# Patient Record
Sex: Female | Born: 1940 | Race: Black or African American | Hispanic: No | Smoking: Never smoker
Health system: Southern US, Community
[De-identification: ages and names within clinical notes are randomized; demographics above are authoritative.]

## PROBLEM LIST (undated history)

## (undated) DIAGNOSIS — G43909 Migraine, unspecified, not intractable, without status migrainosus: Secondary | ICD-10-CM

## (undated) DIAGNOSIS — I10 Essential (primary) hypertension: Secondary | ICD-10-CM

## (undated) DIAGNOSIS — E785 Hyperlipidemia, unspecified: Secondary | ICD-10-CM

## (undated) DIAGNOSIS — K219 Gastro-esophageal reflux disease without esophagitis: Secondary | ICD-10-CM

## (undated) DIAGNOSIS — K449 Diaphragmatic hernia without obstruction or gangrene: Secondary | ICD-10-CM

## (undated) HISTORY — PX: ABDOMINAL HYSTERECTOMY: SHX81

## (undated) HISTORY — PX: APPENDECTOMY: SHX54

## (undated) HISTORY — PX: COLON SURGERY: SHX602

## (undated) HISTORY — PX: ABDOMINAL SURGERY: SHX537

---

## 2014-05-26 ENCOUNTER — Inpatient Hospital Stay (HOSPITAL_COMMUNITY)
Admission: EM | Admit: 2014-05-26 | Discharge: 2014-05-30 | DRG: 103 | Disposition: A | Discharge status: Home / Self Care | Payer: Medicare Other | Attending: Internal Medicine | Admitting: Internal Medicine

## 2014-05-26 ENCOUNTER — Encounter (HOSPITAL_COMMUNITY): Payer: Self-pay | Admitting: Emergency Medicine

## 2014-05-26 ENCOUNTER — Emergency Department (HOSPITAL_COMMUNITY): Payer: Medicare Other

## 2014-05-26 DIAGNOSIS — K219 Gastro-esophageal reflux disease without esophagitis: Secondary | ICD-10-CM | POA: Diagnosis present

## 2014-05-26 DIAGNOSIS — I10 Essential (primary) hypertension: Secondary | ICD-10-CM | POA: Diagnosis present

## 2014-05-26 DIAGNOSIS — G43001 Migraine without aura, not intractable, with status migrainosus: Secondary | ICD-10-CM | POA: Insufficient documentation

## 2014-05-26 DIAGNOSIS — R51 Headache: Secondary | ICD-10-CM

## 2014-05-26 DIAGNOSIS — Z90710 Acquired absence of both cervix and uterus: Secondary | ICD-10-CM

## 2014-05-26 DIAGNOSIS — R519 Headache, unspecified: Secondary | ICD-10-CM

## 2014-05-26 DIAGNOSIS — E785 Hyperlipidemia, unspecified: Secondary | ICD-10-CM | POA: Diagnosis present

## 2014-05-26 DIAGNOSIS — Z882 Allergy status to sulfonamides status: Secondary | ICD-10-CM

## 2014-05-26 DIAGNOSIS — G8929 Other chronic pain: Secondary | ICD-10-CM

## 2014-05-26 DIAGNOSIS — R079 Chest pain, unspecified: Secondary | ICD-10-CM

## 2014-05-26 DIAGNOSIS — R1310 Dysphagia, unspecified: Secondary | ICD-10-CM | POA: Diagnosis present

## 2014-05-26 DIAGNOSIS — I160 Hypertensive urgency: Secondary | ICD-10-CM

## 2014-05-26 DIAGNOSIS — G43009 Migraine without aura, not intractable, without status migrainosus: Secondary | ICD-10-CM | POA: Diagnosis not present

## 2014-05-26 DIAGNOSIS — Z7982 Long term (current) use of aspirin: Secondary | ICD-10-CM

## 2014-05-26 HISTORY — DX: Diaphragmatic hernia without obstruction or gangrene: K44.9

## 2014-05-26 HISTORY — DX: Gastro-esophageal reflux disease without esophagitis: K21.9

## 2014-05-26 HISTORY — DX: Hyperlipidemia, unspecified: E78.5

## 2014-05-26 HISTORY — DX: Migraine, unspecified, not intractable, without status migrainosus: G43.909

## 2014-05-26 HISTORY — DX: Essential (primary) hypertension: I10

## 2014-05-26 LAB — CBC WITH DIFFERENTIAL/PLATELET
BASOS ABS: 0 10*3/uL (ref 0.0–0.1)
BASOS PCT: 0 % (ref 0–1)
Eosinophils Absolute: 0.3 10*3/uL (ref 0.0–0.7)
Eosinophils Relative: 4 % (ref 0–5)
HEMATOCRIT: 36.3 % (ref 36.0–46.0)
HEMOGLOBIN: 11.3 g/dL — AB (ref 12.0–15.0)
Lymphocytes Relative: 43 % (ref 12–46)
Lymphs Abs: 3.5 10*3/uL (ref 0.7–4.0)
MCH: 25.7 pg — ABNORMAL LOW (ref 26.0–34.0)
MCHC: 31.1 g/dL (ref 30.0–36.0)
MCV: 82.5 fL (ref 78.0–100.0)
MONO ABS: 0.7 10*3/uL (ref 0.1–1.0)
Monocytes Relative: 9 % (ref 3–12)
NEUTROS ABS: 3.5 10*3/uL (ref 1.7–7.7)
Neutrophils Relative %: 44 % (ref 43–77)
Platelets: 219 10*3/uL (ref 150–400)
RBC: 4.4 MIL/uL (ref 3.87–5.11)
RDW: 16.5 % — AB (ref 11.5–15.5)
WBC: 8.1 10*3/uL (ref 4.0–10.5)

## 2014-05-26 LAB — I-STAT CHEM 8, ED
BUN: 22 mg/dL (ref 6–23)
CREATININE: 0.9 mg/dL (ref 0.50–1.10)
Calcium, Ion: 1.24 mmol/L (ref 1.13–1.30)
Chloride: 103 mmol/L (ref 96–112)
Glucose, Bld: 106 mg/dL — ABNORMAL HIGH (ref 70–99)
HEMATOCRIT: 40 % (ref 36.0–46.0)
Hemoglobin: 13.6 g/dL (ref 12.0–15.0)
POTASSIUM: 4.3 mmol/L (ref 3.5–5.1)
Sodium: 143 mmol/L (ref 135–145)
TCO2: 20 mmol/L (ref 0–100)

## 2014-05-26 LAB — I-STAT TROPONIN, ED: Troponin i, poc: 0 ng/mL (ref 0.00–0.08)

## 2014-05-26 MED ORDER — NITROGLYCERIN 0.4 MG SL SUBL
0.4000 mg | SUBLINGUAL_TABLET | SUBLINGUAL | Status: DC | PRN
  Administered 2014-05-27: 0.4 mg via SUBLINGUAL
  Filled 2014-05-26: qty 1

## 2014-05-26 MED ORDER — PROMETHAZINE HCL 25 MG/ML IJ SOLN
12.5000 mg | Freq: Once | INTRAMUSCULAR | Status: AC
  Administered 2014-05-27: 12.5 mg via INTRAVENOUS
  Filled 2014-05-26: qty 1

## 2014-05-26 MED ORDER — SODIUM CHLORIDE 0.9 % IV BOLUS (SEPSIS)
1000.0000 mL | Freq: Once | INTRAVENOUS | Status: AC
  Administered 2014-05-27: 1000 mL via INTRAVENOUS

## 2014-05-26 MED ORDER — MORPHINE SULFATE 4 MG/ML IJ SOLN
4.0000 mg | Freq: Once | INTRAMUSCULAR | Status: AC
Start: 2014-05-26 — End: 2014-05-27
  Administered 2014-05-27: 4 mg via INTRAVENOUS
  Filled 2014-05-26: qty 1

## 2014-05-26 NOTE — ED Provider Notes (Signed)
CSN: [id number]     Arrival date & time 05/26/14  2201 History   First MD Initiated Contact with Patient 05/26/14 2259     This chart was scribed for Everlene Balls, MD by Forrestine Him, ED Scribe. This patient was seen in room A10C/A10C and the patient's care was started 11:15 PM.   Chief Complaint  Patient presents with  . Headache   HPI   HPI Comments: Kaitrin Seybold is a 74 y.o. female with a PMHx of GERD, hiatal hernia, and migraines who presents to the Emergency Department complaining of constant, ongoing, non-radiating substernal chest pain x 4 days. Pain is described as burning. Discomfort is exacerbated with eating and swallowing. Ms. Zobel attributes pain to GERD flare up as symptoms feel similar to episodes in the past., She also reports ongoing, constant HA x 2 days. Blood pressure has recently been high, however, pt is currently on medication to help manage readings. Pt mentions new onset, constant, mild R sided shoulder pain. She has tried prescribed pain medication which only provided 2 hours of improvement. She denies any SOB, diaphoresis, or vomiting. PSHx includes abdominal hysterectomy, colon surgery, and abdominal surgery- last in 2004. Pt with known allergies to Sulfa antibiotics.  Past Medical History  Diagnosis Date  . GERD (gastroesophageal reflux disease)   . Hiatal hernia   . Migraine    Past Surgical History  Procedure Laterality Date  . Colon surgery    . Abdominal surgery    . Abdominal hysterectomy     No family history on file. History  Substance Use Topics  . Smoking status: Never Smoker   . Smokeless tobacco: Not on file  . Alcohol Use: No   OB History    No data available     Review of Systems  Constitutional: Negative for fever, chills and diaphoresis.  Respiratory: Negative for cough and shortness of breath.   Cardiovascular: Positive for chest pain.  Gastrointestinal: Negative for vomiting.  Neurological: Positive for headaches. Negative for  weakness and numbness.  All other systems reviewed and are negative.     Allergies  Sulfa antibiotics  Home Medications   Prior to Admission medications   Medication Sig Start Date End Date Taking? Authorizing Provider  amLODipine (NORVASC) 5 MG tablet Take 5 mg by mouth daily.   Yes Historical Provider, MD  aspirin 81 MG tablet Take 81 mg by mouth daily.   Yes Historical Provider, MD  ASPIRIN-CAFFEINE PO Take 1 tablet by mouth every 4 (four) hours as needed. For headache   Yes Historical Provider, MD  metoprolol tartrate (LOPRESSOR) 25 MG tablet Take 25 mg by mouth 2 (two) times daily.   Yes Historical Provider, MD  NIFEdipine (PROCARDIA XL/ADALAT-CC) 90 MG 24 hr tablet Take 90 mg by mouth daily.   Yes Historical Provider, MD  omeprazole (PRILOSEC) 20 MG capsule Take 20 mg by mouth daily.   Yes Historical Provider, MD  polyethylene glycol (MIRALAX / GLYCOLAX) packet Take 17 g by mouth daily as needed for mild constipation.   Yes Historical Provider, MD  simvastatin (ZOCOR) 20 MG tablet Take 20 mg by mouth at bedtime.   Yes Historical Provider, MD  valsartan-hydrochlorothiazide (DIOVAN-HCT) 320-25 MG per tablet Take 1 tablet by mouth daily.   Yes Historical Provider, MD   Triage Vitals: BP 231/93 mmHg  Pulse 65  Temp(Src) 98.4 F (36.9 C)  Resp 20  Wt 154 lb (69.854 kg)  SpO2 97%   Physical Exam  Constitutional: She is  oriented to person, place, and time. She appears well-developed and well-nourished. No distress.  HENT:  Head: Normocephalic and atraumatic.  Eyes: EOM are normal.  Neck: Normal range of motion.  Cardiovascular: Normal rate, regular rhythm and normal heart sounds.   Pulmonary/Chest: Effort normal and breath sounds normal.  Abdominal: Soft. She exhibits no distension. There is no tenderness.  Musculoskeletal: Normal range of motion.  Neurological: She is alert and oriented to person, place, and time. A cranial nerve deficit is present. She exhibits normal muscle  tone.  History of Bells palsy, normal strength and sensation x 4 ext, normal cerebellar testing.  Skin: Skin is warm and dry.  Psychiatric: She has a normal mood and affect. Judgment normal.  Nursing note and vitals reviewed.   ED Course  Procedures (including critical care time)  DIAGNOSTIC STUDIES: Oxygen Saturation is 97% on RA, adequate by my interpretation.    COORDINATION OF CARE: 11:13 PM-Discussed treatment plan with pt at bedside and pt agreed to plan.     Labs Review Labs Reviewed  CBC WITH DIFFERENTIAL/PLATELET - Abnormal; Notable for the following:    Hemoglobin 11.3 (*)    MCH 25.7 (*)    RDW 16.5 (*)    All other components within normal limits  COMPREHENSIVE METABOLIC PANEL - Abnormal; Notable for the following:    Glucose, Bld 104 (*)    Total Protein 5.8 (*)    Albumin 3.1 (*)    GFR calc non Af Amer 68 (*)    GFR calc Af Amer 79 (*)    All other components within normal limits  CBC WITH DIFFERENTIAL/PLATELET - Abnormal; Notable for the following:    Hemoglobin 10.7 (*)    HCT 34.5 (*)    MCH 25.7 (*)    RDW 16.6 (*)    Neutrophils Relative % 37 (*)    Lymphocytes Relative 48 (*)    All other components within normal limits  I-STAT CHEM 8, ED - Abnormal; Notable for the following:    Glucose, Bld 106 (*)    All other components within normal limits  MRSA PCR SCREENING  MRSA PCR SCREENING  TROPONIN I  TROPONIN I  TROPONIN I  URINE RAPID DRUG SCREEN (HOSP PERFORMED)  CBC  I-STAT TROPOININ, ED    Imaging Review Dg Chest 2 View  05/26/2014   CLINICAL DATA:  Patient with mid chest pain since Wednesday evening.  EXAM: CHEST  2 VIEW  COMPARISON:  Chest radiograph 09/02/2013  FINDINGS: Stable cardiac and mediastinal contours. No consolidative pulmonary opacities. No pleural effusion or pneumothorax.  IMPRESSION: No acute cardiopulmonary process.   Electronically Signed   By: Lovey Newcomer M.D.   On: 05/26/2014 23:08     EKG Interpretation   Date/Time:   Monday May 26 2014 22:17:30 EDT Ventricular Rate:  66 PR Interval:  154 QRS Duration: 80 QT Interval:  414 QTC Calculation: 434 R Axis:   79 Text Interpretation:  Normal sinus rhythm Moderate voltage criteria for  LVH, may be normal variant Nonspecific ST abnormality Abnormal ECG  Confirmed by Glynn Octave [phone]) on 05/26/2014 10:57:49 PM      MDM   Final diagnoses:  None   Patient  Presents emergency department for elevated blood pressures, headaches, chest pain. She states her chest pain is consistent with her esophageal pain which she has had for many years. She is currently on medications which does not relieve her pain. She states her blood pressure has fluctuated frequently and  has been as high as 944 systolic. Patient is currently still experiencing headache and chest pain. She will require admission for blood pressure control. Troponin here is negative. Patient was given morphine and Phenergan which she states helps her GERD. She also be given nitroglycerin for her blood pressure.  Her BP continues to be high despite therapy.  I spoke with Dr. Raliegh Ip with [facility] who will give hydralazine for further treatment.  She will be admitted for elevated blood pressure, chest pain and headache.   I personally performed the services described in this documentation, which was scribed in my presence. The recorded information has been reviewed and is accurate.   Everlene Balls, MD 05/27/14 [phone]

## 2014-05-26 NOTE — ED Notes (Signed)
MD at bedside. 

## 2014-05-26 NOTE — ED Notes (Signed)
Pt c/o headache x's 6 days   Also c/o pain in her upper chest st's it is GERD.  C/o pain with swallowing

## 2014-05-27 ENCOUNTER — Encounter (HOSPITAL_COMMUNITY): Payer: Self-pay | Admitting: Internal Medicine

## 2014-05-27 ENCOUNTER — Inpatient Hospital Stay (HOSPITAL_COMMUNITY): Payer: Medicare Other

## 2014-05-27 DIAGNOSIS — R1310 Dysphagia, unspecified: Secondary | ICD-10-CM | POA: Diagnosis present

## 2014-05-27 DIAGNOSIS — G43001 Migraine without aura, not intractable, with status migrainosus: Secondary | ICD-10-CM | POA: Insufficient documentation

## 2014-05-27 DIAGNOSIS — K219 Gastro-esophageal reflux disease without esophagitis: Secondary | ICD-10-CM | POA: Diagnosis present

## 2014-05-27 DIAGNOSIS — R079 Chest pain, unspecified: Secondary | ICD-10-CM | POA: Diagnosis not present

## 2014-05-27 DIAGNOSIS — Z882 Allergy status to sulfonamides status: Secondary | ICD-10-CM | POA: Diagnosis not present

## 2014-05-27 DIAGNOSIS — I10 Essential (primary) hypertension: Secondary | ICD-10-CM | POA: Diagnosis present

## 2014-05-27 DIAGNOSIS — G4452 New daily persistent headache (NDPH): Secondary | ICD-10-CM | POA: Diagnosis not present

## 2014-05-27 DIAGNOSIS — E785 Hyperlipidemia, unspecified: Secondary | ICD-10-CM | POA: Diagnosis present

## 2014-05-27 DIAGNOSIS — Z7982 Long term (current) use of aspirin: Secondary | ICD-10-CM | POA: Diagnosis not present

## 2014-05-27 DIAGNOSIS — G43009 Migraine without aura, not intractable, without status migrainosus: Secondary | ICD-10-CM | POA: Diagnosis present

## 2014-05-27 DIAGNOSIS — R519 Headache, unspecified: Secondary | ICD-10-CM | POA: Diagnosis present

## 2014-05-27 DIAGNOSIS — R51 Headache: Secondary | ICD-10-CM

## 2014-05-27 DIAGNOSIS — I160 Hypertensive urgency: Secondary | ICD-10-CM | POA: Diagnosis present

## 2014-05-27 DIAGNOSIS — Z90710 Acquired absence of both cervix and uterus: Secondary | ICD-10-CM | POA: Diagnosis not present

## 2014-05-27 LAB — CBC WITH DIFFERENTIAL/PLATELET
BASOS ABS: 0 10*3/uL (ref 0.0–0.1)
Basophils Relative: 1 % (ref 0–1)
EOS PCT: 5 % (ref 0–5)
Eosinophils Absolute: 0.3 10*3/uL (ref 0.0–0.7)
HCT: 34.5 % — ABNORMAL LOW (ref 36.0–46.0)
Hemoglobin: 10.7 g/dL — ABNORMAL LOW (ref 12.0–15.0)
LYMPHS ABS: 2.8 10*3/uL (ref 0.7–4.0)
LYMPHS PCT: 48 % — AB (ref 12–46)
MCH: 25.7 pg — AB (ref 26.0–34.0)
MCHC: 31 g/dL (ref 30.0–36.0)
MCV: 82.7 fL (ref 78.0–100.0)
Monocytes Absolute: 0.5 10*3/uL (ref 0.1–1.0)
Monocytes Relative: 9 % (ref 3–12)
Neutro Abs: 2.1 10*3/uL (ref 1.7–7.7)
Neutrophils Relative %: 37 % — ABNORMAL LOW (ref 43–77)
PLATELETS: 210 10*3/uL (ref 150–400)
RBC: 4.17 MIL/uL (ref 3.87–5.11)
RDW: 16.6 % — ABNORMAL HIGH (ref 11.5–15.5)
WBC: 5.7 10*3/uL (ref 4.0–10.5)

## 2014-05-27 LAB — COMPREHENSIVE METABOLIC PANEL
ALBUMIN: 3.1 g/dL — AB (ref 3.5–5.2)
ALK PHOS: 108 U/L (ref 39–117)
ALT: 19 U/L (ref 0–35)
ANION GAP: 5 (ref 5–15)
AST: 25 U/L (ref 0–37)
BILIRUBIN TOTAL: 0.6 mg/dL (ref 0.3–1.2)
BUN: 16 mg/dL (ref 6–23)
CHLORIDE: 109 mmol/L (ref 96–112)
CO2: 26 mmol/L (ref 19–32)
Calcium: 8.8 mg/dL (ref 8.4–10.5)
Creatinine, Ser: 0.83 mg/dL (ref 0.50–1.10)
GFR calc Af Amer: 79 mL/min — ABNORMAL LOW (ref >90)
GFR calc non Af Amer: 68 mL/min — ABNORMAL LOW (ref >90)
Glucose, Bld: 104 mg/dL — ABNORMAL HIGH (ref 70–99)
POTASSIUM: 4.1 mmol/L (ref 3.5–5.1)
Sodium: 140 mmol/L (ref 135–145)
Total Protein: 5.8 g/dL — ABNORMAL LOW (ref 6.0–8.3)

## 2014-05-27 LAB — MRSA PCR SCREENING: MRSA BY PCR: NEGATIVE

## 2014-05-27 LAB — RAPID URINE DRUG SCREEN, HOSP PERFORMED
AMPHETAMINES: NOT DETECTED
Barbiturates: POSITIVE — AB
Benzodiazepines: NOT DETECTED
Cocaine: NOT DETECTED
OPIATES: POSITIVE — AB
Tetrahydrocannabinol: NOT DETECTED

## 2014-05-27 LAB — TROPONIN I
Troponin I: <0.03 ng/mL (ref <0.031)
Troponin I: <0.03 ng/mL (ref <0.031)

## 2014-05-27 MED ORDER — VALSARTAN-HYDROCHLOROTHIAZIDE 320-25 MG PO TABS: 1.0000 | ORAL_TABLET | Freq: Every day | ORAL | Status: DC

## 2014-05-27 MED ORDER — SUMATRIPTAN SUCCINATE 6 MG/0.5ML ~~LOC~~ SOLN
6.0000 mg | Freq: Once | SUBCUTANEOUS | Status: AC
  Administered 2014-05-27: 6 mg via SUBCUTANEOUS
  Filled 2014-05-27: qty 0.5

## 2014-05-27 MED ORDER — HYDROCHLOROTHIAZIDE 25 MG PO TABS
25.0000 mg | ORAL_TABLET | Freq: Every day | ORAL | Status: DC
  Administered 2014-05-27 – 2014-05-30 (×4): 25 mg via ORAL
  Filled 2014-05-27 (×4): qty 1

## 2014-05-27 MED ORDER — METOPROLOL TARTRATE 25 MG PO TABS
25.0000 mg | ORAL_TABLET | Freq: Two times a day (BID) | ORAL | Status: DC
Start: 2014-05-27 — End: 2014-05-29
  Administered 2014-05-27 – 2014-05-29 (×5): 25 mg via ORAL
  Filled 2014-05-27 (×6): qty 1

## 2014-05-27 MED ORDER — SODIUM CHLORIDE 0.9 % IV SOLN
INTRAVENOUS | Status: DC
  Administered 2014-05-27: 18:00:00 via INTRAVENOUS

## 2014-05-27 MED ORDER — ALUM & MAG HYDROXIDE-SIMETH 200-200-20 MG/5ML PO SUSP
30.0000 mL | ORAL | Status: DC | PRN
  Administered 2014-05-27: 30 mL via ORAL
  Filled 2014-05-27: qty 30

## 2014-05-27 MED ORDER — PANTOPRAZOLE SODIUM 40 MG PO TBEC
40.0000 mg | DELAYED_RELEASE_TABLET | Freq: Two times a day (BID) | ORAL | Status: DC
  Administered 2014-05-27 – 2014-05-30 (×7): 40 mg via ORAL
  Filled 2014-05-27 (×7): qty 1

## 2014-05-27 MED ORDER — SUCRALFATE 1 GM/10ML PO SUSP
1.0000 g | Freq: Three times a day (TID) | ORAL | Status: DC
  Administered 2014-05-27 – 2014-05-30 (×12): 1 g via ORAL
  Filled 2014-05-27 (×15): qty 10

## 2014-05-27 MED ORDER — ONDANSETRON HCL 4 MG PO TABS: 4.0000 mg | ORAL_TABLET | Freq: Four times a day (QID) | ORAL | Status: DC | PRN

## 2014-05-27 MED ORDER — ACETAMINOPHEN 650 MG RE SUPP: 650.0000 mg | Freq: Four times a day (QID) | RECTAL | Status: DC | PRN

## 2014-05-27 MED ORDER — AMLODIPINE BESYLATE 5 MG PO TABS
5.0000 mg | ORAL_TABLET | Freq: Every day | ORAL | Status: DC
  Filled 2014-05-27: qty 1

## 2014-05-27 MED ORDER — IRBESARTAN 300 MG PO TABS
300.0000 mg | ORAL_TABLET | Freq: Every day | ORAL | Status: DC
  Filled 2014-05-27: qty 1

## 2014-05-27 MED ORDER — AMLODIPINE BESYLATE 5 MG PO TABS
5.0000 mg | ORAL_TABLET | Freq: Every day | ORAL | Status: DC
  Administered 2014-05-27 – 2014-05-29 (×3): 5 mg via ORAL
  Filled 2014-05-27 (×3): qty 1

## 2014-05-27 MED ORDER — ONDANSETRON HCL 4 MG/2ML IJ SOLN: 4.0000 mg | Freq: Four times a day (QID) | INTRAMUSCULAR | Status: DC | PRN

## 2014-05-27 MED ORDER — NIFEDIPINE ER OSMOTIC RELEASE 90 MG PO TB24: 90.0000 mg | ORAL_TABLET | Freq: Every day | ORAL | Status: DC

## 2014-05-27 MED ORDER — HYDRALAZINE HCL 20 MG/ML IJ SOLN
10.0000 mg | INTRAMUSCULAR | Status: DC | PRN
  Administered 2014-05-27 – 2014-05-29 (×2): 10 mg via INTRAVENOUS
  Filled 2014-05-27 (×2): qty 1

## 2014-05-27 MED ORDER — GI COCKTAIL ~~LOC~~
30.0000 mL | Freq: Three times a day (TID) | ORAL | Status: DC | PRN
  Administered 2014-05-27 – 2014-05-29 (×2): 30 mL via ORAL
  Filled 2014-05-27 (×3): qty 30

## 2014-05-27 MED ORDER — ACETAMINOPHEN 325 MG PO TABS
650.0000 mg | ORAL_TABLET | Freq: Four times a day (QID) | ORAL | Status: DC | PRN
  Administered 2014-05-28 – 2014-05-29 (×4): 650 mg via ORAL
  Filled 2014-05-27 (×4): qty 2

## 2014-05-27 MED ORDER — PANTOPRAZOLE SODIUM 40 MG IV SOLR
40.0000 mg | Freq: Two times a day (BID) | INTRAVENOUS | Status: DC
  Filled 2014-05-27 (×2): qty 40

## 2014-05-27 MED ORDER — SIMVASTATIN 20 MG PO TABS
20.0000 mg | ORAL_TABLET | Freq: Every day | ORAL | Status: DC
  Administered 2014-05-27 – 2014-05-29 (×3): 20 mg via ORAL
  Filled 2014-05-27 (×4): qty 1

## 2014-05-27 MED ORDER — PANTOPRAZOLE SODIUM 40 MG PO TBEC: 40.0000 mg | DELAYED_RELEASE_TABLET | Freq: Every day | ORAL | Status: DC

## 2014-05-27 MED ORDER — HYDRALAZINE HCL 20 MG/ML IJ SOLN
INTRAMUSCULAR | Status: AC
  Filled 2014-05-27: qty 1

## 2014-05-27 MED ORDER — HYDROCHLOROTHIAZIDE 25 MG PO TABS
25.0000 mg | ORAL_TABLET | Freq: Every day | ORAL | Status: DC
  Filled 2014-05-27: qty 1

## 2014-05-27 MED ORDER — IRBESARTAN 300 MG PO TABS
300.0000 mg | ORAL_TABLET | Freq: Every day | ORAL | Status: DC
  Administered 2014-05-27 – 2014-05-30 (×4): 300 mg via ORAL
  Filled 2014-05-27 (×4): qty 1

## 2014-05-27 MED ORDER — HYDRALAZINE HCL 20 MG/ML IJ SOLN
10.0000 mg | Freq: Once | INTRAMUSCULAR | Status: AC
  Administered 2014-05-27: 10 mg via INTRAVENOUS

## 2014-05-27 MED ORDER — MORPHINE SULFATE 2 MG/ML IJ SOLN
2.0000 mg | INTRAMUSCULAR | Status: DC | PRN
  Administered 2014-05-27: 2 mg via INTRAVENOUS
  Filled 2014-05-27: qty 1

## 2014-05-27 NOTE — Progress Notes (Signed)
Dr. Sherral Hammers notified of pt complaining of "really bad headache after that medicine" (imitrex). No new orders received at this time.  Will continue to monitor pt closely.

## 2014-05-27 NOTE — H&P (Signed)
[facility] History and Physical  Mandy Ortiz QQP:[id number] DOB: 1941-01-21 DOA: 05/26/2014  Referring physician: ER physician. PCP: No primary care provider on file. patient follows up at [facility].  Chief Complaint: Chest pain and headache.  HPI: Mandy Ortiz is a 74 y.o. female with history of hypertension, hyperlipidemia, esophageal strictures requiring dilation previously presents to the ER because of chest pain and headache. Patient states she has been having chest pain over the last 5 days which is retrosternal sometimes radiating into her right arm for last 5 days. Pain is mostly burning in sensation no relation to exertion and denies any nausea vomiting productive cough fever chills. Last 24 hours patient also has been having some frontal headache denies any visual symptoms focal deficits. In the ER patient was found her blood pressure more than 712 systolic and has been admitted for further management of her hypertensive urgency. Patient denies having missed her medications.   Review of Systems: As presented in the history of presenting illness, rest negative.  Past Medical History  Diagnosis Date  . GERD (gastroesophageal reflux disease)   . Hiatal hernia   . Migraine   . Hypertension   . Hyperlipidemia    Past Surgical History  Procedure Laterality Date  . Colon surgery    . Abdominal surgery    . Abdominal hysterectomy     Social History:  reports that she has never smoked. She does not have any smokeless tobacco history on file. She reports that she does not drink alcohol or use illicit drugs. Where does patient live home. Can patient participate in ADLs? Yes.  Allergies  Allergen Reactions  . Sulfa Antibiotics     Hives     Family History:  Family History  Problem Relation Age of Onset  . Hypertension Mother   . Hypertension Father   . Hypertension Brother       Prior to Admission medications   Medication Sig Start Date End Date Taking?  Authorizing Provider  amLODipine (NORVASC) 5 MG tablet Take 5 mg by mouth daily.   Yes Historical Provider, MD  aspirin 81 MG tablet Take 81 mg by mouth daily.   Yes Historical Provider, MD  ASPIRIN-CAFFEINE PO Take 1 tablet by mouth every 4 (four) hours as needed. For headache   Yes Historical Provider, MD  metoprolol tartrate (LOPRESSOR) 25 MG tablet Take 25 mg by mouth 2 (two) times daily.   Yes Historical Provider, MD  NIFEdipine (PROCARDIA XL/ADALAT-CC) 90 MG 24 hr tablet Take 90 mg by mouth daily.   Yes Historical Provider, MD  omeprazole (PRILOSEC) 20 MG capsule Take 20 mg by mouth daily.   Yes Historical Provider, MD  polyethylene glycol (MIRALAX / GLYCOLAX) packet Take 17 g by mouth daily as needed for mild constipation.   Yes Historical Provider, MD  simvastatin (ZOCOR) 20 MG tablet Take 20 mg by mouth at bedtime.   Yes Historical Provider, MD  valsartan-hydrochlorothiazide (DIOVAN-HCT) 320-25 MG per tablet Take 1 tablet by mouth daily.   Yes Historical Provider, MD    Physical Exam: Filed Vitals:   05/27/14 0000 05/27/14 0100 05/27/14 0130 05/27/14 0210  BP: 222/69 229/84 230/85 247/89  Pulse:  81 91   Temp:      Resp: 17 15  14   Weight:      SpO2:  99% 96% 97%     General:  Well-developed and nourished.  Eyes: Anicteric no pallor.  ENT: No discharge from the ears eyes nose and mouth.  Neck: No mass felt.  Cardiovascular: S1 and S2 heard.  Respiratory: No rhonchi or crepitations.  Abdomen: Soft nontender bowel sounds present.  Skin: No rash.  Musculoskeletal: No edema.  Psychiatric: Appears normal.  Neurologic: Alert awake oriented to time place and person. Moves all extremities 5 x 5. Perla positive. No facial asymmetry.  Labs on Admission:  Basic Metabolic Panel:  Recent Labs Lab 05/26/14 2243  NA 143  K 4.3  CL 103  GLUCOSE 106*  BUN 22  CREATININE 0.90   Liver Function Tests: No results for input(s): AST, ALT, ALKPHOS, BILITOT, PROT, ALBUMIN  in the last 168 hours. No results for input(s): LIPASE, AMYLASE in the last 168 hours. No results for input(s): AMMONIA in the last 168 hours. CBC:  Recent Labs Lab 05/26/14 2234 05/26/14 2243  WBC 8.1  --   NEUTROABS 3.5  --   HGB 11.3* 13.6  HCT 36.3 40.0  MCV 82.5  --   PLT 219  --    Cardiac Enzymes: No results for input(s): CKTOTAL, CKMB, CKMBINDEX, TROPONINI in the last 168 hours.  BNP (last 3 results) No results for input(s): BNP in the last 8760 hours.  ProBNP (last 3 results) No results for input(s): PROBNP in the last 8760 hours.  CBG: No results for input(s): GLUCAP in the last 168 hours.  Radiological Exams on Admission: Dg Chest 2 View  05/26/2014   CLINICAL DATA:  Patient with mid chest pain since Wednesday evening.  EXAM: CHEST  2 VIEW  COMPARISON:  Chest radiograph 09/02/2013  FINDINGS: Stable cardiac and mediastinal contours. No consolidative pulmonary opacities. No pleural effusion or pneumothorax.  IMPRESSION: No acute cardiopulmonary process.   Electronically Signed   By: Lovey Newcomer M.D.   On: 05/26/2014 23:08    EKG: Independently reviewed. Normal sinus rhythm with nonspecific ST-T changes.  Assessment/Plan Principal Problem:   Hypertensive urgency Active Problems:   Chest pain   Headache   Hyperlipidemia   1. Hypertensive urgency - patient states she has not missed her medications. At this time I have ordered hydralazine 10 mg when necessary and continue home medications. If patient's blood pressure does not improve with hydralazine may need to start Cardene on labetalol infusions. 2. Chest pain - patient does have a history of esophageal strictures. But at this time we'll probably rule out any ACS by cycling cardiac markers checking 2-D echo. I have also ordered to check blood pressure difference in both arms and if that is more than 20 difference and we will get CT angiogram to rule out dissection. I have also place patient on Protonix and GI  cocktail. 3. Headache - probably related to patient's blood pressure. Patient at this time appears nonfocal. If patient's headache does not improve with control of blood pressure will get CT head. 4. Hyperlipidemia - on statin.   DVT Prophylaxis SCDs.  Code Status: Full code.  Family Communication: Family at the bedside.  Disposition Plan: Admit to inpatient.    Camyah Pultz N. [facility] Pager [phone].  If 7PM-7AM, please contact night-coverage [email or web address] Password [email or web address] 05/27/2014, 2:27 AM

## 2014-05-27 NOTE — ED Notes (Signed)
MD (Oni) at bedside. 

## 2014-05-27 NOTE — Progress Notes (Signed)
NURSING PROGRESS NOTE  Mandy Ortiz [id number] Transfer Data: 05/27/2014 6:00 PM Attending Provider: Allie Bossier, MD PCP:No primary care provider on file. Code Status: FULL  Mandy Ortiz is a 74 y.o. female patient transferred from [place] -No acute distress noted.  -No complaints of shortness of breath.  -No complaints of chest pain.    Blood pressure 212/91, pulse 69, temperature 98.2 F (36.8 C), temperature source Oral, resp. rate 18, height 5\' 6"  (1.676 m), weight 68.5 kg (151 lb 0.2 oz), SpO2 97 %.   IV Fluids:  IV in place, occlusive dsg intact without redness, IV cath hand left, condition patent and no redness normal saline.   Allergies:  Sulfa antibiotics  Past Medical History:   has a past medical history of GERD (gastroesophageal reflux disease); Hiatal hernia; Migraine; Hypertension; and Hyperlipidemia.  Past Surgical History:   has past surgical history that includes Colon surgery; Abdominal surgery; and Abdominal hysterectomy.  Skin: intact  Patient/Family orientated to room. Information packet given to patient/family. Admission inpatient armband information verified with patient/family to include name and date of birth and placed on patient arm. Side rails up x 2, fall assessment and education completed with patient/family. Patient/family able to verbalize understanding of risk associated with falls and verbalized understanding to call for assistance before getting out of bed. Call light within reach. Patient/family able to voice and demonstrate understanding of unit orientation instructions.    Will continue to evaluate and treat per MD orders.  Wallie Renshaw, RN

## 2014-05-27 NOTE — Consult Note (Signed)
Reason for Consult: Noncardiac chest pain Referring Physician: [facility]  Mandy Ortiz HPI: This is a 74 year old female admitted for hypertensive urgency.  She presented with chest pain with a systolic BP >182.  Her blood pressure is under better control.  In the past she underwent EGDs at [facility] and dilations were performed.  She started having issues with her esophagus 30 years ago when she was in [place].  Currently she reports a burning sensation in her chest with PO intake.  She notices this more when she swallows Nexium.  This burning sensation is intermittent and she does not know what exacerbates the situation.  She may go long stretches without any symptoms.  Every 2-3 years, on average, she was dilated by her GI physician in [facility], but her physician retired.    Past Medical History  Diagnosis Date  . GERD (gastroesophageal reflux disease)   . Hiatal hernia   . Migraine   . Hypertension   . Hyperlipidemia     Past Surgical History  Procedure Laterality Date  . Colon surgery    . Abdominal surgery    . Abdominal hysterectomy      Family History  Problem Relation Age of Onset  . Hypertension Mother   . Hypertension Father   . Hypertension Brother     Social History:  reports that she has never smoked. She does not have any smokeless tobacco history on file. She reports that she does not drink alcohol or use illicit drugs.  Allergies:  Allergies  Allergen Reactions  . Sulfa Antibiotics     Hives     Medications:  Scheduled: . amLODipine  5 mg Oral Daily  . hydrochlorothiazide  25 mg Oral Daily  . irbesartan  300 mg Oral Daily  . metoprolol tartrate  25 mg Oral BID  . pantoprazole  40 mg Oral BID  . simvastatin  20 mg Oral QHS  . SUMAtriptan  6 mg Subcutaneous Once   Continuous: . sodium chloride Stopped (05/27/14 0230)    Results for orders placed or performed during the hospital encounter of 05/26/14 (from the past 24 hour(s))  CBC  with Differential     Status: Abnormal   Collection Time: 05/26/14 10:34 PM  Result Value Ref Range   WBC 8.1 4.0 - 10.5 K/uL   RBC 4.40 3.87 - 5.11 MIL/uL   Hemoglobin 11.3 (L) 12.0 - 15.0 g/dL   HCT 36.3 36.0 - 46.0 %   MCV 82.5 78.0 - 100.0 fL   MCH 25.7 (L) 26.0 - 34.0 pg   MCHC 31.1 30.0 - 36.0 g/dL   RDW 16.5 (H) 11.5 - 15.5 %   Platelets 219 150 - 400 K/uL   Neutrophils Relative % 44 43 - 77 %   Neutro Abs 3.5 1.7 - 7.7 K/uL   Lymphocytes Relative 43 12 - 46 %   Lymphs Abs 3.5 0.7 - 4.0 K/uL   Monocytes Relative 9 3 - 12 %   Monocytes Absolute 0.7 0.1 - 1.0 K/uL   Eosinophils Relative 4 0 - 5 %   Eosinophils Absolute 0.3 0.0 - 0.7 K/uL   Basophils Relative 0 0 - 1 %   Basophils Absolute 0.0 0.0 - 0.1 K/uL  I-Stat Troponin, ED (not at [facility])     Status: None   Collection Time: 05/26/14 10:41 PM  Result Value Ref Range   Troponin i, poc 0.00 0.00 - 0.08 ng/mL   Comment 3  I-Stat Chem 8, ED     Status: Abnormal   Collection Time: 05/26/14 10:43 PM  Result Value Ref Range   Sodium 143 135 - 145 mmol/L   Potassium 4.3 3.5 - 5.1 mmol/L   Chloride 103 96 - 112 mmol/L   BUN 22 6 - 23 mg/dL   Creatinine, Ser 0.90 0.50 - 1.10 mg/dL   Glucose, Bld 106 (H) 70 - 99 mg/dL   Calcium, Ion 1.24 1.13 - 1.30 mmol/L   TCO2 20 0 - 100 mmol/L   Hemoglobin 13.6 12.0 - 15.0 g/dL   HCT 40.0 36.0 - 46.0 %  MRSA PCR Screening     Status: None   Collection Time: 05/27/14  3:03 AM  Result Value Ref Range   MRSA by PCR NEGATIVE NEGATIVE  Troponin I (q 6hr x 3)     Status: None   Collection Time: 05/27/14  4:14 AM  Result Value Ref Range   Troponin I <0.03 <0.031 ng/mL  Troponin I (q 6hr x 3)     Status: None   Collection Time: 05/27/14 10:35 AM  Result Value Ref Range   Troponin I <0.03 <0.031 ng/mL  Comprehensive metabolic panel     Status: Abnormal   Collection Time: 05/27/14 10:35 AM  Result Value Ref Range   Sodium 140 135 - 145 mmol/L   Potassium 4.1 3.5 - 5.1 mmol/L    Chloride 109 96 - 112 mmol/L   CO2 26 19 - 32 mmol/L   Glucose, Bld 104 (H) 70 - 99 mg/dL   BUN 16 6 - 23 mg/dL   Creatinine, Ser 0.83 0.50 - 1.10 mg/dL   Calcium 8.8 8.4 - 10.5 mg/dL   Total Protein 5.8 (L) 6.0 - 8.3 g/dL   Albumin 3.1 (L) 3.5 - 5.2 g/dL   AST 25 0 - 37 U/L   ALT 19 0 - 35 U/L   Alkaline Phosphatase 108 39 - 117 U/L   Total Bilirubin 0.6 0.3 - 1.2 mg/dL   GFR calc non Af Amer 68 (L) >90 mL/min   GFR calc Af Amer 79 (L) >90 mL/min   Anion gap 5 5 - 15  CBC with Differential/Platelet     Status: Abnormal   Collection Time: 05/27/14 10:35 AM  Result Value Ref Range   WBC 5.7 4.0 - 10.5 K/uL   RBC 4.17 3.87 - 5.11 MIL/uL   Hemoglobin 10.7 (L) 12.0 - 15.0 g/dL   HCT 34.5 (L) 36.0 - 46.0 %   MCV 82.7 78.0 - 100.0 fL   MCH 25.7 (L) 26.0 - 34.0 pg   MCHC 31.0 30.0 - 36.0 g/dL   RDW 16.6 (H) 11.5 - 15.5 %   Platelets 210 150 - 400 K/uL   Neutrophils Relative % 37 (L) 43 - 77 %   Neutro Abs 2.1 1.7 - 7.7 K/uL   Lymphocytes Relative 48 (H) 12 - 46 %   Lymphs Abs 2.8 0.7 - 4.0 K/uL   Monocytes Relative 9 3 - 12 %   Monocytes Absolute 0.5 0.1 - 1.0 K/uL   Eosinophils Relative 5 0 - 5 %   Eosinophils Absolute 0.3 0.0 - 0.7 K/uL   Basophils Relative 1 0 - 1 %   Basophils Absolute 0.0 0.0 - 0.1 K/uL     Dg Chest 2 View  05/26/2014   CLINICAL DATA:  Patient with mid chest pain since Wednesday evening.  EXAM: CHEST  2 VIEW  COMPARISON:  Chest radiograph 09/02/2013  FINDINGS: Stable cardiac and mediastinal contours. No consolidative pulmonary opacities. No pleural effusion or pneumothorax.  IMPRESSION: No acute cardiopulmonary process.   Electronically Signed   By: Lovey Newcomer M.D.   On: 05/26/2014 23:08    ROS:  As stated above in the HPI otherwise negative.  Blood pressure 118/40, pulse 54, temperature 98.5 F (36.9 C), temperature source Oral, resp. rate 15, height 5\' 6"  (1.676 m), weight 68.5 kg (151 lb 0.2 oz), SpO2 98 %.    PE: Gen: NAD, Alert and Oriented HEENT:   [place]/AT, EOMI Neck: Supple, no LAD Lungs: CTA Bilaterally CV: RRR without M/G/R ABM: Soft, NTND, +BS Ext: No C/C/E  Assessment/Plan: 1) Noncardiac chest pain. 2) History of esophageal strictures. 3) GERD.   At this point dysphagia is not a problem for her, but she reports issues with chest burning.  She denies using sucralfate in the past. I will give her a try of the medication to see if this can help her in addition to her Protonix.  Plan: 1) Sucralfate QID suspension. 2) Continue with Protonix.  Muhamad Serano D 05/27/2014, 4:11 PM

## 2014-05-27 NOTE — Progress Notes (Signed)
  Echocardiogram 2D Echocardiogram has been performed.  Diamond Nickel 05/27/2014, 3:21 PM

## 2014-05-27 NOTE — ED Notes (Signed)
RN attempted IV start; 2nd RN to attempted IV start

## 2014-05-27 NOTE — Progress Notes (Addendum)
[place] TEAM 1 - Stepdown/ICU TEAM Progress Note  Mandy Ortiz JAS:[id number] DOB: 11-Nov-1940 DOA: 05/26/2014 PCP: No primary care provider on file.  Admit HPI / Brief Narrative: Mandy Ortiz is a 73 y.o. BF PMHx hypertension, hyperlipidemia, esophageal strictures requiring dilation previously presents to the ER because of chest pain and headache. Patient states she has been having chest pain over the last 5 days which is retrosternal sometimes radiating into her right arm for last 5 days. Pain is mostly burning in sensation no relation to exertion and denies any nausea vomiting productive cough fever chills. Last 24 hours patient also has been having some frontal headache denies any visual symptoms focal deficits. In the ER patient was found her blood pressure more than 419 systolic and has been admitted for further management of her hypertensive urgency. Patient denies having missed her medications.   HPI/Subjective: 4/5 A/O x 4 rates current CP 8/10, negative SOB, Negative    Assessment/Plan: Hypertensive urgency  -BP now controlled on current medication -Continue amlodipine 5 mg daily -Continue HCTZ 25 mg daily, Avapro 300 mg daily -Continue metoprolol 25 mg  BID  Chest pain -Most likely GI in nature; patient with history of strictures and multiple dilations per patient (unable to verify in care everywhere) -Consult GI -Troponin 3 negative -Echocardiogram pending -CT angiogram not necessary; BP in both arms<20 point difference. -Continue GI cocktail -GI consult pending have spoken with Dr. Carol Ada; EGD with dilation of strictures?  Migraine headache -Most likely triggered by patient's hypertensive urgency -Imitrex shot 1 ADDENDUM; patient with increasing headache pain with appropriate therapy obtain CT head without contrast, evaluate for CVA vs mass.  Hyperlipidemia  -continue Zocor 20 mg daily -Obtain lipid panel   Code Status: FULL Family Communication: no  family present at time of exam Disposition Plan: Resolution chest pain   Consultants: Dr. Carol Ada (GI) pending    Procedure/Significant Events:    Culture NA  Antibiotics: NA  DVT prophylaxis: SCD   Devices NA   LINES / TUBES:  NA    Continuous Infusions: . sodium chloride Stopped (05/27/14 0230)    Objective: VITAL SIGNS: Temp: 98.5 F (36.9 C) (04/05 1203) Temp Source: Oral (04/05 1203) BP: 118/40 mmHg (04/05 1203) Pulse Rate: 54 (04/05 1200) SPO2; FIO2:   Intake/Output Summary (Last 24 hours) at 05/27/14 1353 Last data filed at 05/27/14 1200  Gross per 24 hour  Intake   1480 ml  Output    200 ml  Net   1280 ml     Exam: General: A/O x 4 sitting comfortably in bed ( although rates CP 8/10),No acute respiratory distress Lungs: Clear to auscultation bilaterally without wheezes or crackles Cardiovascular: Regular rate and rhythm without murmur gallop or rub normal S1 and S2 Abdomen: Mildly tender Epigastric region, nondistended, soft, bowel sounds positive, no rebound, no ascites, no appreciable mass Extremities: No significant cyanosis, clubbing, or edema bilateral lower extremities  Data Reviewed: Basic Metabolic Panel:  Recent Labs Lab 05/26/14 2243 05/27/14 1035  NA 143 140  K 4.3 4.1  CL 103 109  CO2  --  26  GLUCOSE 106* 104*  BUN 22 16  CREATININE 0.90 0.83  CALCIUM  --  8.8   Liver Function Tests:  Recent Labs Lab 05/27/14 1035  AST 25  ALT 19  ALKPHOS 108  BILITOT 0.6  PROT 5.8*  ALBUMIN 3.1*   No results for input(s): LIPASE, AMYLASE in the last 168 hours. No results for input(s): AMMONIA  in the last 168 hours. CBC:  Recent Labs Lab 05/26/14 2234 05/26/14 2243 05/27/14 1035  WBC 8.1  --  5.7  NEUTROABS 3.5  --  2.1  HGB 11.3* 13.6 10.7*  HCT 36.3 40.0 34.5*  MCV 82.5  --  82.7  PLT 219  --  210   Cardiac Enzymes:  Recent Labs Lab 05/27/14 0414 05/27/14 1035  TROPONINI <0.03 <0.03   BNP  (last 3 results) No results for input(s): BNP in the last 8760 hours.  ProBNP (last 3 results) No results for input(s): PROBNP in the last 8760 hours.  CBG: No results for input(s): GLUCAP in the last 168 hours.  Recent Results (from the past 240 hour(s))  MRSA PCR Screening     Status: None   Collection Time: 05/27/14  3:03 AM  Result Value Ref Range Status   MRSA by PCR NEGATIVE NEGATIVE Final    Comment:        The GeneXpert MRSA Assay (FDA approved for NASAL specimens only), is one component of a comprehensive MRSA colonization surveillance program. It is not intended to diagnose MRSA infection nor to guide or monitor treatment for MRSA infections.      Studies:  Recent x-ray studies have been reviewed in detail by the Attending Physician  Scheduled Meds:  Scheduled Meds: . amLODipine  5 mg Oral Daily  . hydrochlorothiazide  25 mg Oral Daily  . irbesartan  300 mg Oral Daily  . metoprolol tartrate  25 mg Oral BID  . NIFEdipine  90 mg Oral Daily  . pantoprazole  40 mg Oral BID  . simvastatin  20 mg Oral QHS    Time spent on care of this patient: 40 mins   Mandy Ortiz, Geraldo Docker , MD  [facility] Office  [phone] Pager [phone]  On-Call/Text Page:      [email or web address]      password [email or web address]  If 7PM-7AM, please contact night-coverage [email or web address] Password [email or web address] 05/27/2014, 1:53 PM   LOS: 0 days   Care during the described time interval was provided by me .  I have reviewed this patient's available data, including medical history, events of note, physical examination, radiology studies and test results as part of my evaluation  Dia Crawford, MD [phone] Pager

## 2014-05-27 NOTE — Progress Notes (Addendum)
INITIAL NUTRITION ASSESSMENT  DOCUMENTATION CODES Per approved criteria  -Not Applicable   INTERVENTION: -Downgrade diet to dysphagia 2 consistency with chopped meats for ease of intake  NUTRITION DIAGNOSIS: Inadequate oral intake related to dysphagia as evidenced by diet hx PTA.   Goal: Pt will meet >90% of estimated nutritional needs  Monitor:  PO/supplement intake, labs, weight changes, I/O's  Reason for Assessment: MST=3  74 y.o. female  Admitting Dx: Hypertensive urgency  Mandy Ortiz is a 74 y.o. female with history of hypertension, hyperlipidemia, esophageal strictures requiring dilation previously presents to the ER because of chest pain and headache. Patient states she has been having chest pain over the last 5 days which is retrosternal sometimes radiating into her right arm for last 5 days. Pain is mostly burning in sensation no relation to exertion and denies any nausea vomiting productive cough fever chills. Last 24 hours patient also has been having some frontal headache denies any visual symptoms focal deficits. In the ER patient was found her blood pressure more than 710 systolic and has been admitted for further management of her hypertensive urgency. Patient denies having missed her medications.   ASSESSMENT: Pt admitted with hypertensive urgency.  Pt endorses poor appetite and weight loss approximately 2 weeks PTA. She reports difficulty swallowing, reveals food often "gets stuck" in her throat and causes a burning sensation. She has a longstanding hx of acid reflux. Pt reports that she tries to stay away from acidic and fried foods, as they aggravate her acid reflux. She also admits that she tries to drink more liquids with meals to help food go down and this method usually help ease the symptoms. She consumed 75% of her breakfast this morning- all except the sausage patty, which she revealed was too difficult for her to eat. She is agreeable to a diet downgrade,  reporting that chopped meats will likely help her to eat with less difficulty.  She reports UBW of 155#. She estimates she has lost 4# in the past two weeks due to decreased oral intake.  Noted pt with mild muscle depletion in lower extremities. Pt reports that she has been less active due to weakness and having plantar fascitis. Despite this, she tries to be very active when she is well and participates in many athletic activities at the local senior center (ex. Chair volleyball and bocce).  Educated pt on importance of good PO intake to promote healing.  Labs reviewed. Glucose: 106.   Nutrition Focused Physical Exam:  Subcutaneous Fat:  Orbital Region: WDL Upper Arm Region: WDL Thoracic and Lumbar Region: WDL  Muscle:  Temple Region: WDL Clavicle Bone Region: WDL Clavicle and Acromion Bone Region: WDL Scapular Bone Region: WDL Dorsal Hand: mild depletion Patellar Region: mild depletion Anterior Thigh Region: mild depletion Posterior Calf Region: WDL  Edema: none present  Height: Ht Readings from Last 1 Encounters:  05/27/14 5\' 6"  (1.676 m)    Weight: Wt Readings from Last 1 Encounters:  05/27/14 151 lb 0.2 oz (68.5 kg)    Ideal Body Weight: 130#  % Ideal Body Weight: 116%  Wt Readings from Last 10 Encounters:  05/27/14 151 lb 0.2 oz (68.5 kg)    Usual Body Weight: 155#  % Usual Body Weight: 97%  BMI:  Body mass index is 24.39 kg/(m^2). Normal weight range  Estimated Nutritional Needs: Kcal: 1700-1900 Protein: 75-85 grams Fluid: 1.7-1.9 L  Skin: WDL  Diet Order: Diet Heart Room service appropriate?: Yes; Fluid consistency:: Thin  EDUCATION NEEDS: -  Education needs addressed   Intake/Output Summary (Last 24 hours) at 05/27/14 0927 Last data filed at 05/27/14 0745  Gross per 24 hour  Intake   1000 ml  Output    200 ml  Net    800 ml    Last BM: 05/26/14  Labs:   Recent Labs Lab 05/26/14 2243  NA 143  K 4.3  CL 103  BUN 22  CREATININE 0.90   GLUCOSE 106*    CBG (last 3)  No results for input(s): GLUCAP in the last 72 hours.  Scheduled Meds: . amLODipine  5 mg Oral Daily  . hydrochlorothiazide  25 mg Oral Daily  . irbesartan  300 mg Oral Daily  . metoprolol tartrate  25 mg Oral BID  . NIFEdipine  90 mg Oral Daily  . pantoprazole (PROTONIX) IV  40 mg Intravenous Q12H  . simvastatin  20 mg Oral QHS    Continuous Infusions: . sodium chloride Stopped (05/27/14 0230)    Past Medical History  Diagnosis Date  . GERD (gastroesophageal reflux disease)   . Hiatal hernia   . Migraine   . Hypertension   . Hyperlipidemia     Past Surgical History  Procedure Laterality Date  . Colon surgery    . Abdominal surgery    . Abdominal hysterectomy      Mandy Ortiz A. Mandy Ortiz, RD, LDN, CDE Pager: [phone] After hours Pager: [phone]

## 2014-05-27 NOTE — Progress Notes (Signed)
Report received from [facility] on [facility] for patient to be transferred into 5w09

## 2014-05-27 NOTE — Care Management Note (Addendum)
    Page 1 of 1   05/29/2014     4:09:26 PM CARE MANAGEMENT NOTE 05/29/2014  Patient:  Mandy Ortiz, Mandy Ortiz   Account Number:  [id number]  Date Initiated:  05/27/2014  Documentation initiated by:  Elissa Hefty  Subjective/Objective Assessment:   adm w htn urgency     Action/Plan:   lives alone   Anticipated DC Date:     Anticipated DC Plan:  [place]         Choice offered to / List presented to:             Status of service:   Medicare Important Message given?  YES (If response is "NO", the following Medicare IM given date fields will be blank) Date Medicare IM given:  05/29/2014 Medicare IM given by:  Carles Collet Date Additional Medicare IM given:   Additional Medicare IM given by:    Discharge Disposition:    Per UR Regulation:  Reviewed for med. necessity/level of care/duration of stay  If discussed at [place] of Stay Meetings, dates discussed:    Comments:

## 2014-05-28 ENCOUNTER — Inpatient Hospital Stay (HOSPITAL_COMMUNITY): Payer: Medicare Other

## 2014-05-28 DIAGNOSIS — G4452 New daily persistent headache (NDPH): Secondary | ICD-10-CM

## 2014-05-28 LAB — SEDIMENTATION RATE: Sed Rate: 20 mm/hr (ref 0–22)

## 2014-05-28 MED ORDER — VALPROATE SODIUM 500 MG/5ML IV SOLN
1.0000 g | Freq: Once | INTRAVENOUS | Status: AC
  Administered 2014-05-28: 1000 mg via INTRAVENOUS
  Filled 2014-05-28: qty 10

## 2014-05-28 MED ORDER — ENOXAPARIN SODIUM 40 MG/0.4ML ~~LOC~~ SOLN
40.0000 mg | SUBCUTANEOUS | Status: DC
Start: 2014-05-28 — End: 2014-05-30
  Administered 2014-05-28 – 2014-05-29 (×2): 40 mg via SUBCUTANEOUS
  Filled 2014-05-28 (×3): qty 0.4

## 2014-05-28 MED ORDER — GADOBENATE DIMEGLUMINE 529 MG/ML IV SOLN
15.0000 mL | Freq: Once | INTRAVENOUS | Status: AC | PRN
  Administered 2014-05-28: 15 mL via INTRAVENOUS

## 2014-05-28 NOTE — Progress Notes (Signed)
Unassigned patient Subjective: Patient reports that she is feeling better this morning. No chest pain since last night. Currently reports headache.  Objective: Vital signs in last 24 hours: Temp:  [98.2 F (36.8 C)-98.7 F (37.1 C)] 98.2 F (36.8 C) (04/06 0540) Pulse Rate:  [54-69] 60 (04/06 0946) Resp:  [15-27] 18 (04/06 0540) BP: (118-212)/(40-91) 168/62 mmHg (04/06 0946) SpO2:  [95 %-99 %] 99 % (04/06 0540) Weight:  [158 lb 4.8 oz (71.804 kg)] 158 lb 4.8 oz (71.804 kg) (04/05 1801) Last BM Date: 05/26/14  Intake/Output from previous day: 04/05 0701 - 04/06 0700 In: 1020 [P.O.:1020] Out: 1450 [Urine:1450] Intake/Output this shift: Total I/O In: 200 [P.O.:200] Out: -   PE: Gen: NAD, Alert and Oriented HEENT: [place]/AT, EOMI Neck: Supple, no LAD Lungs: CTA Bilaterally CV: RRR without M/G/R ABM: Soft, NTND, +BS Ext: No C/C/E  Lab Results:  Recent Labs  05/26/14 2234 05/26/14 2243 05/27/14 1035  WBC 8.1  --  5.7  HGB 11.3* 13.6 10.7*  HCT 36.3 40.0 34.5*  PLT 219  --  210   BMET  Recent Labs  05/26/14 2243 05/27/14 1035  NA 143 140  K 4.3 4.1  CL 103 109  CO2  --  26  GLUCOSE 106* 104*  BUN 22 16  CREATININE 0.90 0.83  CALCIUM  --  8.8   LFT  Recent Labs  05/27/14 1035  PROT 5.8*  ALBUMIN 3.1*  AST 25  ALT 19  ALKPHOS 108  BILITOT 0.6    Studies/Results: Dg Chest 2 View  05/26/2014   CLINICAL DATA:  Patient with mid chest pain since Wednesday evening.  EXAM: CHEST  2 VIEW  COMPARISON:  Chest radiograph 09/02/2013  FINDINGS: Stable cardiac and mediastinal contours. No consolidative pulmonary opacities. No pleural effusion or pneumothorax.  IMPRESSION: No acute cardiopulmonary process.   Electronically Signed   By: Lovey Newcomer M.D.   On: 05/26/2014 23:08   Ct Head Wo Contrast  05/27/2014   CLINICAL DATA:  Persistent headache and hypertension  EXAM: CT HEAD WITHOUT CONTRAST  TECHNIQUE: Contiguous axial images were obtained from the base of the  skull through the vertex without intravenous contrast.  COMPARISON:  March 07, 2014  FINDINGS: The ventricles are normal in size and configuration. There is no mass, hemorrhage, extra-axial fluid collection, or midline shift. No focal gray-white compartment lesions are identified. No acute infarct evident. Calcification in the right basal ganglia region is stable. The bony calvarium appears intact. The mastoid air cells are clear. There is probable cerumen in each external auditory canal.  IMPRESSION: No intracranial mass, hemorrhage, or focal gray - white compartment lesions/ acute appearing infarct. Calcification in the right basal ganglia region is stable and felt to have benign etiology. Apparent cerumen in each external auditory canal.   Electronically Signed   By: Lowella Grip III M.D.   On: 05/27/2014 20:41    Medications:  I have reviewed the patient's current medications. Scheduled: . amLODipine  5 mg Oral Daily  . hydrochlorothiazide  25 mg Oral Daily  . irbesartan  300 mg Oral Daily  . metoprolol tartrate  25 mg Oral BID  . pantoprazole  40 mg Oral BID  . simvastatin  20 mg Oral QHS  . sucralfate  1 g Oral TID WC & HS   Continuous: . sodium chloride 10 mL/hr at 05/27/14 1825   Assessment/Plan:  Chest pain: 74 year old woman with past medical history of hiatal hernia, GERD, hypertension and migraines presented with  burning chest pain that was felt to be noncardiac. GI was consulted to evaluate for possible GI related chest pain. She was tried on a GI cocktail in addition to Carafate and Protonix. Plan -Continue with Protonix 40 mg twice daily by mouth. -Continue with a GI cocktail 3 times a day prn -Continue with Carafate 3 times a day with meals -She'll probably benefit from EGD as outpatient. > Follow-up can be arranged with Dr. Collene Mares in 2-3 weeks.  Hypertension: BP has improved to 168/62  Headache: Being evaluated by primary team.  Case discussed with my attending,  Dr. Collene Mares   LOS: 1 day   Signed:  Jessee Avers, MD PGY-3 Internal Medicine Teaching Service Pager: [phone] 05/28/2014, 10:52 AM   Patient is in radiology at the present time. She has had resolution of her chest pain with Carafate. Will sign off now. Please call as needed.  Juanita Craver, MD 05/614.

## 2014-05-28 NOTE — Progress Notes (Signed)
Radiology did not notify about CT scan results, Informed Dr. Baltazar Najjar to review CT scan results.

## 2014-05-28 NOTE — Progress Notes (Signed)
PATIENT DETAILS Name: Mandy Ortiz Age: 74 y.o. Sex: female Date of Birth: 07/24/1940 Admit Date: 05/26/2014 Admitting Physician Rise Patience, MD PCP:No primary care provider on file.  Subjective: Continues to have headache. No further chest pain.  Assessment/Plan: Principal Problem:   Hypertensive urgency: Blood pressure much better-but with still some fluctuations.Continue with HCTZ, Avapro and metoprolol. Will not make any additional changes today, will follow and reevaluate tomorrow.  Active Problems:   Headache: Ongoing since Sunday, has never resolved-does have a history of migraine headaches, but no response to Imitrex. Current headaches seem to be different than her usual migraine headaches. Given age, will check ESR/CRP, MRI brain. One dose of IV Depakote. If still having headaches with these measures, will formally consult neurology.    Chest pain: Suspect GI etiology. Cardiac enzymes negative. 2-D echocardiogram showed preserved ejection fraction without any wall motion abnormality. Continue PPI and Carafate. Follow    Hyperlipidemia: Continue statin      Gastroesophageal reflux disease: Continue with Carafate and PPI.  Disposition: Remain inpatient-home in the next 1-2 days once workup is completed   Antibiotics:  None    Anti-infectives    None      DVT Prophylaxis: Prophylactic Lovenox   Code Status: Full code  Family Communication None at bedside  Procedures:  None  CONSULTS:  None  MEDICATIONS: Scheduled Meds: . amLODipine  5 mg Oral Daily  . hydrochlorothiazide  25 mg Oral Daily  . irbesartan  300 mg Oral Daily  . metoprolol tartrate  25 mg Oral BID  . pantoprazole  40 mg Oral BID  . simvastatin  20 mg Oral QHS  . sucralfate  1 g Oral TID WC & HS  . valproate sodium  1 g Intravenous Once   Continuous Infusions: . sodium chloride 10 mL/hr at 05/27/14 1825   PRN Meds:.acetaminophen **OR** acetaminophen, gi cocktail,  hydrALAZINE, ondansetron **OR** ondansetron (ZOFRAN) IV    PHYSICAL EXAM: Vital signs in last 24 hours: Filed Vitals:   05/27/14 2338 05/28/14 0540 05/28/14 0646 05/28/14 0946  BP: 139/46 146/59  168/62  Pulse: 65 54 56 60  Temp:  98.2 F (36.8 C)    TempSrc:  Oral    Resp:  18    Height:      Weight:      SpO2:  99%      Weight change: 1.95 kg (4 lb 4.8 oz) Filed Weights   05/26/14 2219 05/27/14 0210 05/27/14 1801  Weight: 69.854 kg (154 lb) 68.5 kg (151 lb 0.2 oz) 71.804 kg (158 lb 4.8 oz)   Body mass index is 25.56 kg/(m^2).   Gen Exam: Awake and alert with clear speech.   Neck: Supple, No JVD.   Chest: B/L Clear.   CVS: S1 S2 Regular, no murmurs.  Abdomen: soft, BS +, non tender, non distended.  Extremities: no edema, lower extremities warm to touch. Neurologic: Non Focal.   Skin: No Rash.   Wounds: N/A.    Intake/Output from previous day:  Intake/Output Summary (Last 24 hours) at 05/28/14 1255 Last data filed at 05/28/14 0924  Gross per 24 hour  Intake    740 ml  Output   1250 ml  Net   -510 ml     LAB RESULTS: CBC  Recent Labs Lab 05/26/14 2234 05/26/14 2243 05/27/14 1035  WBC 8.1  --  5.7  HGB 11.3* 13.6 10.7*  HCT 36.3 40.0 34.5*  PLT 219  --  210  MCV 82.5  --  82.7  MCH 25.7*  --  25.7*  MCHC 31.1  --  31.0  RDW 16.5*  --  16.6*  LYMPHSABS 3.5  --  2.8  MONOABS 0.7  --  0.5  EOSABS 0.3  --  0.3  BASOSABS 0.0  --  0.0    Chemistries   Recent Labs Lab 05/26/14 2243 05/27/14 1035  NA 143 140  K 4.3 4.1  CL 103 109  CO2  --  26  GLUCOSE 106* 104*  BUN 22 16  CREATININE 0.90 0.83  CALCIUM  --  8.8    CBG: No results for input(s): GLUCAP in the last 168 hours.  GFR Estimated Creatinine Clearance: 61.3 mL/min (by C-G formula based on Cr of 0.83).  Coagulation profile No results for input(s): INR, PROTIME in the last 168 hours.  Cardiac Enzymes  Recent Labs Lab 05/27/14 0414 05/27/14 1035 05/27/14 1502  TROPONINI  <0.03 <0.03 <0.03    Invalid input(s): POCBNP No results for input(s): DDIMER in the last 72 hours. No results for input(s): HGBA1C in the last 72 hours. No results for input(s): CHOL, HDL, LDLCALC, TRIG, CHOLHDL, LDLDIRECT in the last 72 hours. No results for input(s): TSH, T4TOTAL, T3FREE, THYROIDAB in the last 72 hours.  Invalid input(s): FREET3 No results for input(s): VITAMINB12, FOLATE, FERRITIN, TIBC, IRON, RETICCTPCT in the last 72 hours. No results for input(s): LIPASE, AMYLASE in the last 72 hours.  Urine Studies No results for input(s): UHGB, CRYS in the last 72 hours.  Invalid input(s): UACOL, UAPR, USPG, UPH, UTP, UGL, UKET, UBIL, UNIT, UROB, ULEU, UEPI, UWBC, URBC, UBAC, CAST, UCOM, BILUA  MICROBIOLOGY: Recent Results (from the past 240 hour(s))  MRSA PCR Screening     Status: None   Collection Time: 05/27/14  3:03 AM  Result Value Ref Range Status   MRSA by PCR NEGATIVE NEGATIVE Final    Comment:        The GeneXpert MRSA Assay (FDA approved for NASAL specimens only), is one component of a comprehensive MRSA colonization surveillance program. It is not intended to diagnose MRSA infection nor to guide or monitor treatment for MRSA infections.     RADIOLOGY STUDIES/RESULTS: Dg Chest 2 View  05/26/2014   CLINICAL DATA:  Patient with mid chest pain since Wednesday evening.  EXAM: CHEST  2 VIEW  COMPARISON:  Chest radiograph 09/02/2013  FINDINGS: Stable cardiac and mediastinal contours. No consolidative pulmonary opacities. No pleural effusion or pneumothorax.  IMPRESSION: No acute cardiopulmonary process.   Electronically Signed   By: Drew  Davis M.D.   On: 05/26/2014 23:08   Ct Head Wo Contrast  05/27/2014   CLINICAL DATA:  Persistent headache and hypertension  EXAM: CT HEAD WITHOUT CONTRAST  TECHNIQUE: Contiguous axial images were obtained from the base of the skull through the vertex without intravenous contrast.  COMPARISON:  March 07, 2014  FINDINGS: The  ventricles are normal in size and configuration. There is no mass, hemorrhage, extra-axial fluid collection, or midline shift. No focal gray-white compartment lesions are identified. No acute infarct evident. Calcification in the right basal ganglia region is stable. The bony calvarium appears intact. The mastoid air cells are clear. There is probable cerumen in each external auditory canal.  IMPRESSION: No intracranial mass, hemorrhage, or focal gray - white compartment lesions/ acute appearing infarct. Calcification in the right basal ganglia region is stable and felt to have benign etiology. Apparent cerumen in each external auditory canal.     Electronically Signed   By: Lowella Grip III M.D.   On: 05/27/2014 20:41    Oren Binet, MD  [facility] Pager:[phone]  If 7PM-7AM, please contact night-coverage [email or web address] Password [email or web address] 05/28/2014, 12:55 PM   LOS: 1 day

## 2014-05-29 DIAGNOSIS — R51 Headache: Secondary | ICD-10-CM

## 2014-05-29 LAB — C-REACTIVE PROTEIN: CRP: <0.5 mg/dL — ABNORMAL LOW (ref <0.60)

## 2014-05-29 MED ORDER — KETOROLAC TROMETHAMINE 30 MG/ML IJ SOLN
30.0000 mg | Freq: Once | INTRAMUSCULAR | Status: AC
Start: 2014-05-29 — End: 2014-05-29
  Administered 2014-05-29: 30 mg via INTRAVENOUS
  Filled 2014-05-29: qty 1

## 2014-05-29 MED ORDER — DIPHENHYDRAMINE HCL 50 MG/ML IJ SOLN
12.5000 mg | Freq: Once | INTRAMUSCULAR | Status: AC
  Administered 2014-05-29: 12.5 mg via INTRAVENOUS
  Filled 2014-05-29: qty 1

## 2014-05-29 MED ORDER — PROCHLORPERAZINE EDISYLATE 5 MG/ML IJ SOLN
10.0000 mg | Freq: Once | INTRAMUSCULAR | Status: AC
  Administered 2014-05-29: 10 mg via INTRAVENOUS
  Filled 2014-05-29: qty 2

## 2014-05-29 MED ORDER — PROCHLORPERAZINE EDISYLATE 5 MG/ML IJ SOLN
10.0000 mg | Freq: Four times a day (QID) | INTRAMUSCULAR | Status: DC | PRN
  Filled 2014-05-29: qty 2

## 2014-05-29 MED ORDER — AMLODIPINE BESYLATE 10 MG PO TABS
10.0000 mg | ORAL_TABLET | Freq: Every day | ORAL | Status: DC
  Administered 2014-05-30: 10 mg via ORAL
  Filled 2014-05-29: qty 1

## 2014-05-29 MED ORDER — METOPROLOL TARTRATE 25 MG PO TABS
25.0000 mg | ORAL_TABLET | Freq: Two times a day (BID) | ORAL | Status: DC
  Administered 2014-05-29 – 2014-05-30 (×2): 25 mg via ORAL
  Filled 2014-05-29 (×3): qty 1

## 2014-05-29 MED ORDER — DIPHENHYDRAMINE HCL 50 MG/ML IJ SOLN: 12.5000 mg | Freq: Four times a day (QID) | INTRAMUSCULAR | Status: DC | PRN

## 2014-05-29 NOTE — Progress Notes (Signed)
PATIENT DETAILS Name: Mandy Ortiz Age: 74 y.o. Sex: female Date of Birth: 1941/01/20 Admit Date: 05/26/2014 Admitting Physician Rise Patience, MD PCP:No primary care provider on file.  Subjective: Continues to have headache. No further chest pain.  Assessment/Plan: Principal Problem:   Hypertensive urgency: Blood pressure much better-but with still some fluctuations.Continue with HCTZ, Avapro and metoprolol.Increase Amlodipine to 10 mg. Follow and titrate accordingly  Active Problems:   Headache: Ongoing since Sunday, has never resolved-does have a history of migraine headaches, but no response to Imitrex. Current headaches seem to be different than her usual migraine headaches. ESR only at 20. MRI brain negative. No response to 1 gm IV Depakote. Have consulted Neurology.     Chest pain: Suspect GI etiology. Cardiac enzymes negative. 2-D echocardiogram showed preserved ejection fraction without any wall motion abnormality. Continue PPI and Carafate. Follow    Hyperlipidemia: Continue statin      Gastroesophageal reflux disease: Continue with Carafate and PPI.  Disposition: Remain inpatient-home in the next 1-2 days once workup is completed   Antibiotics:  None    Anti-infectives    None      DVT Prophylaxis: Prophylactic Lovenox   Code Status: Full code  Family Communication None at bedside  Procedures:  None  CONSULTS:  None  MEDICATIONS: Scheduled Meds: . amLODipine  5 mg Oral Daily  . enoxaparin (LOVENOX) injection  40 mg Subcutaneous Q24H  . hydrochlorothiazide  25 mg Oral Daily  . irbesartan  300 mg Oral Daily  . metoprolol tartrate  25 mg Oral BID  . pantoprazole  40 mg Oral BID  . simvastatin  20 mg Oral QHS  . sucralfate  1 g Oral TID WC & HS   Continuous Infusions: . sodium chloride 10 mL/hr at 05/27/14 1825   PRN Meds:.acetaminophen **OR** acetaminophen, prochlorperazine **AND** diphenhydrAMINE, gi cocktail, hydrALAZINE,  ondansetron **OR** ondansetron (ZOFRAN) IV    PHYSICAL EXAM: Vital signs in last 24 hours: Filed Vitals:   05/28/14 1419 05/28/14 2214 05/29/14 0546 05/29/14 0956  BP: 136/50 146/47 167/54 144/53  Pulse: 56 51 49 79  Temp: 99.3 F (37.4 C) 98.2 F (36.8 C) 98.2 F (36.8 C)   TempSrc: Oral Oral Oral   Resp: 18 18 16    Height:      Weight:      SpO2: 96% 97% 97%     Weight change:  Filed Weights   05/26/14 2219 05/27/14 0210 05/27/14 1801  Weight: 69.854 kg (154 lb) 68.5 kg (151 lb 0.2 oz) 71.804 kg (158 lb 4.8 oz)   Body mass index is 25.56 kg/(m^2).   Gen Exam: Awake and alert with clear speech.   Neck: Supple, No JVD.   Chest: B/L Clear.   CVS: S1 S2 Regular, no murmurs.  Abdomen: soft, BS +, non tender, non distended.  Extremities: no edema, lower extremities warm to touch. Neurologic: Non Focal.   Skin: No Rash.   Wounds: N/A.    Intake/Output from previous day:  Intake/Output Summary (Last 24 hours) at 05/29/14 1219 Last data filed at 05/29/14 1132  Gross per 24 hour  Intake 768.17 ml  Output   1150 ml  Net -381.83 ml     LAB RESULTS: CBC  Recent Labs Lab 05/26/14 2234 05/26/14 2243 05/27/14 1035  WBC 8.1  --  5.7  HGB 11.3* 13.6 10.7*  HCT 36.3 40.0 34.5*  PLT 219  --  210  MCV 82.5  --  82.7  MCH 25.7*  --  25.7*  MCHC 31.1  --  31.0  RDW 16.5*  --  16.6*  LYMPHSABS 3.5  --  2.8  MONOABS 0.7  --  0.5  EOSABS 0.3  --  0.3  BASOSABS 0.0  --  0.0    Chemistries   Recent Labs Lab 05/26/14 2243 05/27/14 1035  NA 143 140  K 4.3 4.1  CL 103 109  CO2  --  26  GLUCOSE 106* 104*  BUN 22 16  CREATININE 0.90 0.83  CALCIUM  --  8.8    CBG: No results for input(s): GLUCAP in the last 168 hours.  GFR Estimated Creatinine Clearance: 61.3 mL/min (by C-G formula based on Cr of 0.83).  Coagulation profile No results for input(s): INR, PROTIME in the last 168 hours.  Cardiac Enzymes  Recent Labs Lab 05/27/14 0414 05/27/14 1035  05/27/14 1502  TROPONINI <0.03 <0.03 <0.03    Invalid input(s): POCBNP No results for input(s): DDIMER in the last 72 hours. No results for input(s): HGBA1C in the last 72 hours. No results for input(s): CHOL, HDL, LDLCALC, TRIG, CHOLHDL, LDLDIRECT in the last 72 hours. No results for input(s): TSH, T4TOTAL, T3FREE, THYROIDAB in the last 72 hours.  Invalid input(s): FREET3 No results for input(s): VITAMINB12, FOLATE, FERRITIN, TIBC, IRON, RETICCTPCT in the last 72 hours. No results for input(s): LIPASE, AMYLASE in the last 72 hours.  Urine Studies No results for input(s): UHGB, CRYS in the last 72 hours.  Invalid input(s): UACOL, UAPR, USPG, UPH, UTP, UGL, UKET, UBIL, UNIT, UROB, ULEU, UEPI, UWBC, URBC, UBAC, CAST, UCOM, BILUA  MICROBIOLOGY: Recent Results (from the past 240 hour(s))  MRSA PCR Screening     Status: None   Collection Time: 05/27/14  3:03 AM  Result Value Ref Range Status   MRSA by PCR NEGATIVE NEGATIVE Final    Comment:        The GeneXpert MRSA Assay (FDA approved for NASAL specimens only), is one component of a comprehensive MRSA colonization surveillance program. It is not intended to diagnose MRSA infection nor to guide or monitor treatment for MRSA infections.     RADIOLOGY STUDIES/RESULTS: Dg Chest 2 View  05/26/2014   CLINICAL DATA:  Patient with mid chest pain since Wednesday evening.  EXAM: CHEST  2 VIEW  COMPARISON:  Chest radiograph 09/02/2013  FINDINGS: Stable cardiac and mediastinal contours. No consolidative pulmonary opacities. No pleural effusion or pneumothorax.  IMPRESSION: No acute cardiopulmonary process.   Electronically Signed   By: Lovey Newcomer M.D.   On: 05/26/2014 23:08   Ct Head Wo Contrast  05/27/2014   CLINICAL DATA:  Persistent headache and hypertension  EXAM: CT HEAD WITHOUT CONTRAST  TECHNIQUE: Contiguous axial images were obtained from the base of the skull through the vertex without intravenous contrast.  COMPARISON:  March 07, 2014  FINDINGS: The ventricles are normal in size and configuration. There is no mass, hemorrhage, extra-axial fluid collection, or midline shift. No focal gray-white compartment lesions are identified. No acute infarct evident. Calcification in the right basal ganglia region is stable. The bony calvarium appears intact. The mastoid air cells are clear. There is probable cerumen in each external auditory canal.  IMPRESSION: No intracranial mass, hemorrhage, or focal gray - white compartment lesions/ acute appearing infarct. Calcification in the right basal ganglia region is stable and felt to have benign etiology. Apparent cerumen in each external auditory canal.   Electronically Signed   By: Gwyndolyn Saxon  Jasmine December III M.D.   On: 05/27/2014 20:41   Mr Jeri Cos JH Contrast  05/28/2014   CLINICAL DATA:  Persistent headache for 3 days. The current headache a is different than her typical migraines. Chest pain extending into the right upper extremity.  EXAM: MRI HEAD WITHOUT AND WITH CONTRAST  TECHNIQUE: Multiplanar, multiecho pulse sequences of the brain and surrounding structures were obtained without and with intravenous contrast.  CONTRAST:  41m MULTIHANCE GADOBENATE DIMEGLUMINE 529 MG/ML IV SOLN  COMPARISON:  CT head without contrast 05/27/2014.  FINDINGS: The diffusion-weighted images demonstrate no evidence for acute or subacute infarction. No acute hemorrhage or mass lesion is present. Scattered periventricular and predominantly subcortical T2 hyperintensities are evident bilaterally. 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 The ventricles are of normal size. No significant extra-axial fluid collection is present.  Flow is present in the major intracranial arteries. The globes and orbits are intact.  A polyp or mucous retention cyst is noted at the floor of the left maxillary sinus. There is mild mucosal thickening in both maxillary sinuses. Mild mucosal thickening is scattered  throughout the ethmoid air cells and in the inferior left frontal sinus. There are no fluid levels. Minimal fluid is present in the mastoid air cells bilaterally. No obstructing at nasopharyngeal lesion is evident.  And 8 mm fluid collection is noted anterior to the right TMJ, likely a synovial cyst.  Postcontrast images demonstrate no pathologic enhancement. The skullbase is unremarkable. Midline structures are within normal limits  IMPRESSION: 1. No acute or focal lesion to explain the patient's acute headache. 2. Scattered periventricular and predominantly subcortical T2 hyperintensities bilaterally likely reflect the sequela of chronic microvascular ischemia. 3. Mild sinus disease as described. 4. 8 mm cystic lesion anterior to the right TMJ likely represents a synovial cyst associated with degenerative change.   Electronically Signed   By: CSan MorelleM.D.   On: 05/28/2014 17:40    GOren Binet MD  [facility] Pager:[phone] If 7PM-7AM, please contact night-coverage [email or web address] Password [email or web address] 05/29/2014, 12:19 PM   LOS: 2 days

## 2014-05-29 NOTE — Consult Note (Signed)
Neurology Consultation Reason for Consult:  headache Referring Physician: Edgardo Roys  CC:  headache  History is obtained from: patient  HPI: Mandy Ortiz is a 74 y.o. female  With a history of migraines who presented with hypertension  and chest pain as well as headache. She states that she was having a typical migraine when she was admitted. This resolved, but since yesterday she has had return of headache. Typically when she goes to bed and wakes up the next day it is gone and in that way the current headache is different from her typical migraines , however otherwise it is similar. It is associated with photophobia. She denies nausea or vomiting. She denies diplopia.      ROS: A 14 point ROS was performed and is negative except as noted in the HPI.   Past Medical History  Diagnosis Date  . GERD (gastroesophageal reflux disease)   . Hiatal hernia   . Migraine   . Hypertension   . Hyperlipidemia     Family History:  no history of migraine  Social History: Tob:  denies  Exam: Current vital signs: BP 167/54 mmHg  Pulse 49  Temp(Src) 98.2 F (36.8 C) (Oral)  Resp 16  Ht 5' 6" (1.676 m)  Wt 71.804 kg (158 lb 4.8 oz)  BMI 25.56 kg/m2  SpO2 97% Vital signs in last 24 hours: Temp:  [98.2 F (36.8 C)-99.3 F (37.4 C)] 98.2 F (36.8 C) (04/07 0546) Pulse Rate:  [49-56] 49 (04/07 0546) Resp:  [16-18] 16 (04/07 0546) BP: (136-167)/(47-54) 167/54 mmHg (04/07 0546) SpO2:  [96 %-97 %] 97 % (04/07 0546)  Physical Exam  Constitutional: Appears well-developed and well-nourished.  Psych: Affect appropriate to situation Eyes: No scleral injection HENT: No OP obstrucion Head: Normocephalic.  Cardiovascular: Normal rate and regular rhythm.  Respiratory: Effort normal and breath sounds normal to anterior ascultation GI: Soft.  No distension. There is no tenderness.  Skin: WDI  Neuro: Mental Status: Patient is awake, alert, oriented to person, place, month, year, and  situation. Patient is able to give a clear and coherent history. No signs of aphasia or neglect Cranial Nerves: II: Visual Fields are full. Pupils are equal, round, and reactive to light.   III,IV, VI: EOMI without ptosis or diploplia.  V: Facial sensation is symmetric to temperature VII: Facial movement is symmetric.  VIII: hearing is intact to voice X: Uvula elevates symmetrically XI: Shoulder shrug is symmetric. XII: tongue is midline without atrophy or fasciculations.  Motor: Tone is normal. Bulk is normal. 5/5 strength was present in all four extremities.  Sensory: Sensation is symmetric to light touch and temperature in the arms and legs. Cerebellar: FNF  intact bilaterally         I have reviewed labs in epic and the results pertinent to this consultation are: ESR nml  I have reviewed the images obtained: MRI brain - no acute findings  Impression: 74 yo F with likely migraine headache.   Recommendations: 1) compazine/benadryl/toradol. This has been ordered.  2) will follow.    Roland Rack, MD [facility] [phone]  If 7pm- 7am, please page neurology on call as listed in [place].

## 2014-05-29 NOTE — Progress Notes (Signed)
Medicare Important Message given?  YES (If response is "NO", the following Medicare IM given date fields will be blank) Date Medicare IM given:  05/29/14 Medicare IM given by:  Tomi Bamberger

## 2014-05-30 MED ORDER — AMLODIPINE BESYLATE 10 MG PO TABS: 5.0000 mg | ORAL_TABLET | Freq: Every day | ORAL | Status: DC

## 2014-05-30 MED ORDER — METOPROLOL TARTRATE 12.5 MG HALF TABLET
12.5000 mg | ORAL_TABLET | Freq: Two times a day (BID) | ORAL | Status: DC
  Filled 2014-05-30: qty 1

## 2014-05-30 MED ORDER — METOPROLOL TARTRATE 25 MG PO TABS: 12.5000 mg | ORAL_TABLET | Freq: Two times a day (BID) | ORAL | Status: DC

## 2014-05-30 MED ORDER — AMLODIPINE BESYLATE 10 MG PO TABS: 10.0000 mg | ORAL_TABLET | Freq: Every day | ORAL | Status: AC

## 2014-05-30 MED ORDER — TRIAMTERENE-HCTZ 37.5-25 MG PO TABS
1.0000 | ORAL_TABLET | Freq: Every day | ORAL | Status: DC
  Filled 2014-05-30: qty 1

## 2014-05-30 MED ORDER — IRBESARTAN 300 MG PO TABS: 300.0000 mg | ORAL_TABLET | Freq: Every day | ORAL | Status: DC

## 2014-05-30 MED ORDER — PANTOPRAZOLE SODIUM 40 MG PO TBEC: 40.0000 mg | DELAYED_RELEASE_TABLET | Freq: Every day | ORAL | Status: AC

## 2014-05-30 MED ORDER — SIMVASTATIN 20 MG PO TABS: 20.0000 mg | ORAL_TABLET | Freq: Every day | ORAL | Status: AC

## 2014-05-30 MED ORDER — SUCRALFATE 1 GM/10ML PO SUSP: 1.0000 g | Freq: Three times a day (TID) | ORAL | Status: DC

## 2014-05-30 MED ORDER — TRIAMTERENE-HCTZ 37.5-25 MG PO TABS: 1.0000 | ORAL_TABLET | Freq: Every day | ORAL | Status: DC

## 2014-05-30 NOTE — Progress Notes (Signed)
  Pharmacy Discharge Medication Therapy Review   Total Number of meds on admission 9 (polypharmacy > 10 meds)  Indications for all medications: [x]  Yes       []  No  Adherence Review  []  Excellent (no doses missed/week)     [x]  Good (no more than 1 dose missed/week)     []  Partial (2-3 doses missed/week)     []  Poor (>3 doses missed/week)  Total number of high risk medications: 0 (Anticoagulants, Dual antiplatelets, oral Antihyperglycemic agents,Insulins, Antipsychotics, Anti-Seizure meds, Inhalers, HF/ACS meds, Antibiotics and HIV medications)   Assessment: (Medication related problems)  Intervention  YES NO  Explanation   Indications      Medication without noted indication []  [x]     Indication without noted medication [x]  []   Pt has been experiencing migraines PTA and while inpatient.  Neuro consulted, pt has received multiple different meds in an attempt to resolve.  No meds for migraines on discharge, but migraines may have been related to elevated BP.  Pt states she is no longer experiencing a migraine/headache today.   Duplicate therapy []  [x]    Efficacy      Suboptimal drug or dose selection [x]  []  Noted incorrect dose of amlodipine on DC summary. Discussed w/ provider and DC summary was updated.  Insufficient dose/duration []  [x]    Failure to receive therapy  (Rx not filled) []  [x]     Safety      Adverse drug event []  [x]     Drug interaction []  [x]     Excessive dose/duration []  [x]    High-risk medications []  [x]    Compliance     Underuse []  [x]     Overuse []  [x]    Other pertinent pharmacist counseling [x]  []   Discussed change in dose of amlodipine and metoprolol. Discussed change of PPI, taking on empty stomach, and lifestyle recommendations for reflux.    Total number of new medications upon discharge: 4  Time:  Time spent preparing for discharge counseling: 45 min Time spent counseling patient: 15 min   PLAN:  Consider the following at  discharge/Recommendations discussed with provider - Continue amlodipine 10mg  daily on discharge instead of lower PTA dose of 5mg  daily - I have thoroughly reviewed discharge meds w/ pt and changes from PTA meds - I separated her meds into separate bags so she will not continue taking any of her PTA meds that were discontinued on discharge  Drucie Opitz, PharmD Clinical Pharmacy Resident Pager: [phone] 05/30/2014 11:15 AM

## 2014-05-30 NOTE — Progress Notes (Signed)
NURSING PROGRESS NOTE  Mandy Ortiz [id number] Discharge Data: 05/30/2014 1:55 PM Attending Provider: Jonetta Osgood, MD CNO:[id number], MD     Tera Partridge to be D/C'd Home per MD order.  Discussed with the patient the After Visit Summary and all questions fully answered. All IV's discontinued with no bleeding noted. All belongings returned to patient for patient to take home.   Last Vital Signs:  Blood pressure 166/52, pulse 65, temperature 97.9 F (36.6 C), temperature source Oral, resp. rate 18, height 5\' 6"  (1.676 m), weight 71.804 kg (158 lb 4.8 oz), SpO2 98 %.  Discharge Medication List   Medication List    STOP taking these medications        ASPIRIN-CAFFEINE PO     NIFEdipine 90 MG 24 hr tablet  Commonly known as:  PROCARDIA XL/ADALAT-CC     omeprazole 20 MG capsule  Commonly known as:  PRILOSEC     valsartan-hydrochlorothiazide 320-25 MG per tablet  Commonly known as:  DIOVAN-HCT      TAKE these medications        amLODipine 10 MG tablet  Commonly known as:  NORVASC  Take 1 tablet (10 mg total) by mouth daily.     aspirin 81 MG tablet  Take 81 mg by mouth daily.     irbesartan 300 MG tablet  Commonly known as:  AVAPRO  Take 1 tablet (300 mg total) by mouth daily.     metoprolol tartrate 25 MG tablet  Commonly known as:  LOPRESSOR  Take 0.5 tablets (12.5 mg total) by mouth 2 (two) times daily.     pantoprazole 40 MG tablet  Commonly known as:  PROTONIX  Take 1 tablet (40 mg total) by mouth daily.     polyethylene glycol packet  Commonly known as:  MIRALAX / GLYCOLAX  Take 17 g by mouth daily as needed for mild constipation.     simvastatin 20 MG tablet  Commonly known as:  ZOCOR  Take 1 tablet (20 mg total) by mouth at bedtime.     sucralfate 1 GM/10ML suspension  Commonly known as:  CARAFATE  Take 10 mLs (1 g total) by mouth 4 (four) times daily -  with meals and at bedtime.     triamterene-hydrochlorothiazide 37.5-25 MG per tablet   Commonly known as:  MAXZIDE-25  Take 1 tablet by mouth daily.

## 2014-05-30 NOTE — Discharge Summary (Addendum)
PATIENT DETAILS Name: Mandy Ortiz Age: 74 y.o. Sex: female Date of Birth: 1941-01-26 MRN: [id number]. Admitting Physician: Rise Patience, MD WUJ:[email or web address],WGNFA, MD  Admit Date: 05/26/2014 Discharge date: 05/30/2014  Recommendations for Outpatient Follow-up:  1. Changes made to antihypertensive regimen-please optimize at next follow-up 2. If continues to have intermittent headaches, please refer to neurology  3. Age-appropriate general health maintenance  PRIMARY DISCHARGE DIAGNOSIS:  Principal Problem:   Hypertensive urgency Active Problems:   Chest pain   Headache   Hyperlipidemia   Migraine without aura and with status migrainosus, not intractable      PAST MEDICAL HISTORY: Past Medical History  Diagnosis Date  . GERD (gastroesophageal reflux disease)   . Hiatal hernia   . Migraine   . Hypertension   . Hyperlipidemia     DISCHARGE MEDICATIONS: Current Discharge Medication List    START taking these medications   Details  irbesartan (AVAPRO) 300 MG tablet Take 1 tablet (300 mg total) by mouth daily. Qty: 30 tablet, Refills: 0    pantoprazole (PROTONIX) 40 MG tablet Take 1 tablet (40 mg total) by mouth daily. Qty: 60 tablet, Refills: 0    sucralfate (CARAFATE) 1 GM/10ML suspension Take 10 mLs (1 g total) by mouth 4 (four) times daily -  with meals and at bedtime. Qty: 420 mL, Refills: 0    triamterene-hydrochlorothiazide (MAXZIDE-25) 37.5-25 MG per tablet Take 1 tablet by mouth daily. Qty: 30 tablet, Refills: 0      CONTINUE these medications which have CHANGED   Details  amLODipine (NORVASC) 10 MG tablet Take 1 tablet (10 mg total) by mouth daily. Qty: 30 tablet, Refills: 0    metoprolol tartrate (LOPRESSOR) 25 MG tablet Take 0.5 tablets (12.5 mg total) by mouth 2 (two) times daily. Qty: 30 tablet, Refills: 0    simvastatin (ZOCOR) 20 MG tablet Take 1 tablet (20 mg total) by mouth at bedtime. Qty: 30 tablet, Refills: 0      CONTINUE these  medications which have NOT CHANGED   Details  aspirin 81 MG tablet Take 81 mg by mouth daily.    polyethylene glycol (MIRALAX / GLYCOLAX) packet Take 17 g by mouth daily as needed for mild constipation.      STOP taking these medications     ASPIRIN-CAFFEINE PO      NIFEdipine (PROCARDIA XL/ADALAT-CC) 90 MG 24 hr tablet      omeprazole (PRILOSEC) 20 MG capsule      valsartan-hydrochlorothiazide (DIOVAN-HCT) 320-25 MG per tablet         ALLERGIES:   Allergies  Allergen Reactions  . Sulfa Antibiotics     Hives     BRIEF HPI:  See H&P, Labs, Consult and Test reports for all details in brief, patient is a 74 y.o. female with history of hypertension, hyperlipidemia, esophageal strictures requiring dilation previously presented to the ER because of chest pain and headache. She was found to have uncontrolled hypertension, and admitted for further evaluation and treatment  CONSULTATIONS:   GI and neurology  PERTINENT RADIOLOGIC STUDIES: Dg Chest 2 View  05/26/2014   CLINICAL DATA:  Patient with mid chest pain since Wednesday evening.  EXAM: CHEST  2 VIEW  COMPARISON:  Chest radiograph 09/02/2013  FINDINGS: Stable cardiac and mediastinal contours. No consolidative pulmonary opacities. No pleural effusion or pneumothorax.  IMPRESSION: No acute cardiopulmonary process.   Electronically Signed   By: Lovey Newcomer M.D.   On: 05/26/2014 23:08   Ct Head Wo Contrast  05/27/2014  CLINICAL DATA:  Persistent headache and hypertension  EXAM: CT HEAD WITHOUT CONTRAST  TECHNIQUE: Contiguous axial images were obtained from the base of the skull through the vertex without intravenous contrast.  COMPARISON:  March 07, 2014  FINDINGS: The ventricles are normal in size and configuration. There is no mass, hemorrhage, extra-axial fluid collection, or midline shift. No focal gray-white compartment lesions are identified. No acute infarct evident. Calcification in the right basal ganglia region is stable.  The bony calvarium appears intact. The mastoid air cells are clear. There is probable cerumen in each external auditory canal.  IMPRESSION: No intracranial mass, hemorrhage, or focal gray - white compartment lesions/ acute appearing infarct. Calcification in the right basal ganglia region is stable and felt to have benign etiology. Apparent cerumen in each external auditory canal.   Electronically Signed   By: Lowella Grip III M.D.   On: 05/27/2014 20:41   Mr Jeri Cos TA Contrast  05/28/2014   CLINICAL DATA:  Persistent headache for 3 days. The current headache a is different than her typical migraines. Chest pain extending into the right upper extremity.  EXAM: MRI HEAD WITHOUT AND WITH CONTRAST  TECHNIQUE: Multiplanar, multiecho pulse sequences of the brain and surrounding structures were obtained without and with intravenous contrast.  CONTRAST:  55m MULTIHANCE GADOBENATE DIMEGLUMINE 529 MG/ML IV SOLN  COMPARISON:  CT head without contrast 05/27/2014.  FINDINGS: The diffusion-weighted images demonstrate no evidence for acute or subacute infarction. No acute hemorrhage or mass lesion is present. Scattered periventricular and predominantly subcortical T2 hyperintensities are evident bilaterally. 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 The ventricles are of normal size. No significant extra-axial fluid collection is present.  Flow is present in the major intracranial arteries. The globes and orbits are intact.  A polyp or mucous retention cyst is noted at the floor of the left maxillary sinus. There is mild mucosal thickening in both maxillary sinuses. Mild mucosal thickening is scattered throughout the ethmoid air cells and in the inferior left frontal sinus. There are no fluid levels. Minimal fluid is present in the mastoid air cells bilaterally. No obstructing at nasopharyngeal lesion is evident.  And 8 mm fluid collection is noted anterior to the right TMJ, likely a synovial  cyst.  Postcontrast images demonstrate no pathologic enhancement. The skullbase is unremarkable. Midline structures are within normal limits  IMPRESSION: 1. No acute or focal lesion to explain the patient's acute headache. 2. Scattered periventricular and predominantly subcortical T2 hyperintensities bilaterally likely reflect the sequela of chronic microvascular ischemia. 3. Mild sinus disease as described. 4. 8 mm cystic lesion anterior to the right TMJ likely represents a synovial cyst associated with degenerative change.   Electronically Signed   By: CSan MorelleM.D.   On: 05/28/2014 17:40     PERTINENT LAB RESULTS: CBC:  Recent Labs  05/27/14 1035  WBC 5.7  HGB 10.7*  HCT 34.5*  PLT 210   CMET CMP     Component Value Date/Time   NA 140 05/27/2014 1035   K 4.1 05/27/2014 1035   CL 109 05/27/2014 1035   CO2 26 05/27/2014 1035   GLUCOSE 104* 05/27/2014 1035   BUN 16 05/27/2014 1035   CREATININE 0.83 05/27/2014 1035   CALCIUM 8.8 05/27/2014 1035   PROT 5.8* 05/27/2014 1035   ALBUMIN 3.1* 05/27/2014 1035   AST 25 05/27/2014 1035   ALT 19 05/27/2014 1035   ALKPHOS 108 05/27/2014 1035   BILITOT 0.6 05/27/2014  [place] 05/27/2014 1035   GFRAA 79* 05/27/2014 1035    GFR Estimated Creatinine Clearance: 61.3 mL/min (by C-G formula based on Cr of 0.83). No results for input(s): LIPASE, AMYLASE in the last 72 hours.  Recent Labs  05/27/14 1035 05/27/14 1502  TROPONINI <0.03 <0.03   Invalid input(s): POCBNP No results for input(s): DDIMER in the last 72 hours. No results for input(s): HGBA1C in the last 72 hours. No results for input(s): CHOL, HDL, LDLCALC, TRIG, CHOLHDL, LDLDIRECT in the last 72 hours. No results for input(s): TSH, T4TOTAL, T3FREE, THYROIDAB in the last 72 hours.  Invalid input(s): FREET3 No results for input(s): VITAMINB12, FOLATE, FERRITIN, TIBC, IRON, RETICCTPCT in the last 72 hours. Coags: No results for input(s): INR in the  last 72 hours.  Invalid input(s): PT Microbiology: Recent Results (from the past 240 hour(s))  MRSA PCR Screening     Status: None   Collection Time: 05/27/14  3:03 AM  Result Value Ref Range Status   MRSA by PCR NEGATIVE NEGATIVE Final    Comment:        The GeneXpert MRSA Assay (FDA approved for NASAL specimens only), is one component of a comprehensive MRSA colonization surveillance program. It is not intended to diagnose MRSA infection nor to guide or monitor treatment for MRSA infections.      BRIEF HOSPITAL COURSE:   Principal Problem:   Hypertensive urgency: Likely secondary to severe headaches. Patient was admitted, changes were made to her antihypertensive medication regimen. Currently her blood pressure is moderately well controlled. Suspect we will need to gradually continue to optimize her antihypertensive regimen, that could be done in the outpatient setting. She is stable to be discharged home later today. Please see medication list for antihypertensive regimen.  Active Problems:   Headache: Suspect worsening migraine headache as the etiology. Underwent CT head that was negative, because of persistent headache underwent a MRI of the brain that was also negative. ESR only at 20, CRP was negative-headache was not consistent with temporal atelectasis well. Subsequently treated with 1 g of Depakote without any improvement. No response to Imitrex as well. Subsequently neurology was consulted, given a cocktail of NSAIDs, Compazine and Benadryl with complete resolution of headache. Given uncontrolled hypertension, would need to use NSAIDs sparingly. Have instructed patient to try Tylenol first, if no response then to try over-the-counter ibuprofen. However if she were to have persistent or worsening headaches, she would need referral to neurology, or starting prophylaxis for migraines.  Chest pain: Likely GI etiology given significant gastroesophageal reflux use. Cardiac enzymes  is negative, echocardiogram showed preserved ejection fraction without any wall motion abnormality. No further chest pain since admission. Continue PPI and Carafate on discharge. If chest pain reoccurs, can consider outpatient GI eval or cardiogenic evaluation for stress test.  Dyslipidemia: Continue statin  Gastroesophageal reflux disease: Seen by gastroenterology during this hospital stay. Started on Carafate and PPI he did significantly improved. No symptoms of reflux at the time of discharge.  TODAY-DAY OF DISCHARGE:  Subjective:   Mandy Ortiz today has no headache,no chest abdominal pain,no new weakness tingling or numbness, feels much better wants to go home today.  Objective:   Blood pressure 166/52, pulse 65, temperature 97.9 F (36.6 C), temperature source Oral, resp. rate 18, height 5' 6"  (1.676 m), weight 71.804 kg (158 lb 4.8 oz), SpO2 98 %.  Intake/Output Summary (Last 24 hours) at 05/30/14 1029 Last data filed at 05/30/14 1002  Gross per 24  hour  Intake    462 ml  Output   1650 ml  Net  -1188 ml   Filed Weights   05/26/14 2219 05/27/14 0210 05/27/14 1801  Weight: 69.854 kg (154 lb) 68.5 kg (151 lb 0.2 oz) 71.804 kg (158 lb 4.8 oz)    Exam Awake Alert, Oriented *3, No new F.N deficits, Normal affect [place].AT,PERRAL Supple Neck,No JVD, No cervical lymphadenopathy appriciated.  Symmetrical Chest wall movement, Good air movement bilaterally, CTAB RRR,No Gallops,Rubs or new Murmurs, No Parasternal Heave +ve B.Sounds, Abd Soft, Non tender, No organomegaly appriciated, No rebound -guarding or rigidity. No Cyanosis, Clubbing or edema, No new Rash or bruise  DISCHARGE CONDITION: Stable  DISPOSITION: Home  DISCHARGE INSTRUCTIONS:    Activity:  As tolerated  Diet recommendation: Heart Healthy diet  Discharge Instructions    Call MD for:  severe uncontrolled pain    Complete by:  As directed      Diet - low sodium heart healthy    Complete by:  As directed       Increase activity slowly    Complete by:  As directed            Follow-up Information    Follow up with Lujean Amel, MD On 06/09/2014.   Specialty:  Family Medicine   Why:  APPT AT 11:45 AM   Contact information:   [place] [address] [phone]      Total Time spent on discharge equals 45 minutes.  SignedOren Binet 05/30/2014 10:29 AM

## 2014-06-24 ENCOUNTER — Other Ambulatory Visit: Payer: Self-pay | Admitting: Internal Medicine

## 2014-07-30 ENCOUNTER — Other Ambulatory Visit: Payer: Self-pay | Admitting: Gastroenterology

## 2014-07-30 DIAGNOSIS — K220 Achalasia of cardia: Secondary | ICD-10-CM

## 2014-08-02 ENCOUNTER — Other Ambulatory Visit: Payer: Self-pay | Admitting: Internal Medicine

## 2014-08-04 ENCOUNTER — Ambulatory Visit
Admission: RE | Admit: 2014-08-04 | Discharge: 2014-08-04 | Disposition: A | Discharge status: Home / Self Care | Payer: Medicare Other | Source: Ambulatory Visit | Attending: Gastroenterology | Admitting: Gastroenterology

## 2014-08-04 DIAGNOSIS — K220 Achalasia of cardia: Secondary | ICD-10-CM

## 2015-10-15 ENCOUNTER — Emergency Department (HOSPITAL_BASED_OUTPATIENT_CLINIC_OR_DEPARTMENT_OTHER): Payer: Medicare Other

## 2015-10-15 ENCOUNTER — Encounter (HOSPITAL_BASED_OUTPATIENT_CLINIC_OR_DEPARTMENT_OTHER): Payer: Self-pay | Admitting: *Deleted

## 2015-10-15 ENCOUNTER — Emergency Department (HOSPITAL_BASED_OUTPATIENT_CLINIC_OR_DEPARTMENT_OTHER)
Admission: EM | Admit: 2015-10-15 | Discharge: 2015-10-15 | Disposition: A | Discharge status: Home / Self Care | Payer: Medicare Other | Attending: Emergency Medicine | Admitting: Emergency Medicine

## 2015-10-15 DIAGNOSIS — Y939 Activity, unspecified: Secondary | ICD-10-CM | POA: Diagnosis not present

## 2015-10-15 DIAGNOSIS — W1809XA Striking against other object with subsequent fall, initial encounter: Secondary | ICD-10-CM | POA: Insufficient documentation

## 2015-10-15 DIAGNOSIS — Z7982 Long term (current) use of aspirin: Secondary | ICD-10-CM | POA: Insufficient documentation

## 2015-10-15 DIAGNOSIS — I10 Essential (primary) hypertension: Secondary | ICD-10-CM | POA: Insufficient documentation

## 2015-10-15 DIAGNOSIS — S8992XA Unspecified injury of left lower leg, initial encounter: Secondary | ICD-10-CM | POA: Diagnosis present

## 2015-10-15 DIAGNOSIS — Z79899 Other long term (current) drug therapy: Secondary | ICD-10-CM | POA: Diagnosis not present

## 2015-10-15 DIAGNOSIS — Y929 Unspecified place or not applicable: Secondary | ICD-10-CM | POA: Diagnosis not present

## 2015-10-15 DIAGNOSIS — L03116 Cellulitis of left lower limb: Secondary | ICD-10-CM | POA: Diagnosis not present

## 2015-10-15 DIAGNOSIS — Y999 Unspecified external cause status: Secondary | ICD-10-CM | POA: Insufficient documentation

## 2015-10-15 MED ORDER — HYDROCODONE-ACETAMINOPHEN 5-325 MG PO TABS
1.0000 | ORAL_TABLET | Freq: Once | ORAL | Status: AC
  Administered 2015-10-15: 1 via ORAL
  Filled 2015-10-15: qty 1

## 2015-10-15 MED ORDER — HYDROCODONE-ACETAMINOPHEN 5-325 MG PO TABS: 1.0000 | ORAL_TABLET | ORAL | 0 refills | Status: DC | PRN

## 2015-10-15 MED ORDER — CEPHALEXIN 250 MG PO CAPS
500.0000 mg | ORAL_CAPSULE | Freq: Once | ORAL | Status: AC
  Administered 2015-10-15: 500 mg via ORAL
  Filled 2015-10-15: qty 2

## 2015-10-15 MED ORDER — CEPHALEXIN 500 MG PO CAPS: 500.0000 mg | ORAL_CAPSULE | Freq: Four times a day (QID) | ORAL | 0 refills | Status: DC

## 2015-10-15 NOTE — ED Triage Notes (Signed)
Injury to her left leg 2 days ago. She slipped down a ladder. Her knee hit the metal step and she has a healing abrasion to the front of her lower leg.

## 2015-10-15 NOTE — ED Provider Notes (Signed)
[facility] DEPT [facility] Provider Note   CSN: [id number] Arrival date & time: 10/15/15  1824  By signing my name below, I, Maud Deed. Royston Sinner, attest that this documentation has been prepared under the direction and in the presence of Isla Pence, MD.  Electronically Signed: Maud Deed. Royston Sinner, ED Scribe. 10/15/15. 7:15 PM.    History   Chief Complaint Chief Complaint  Patient presents with  . Leg Injury   HPI  HPI Comments: Mandy Ortiz is a 75 y.o. female with a PMHx of HTN and hyperlipemia who presents to the Emergency Department here for a L leg injury sustained 2 days ago. Pt states she slipped down a ladder striking her L knee and lower extremity against a metal step. Pt states area is painful to touch without any alleviating factors. No interventions attempted prior to arrival. Denies any recent fever or chills. Pt with known allergy to sulfa antibiotics.  PCP: Lujean Amel, MD    Past Medical History:  Diagnosis Date  . GERD (gastroesophageal reflux disease)   . Hiatal hernia   . Hyperlipidemia   . Hypertension   . Migraine     Patient Active Problem List   Diagnosis Date Noted  . Hypertensive urgency 05/27/2014  . Chest pain 05/27/2014  . Headache 05/27/2014  . Hyperlipidemia 05/27/2014  . Migraine without aura and with status migrainosus, not intractable     Past Surgical History:  Procedure Laterality Date  . ABDOMINAL HYSTERECTOMY    . ABDOMINAL SURGERY    . COLON SURGERY      OB History    No data available       Home Medications    Prior to Admission medications   Medication Sig Start Date End Date Taking? Authorizing Provider  amLODipine (NORVASC) 10 MG tablet Take 1 tablet (10 mg total) by mouth daily. 05/30/14   Shanker Kristeen Mans, MD  aspirin 81 MG tablet Take 81 mg by mouth daily.    Historical Provider, MD  irbesartan (AVAPRO) 300 MG tablet Take 1 tablet (300 mg total) by mouth daily. 05/30/14   Shanker Kristeen Mans, MD  metoprolol tartrate  (LOPRESSOR) 25 MG tablet Take 0.5 tablets (12.5 mg total) by mouth 2 (two) times daily. 05/30/14   Shanker Kristeen Mans, MD  pantoprazole (PROTONIX) 40 MG tablet Take 1 tablet (40 mg total) by mouth daily. 05/30/14   Shanker Kristeen Mans, MD  polyethylene glycol (MIRALAX / Floria Raveling) packet Take 17 g by mouth daily as needed for mild constipation.    Historical Provider, MD  simvastatin (ZOCOR) 20 MG tablet Take 1 tablet (20 mg total) by mouth at bedtime. 05/30/14   Shanker Kristeen Mans, MD  sucralfate (CARAFATE) 1 GM/10ML suspension Take 10 mLs (1 g total) by mouth 4 (four) times daily -  with meals and at bedtime. 05/30/14   Shanker Kristeen Mans, MD  triamterene-hydrochlorothiazide (MAXZIDE-25) 37.5-25 MG per tablet Take 1 tablet by mouth daily. 05/30/14   Shanker Kristeen Mans, MD    Family History Family History  Problem Relation Age of Onset  . Hypertension Mother   . Hypertension Father   . Hypertension Brother     Social History Social History  Substance Use Topics  . Smoking status: Never Smoker  . Smokeless tobacco: Never Used  . Alcohol use No     Allergies   Sulfa antibiotics   Review of Systems Review of Systems  Constitutional: Negative for chills and fever.  Skin: Positive for wound.  Surrounding swelling and redness noted to wound.  All other systems reviewed and are negative.    Physical Exam Updated Vital Signs BP 183/73   Pulse 66   Temp 98.1 F (36.7 C) (Oral)   Resp 20   Ht 5\' 6"  (1.676 m)   Wt 155 lb (70.3 kg)   SpO2 97%   BMI 25.02 kg/m   Physical Exam  Constitutional: She is oriented to person, place, and time. She appears well-developed and well-nourished. No distress.  HENT:  Head: Normocephalic and atraumatic.  Eyes: EOM are normal.  Neck: Normal range of motion.  Cardiovascular: Normal rate, regular rhythm and normal heart sounds.   Pulmonary/Chest: Effort normal and breath sounds normal.  Abdominal: Soft. She exhibits no distension. There is no  tenderness.  Musculoskeletal: Normal range of motion.  Abrasion noted to LLE with surrounding swelling and redness.  Neurological: She is alert and oriented to person, place, and time.  Skin: Skin is warm and dry.  Psychiatric: She has a normal mood and affect. Judgment normal.  Nursing note and vitals reviewed.    ED Treatments / Results   DIAGNOSTIC STUDIES: Oxygen Saturation is 97% on RA, Normal by my interpretation.    COORDINATION OF CARE: 6:50 PM- Will order imaging. Will start on antibiotic. Discussed treatment plan with pt at bedside and pt agreed to plan.     Labs (all labs ordered are listed, but only abnormal results are displayed) Labs Reviewed - No data to display  EKG  EKG Interpretation None       Radiology No results found.  Procedures Procedures (including critical care time)  Medications Ordered in ED Medications - No data to display   Initial Impression / Assessment and Plan / ED Course  I have reviewed the triage vital signs and the nursing notes.  Pertinent labs & imaging results that were available during my care of the patient were reviewed by me and considered in my medical decision making (see chart for details).  Clinical Course   Xray shows no fx.  Pt will be started on keflex for cellulitis and is given lortab for pain.  Pt knows to return if worse.  Final Clinical Impressions(s) / ED Diagnoses   Final diagnoses:  None    New Prescriptions New Prescriptions   No medications on file   I personally performed the services described in this documentation, which was scribed in my presence. The recorded information has been reviewed and is accurate.    Isla Pence, MD 10/15/15 [phone]

## 2015-10-30 ENCOUNTER — Emergency Department (HOSPITAL_COMMUNITY): Payer: Medicare Other

## 2015-10-30 ENCOUNTER — Observation Stay (HOSPITAL_COMMUNITY)
Admission: EM | Admit: 2015-10-30 | Discharge: 2015-11-03 | Disposition: A | Discharge status: Home / Self Care | Payer: Medicare Other | Attending: Internal Medicine | Admitting: Internal Medicine

## 2015-10-30 ENCOUNTER — Encounter (HOSPITAL_COMMUNITY): Payer: Self-pay | Admitting: Emergency Medicine

## 2015-10-30 DIAGNOSIS — R197 Diarrhea, unspecified: Secondary | ICD-10-CM | POA: Diagnosis not present

## 2015-10-30 DIAGNOSIS — W11XXXA Fall on and from ladder, initial encounter: Secondary | ICD-10-CM | POA: Diagnosis not present

## 2015-10-30 DIAGNOSIS — R195 Other fecal abnormalities: Secondary | ICD-10-CM | POA: Diagnosis not present

## 2015-10-30 DIAGNOSIS — I10 Essential (primary) hypertension: Secondary | ICD-10-CM | POA: Diagnosis not present

## 2015-10-30 DIAGNOSIS — L089 Local infection of the skin and subcutaneous tissue, unspecified: Secondary | ICD-10-CM | POA: Diagnosis present

## 2015-10-30 DIAGNOSIS — K869 Disease of pancreas, unspecified: Secondary | ICD-10-CM | POA: Diagnosis present

## 2015-10-30 DIAGNOSIS — Z7982 Long term (current) use of aspirin: Secondary | ICD-10-CM | POA: Insufficient documentation

## 2015-10-30 DIAGNOSIS — K862 Cyst of pancreas: Secondary | ICD-10-CM

## 2015-10-30 DIAGNOSIS — D649 Anemia, unspecified: Secondary | ICD-10-CM | POA: Diagnosis not present

## 2015-10-30 DIAGNOSIS — Z79899 Other long term (current) drug therapy: Secondary | ICD-10-CM | POA: Diagnosis not present

## 2015-10-30 DIAGNOSIS — K210 Gastro-esophageal reflux disease with esophagitis: Secondary | ICD-10-CM | POA: Insufficient documentation

## 2015-10-30 DIAGNOSIS — R1084 Generalized abdominal pain: Secondary | ICD-10-CM | POA: Diagnosis not present

## 2015-10-30 DIAGNOSIS — K769 Liver disease, unspecified: Secondary | ICD-10-CM | POA: Diagnosis present

## 2015-10-30 DIAGNOSIS — N179 Acute kidney failure, unspecified: Secondary | ICD-10-CM | POA: Diagnosis not present

## 2015-10-30 DIAGNOSIS — S8992XA Unspecified injury of left lower leg, initial encounter: Secondary | ICD-10-CM

## 2015-10-30 DIAGNOSIS — Z8669 Personal history of other diseases of the nervous system and sense organs: Secondary | ICD-10-CM

## 2015-10-30 DIAGNOSIS — R519 Headache, unspecified: Secondary | ICD-10-CM | POA: Diagnosis present

## 2015-10-30 DIAGNOSIS — L03116 Cellulitis of left lower limb: Secondary | ICD-10-CM | POA: Diagnosis not present

## 2015-10-30 DIAGNOSIS — R911 Solitary pulmonary nodule: Secondary | ICD-10-CM | POA: Diagnosis present

## 2015-10-30 DIAGNOSIS — G43009 Migraine without aura, not intractable, without status migrainosus: Secondary | ICD-10-CM | POA: Diagnosis not present

## 2015-10-30 DIAGNOSIS — R51 Headache: Secondary | ICD-10-CM

## 2015-10-30 DIAGNOSIS — E785 Hyperlipidemia, unspecified: Secondary | ICD-10-CM | POA: Diagnosis not present

## 2015-10-30 DIAGNOSIS — R109 Unspecified abdominal pain: Secondary | ICD-10-CM | POA: Diagnosis present

## 2015-10-30 DIAGNOSIS — K219 Gastro-esophageal reflux disease without esophagitis: Secondary | ICD-10-CM | POA: Diagnosis present

## 2015-10-30 DIAGNOSIS — S8012XA Contusion of left lower leg, initial encounter: Secondary | ICD-10-CM

## 2015-10-30 DIAGNOSIS — S8010XA Contusion of unspecified lower leg, initial encounter: Secondary | ICD-10-CM

## 2015-10-30 LAB — URINALYSIS, ROUTINE W REFLEX MICROSCOPIC
Bilirubin Urine: NEGATIVE
Glucose, UA: NEGATIVE mg/dL
Ketones, ur: NEGATIVE mg/dL
Nitrite: NEGATIVE
PROTEIN: NEGATIVE mg/dL
Specific Gravity, Urine: 1.018 (ref 1.005–1.030)
pH: 6.5 (ref 5.0–8.0)

## 2015-10-30 LAB — COMPREHENSIVE METABOLIC PANEL
ALBUMIN: 3.5 g/dL (ref 3.5–5.0)
ALK PHOS: 101 U/L (ref 38–126)
ALT: 17 U/L (ref 14–54)
ANION GAP: 7 (ref 5–15)
AST: 26 U/L (ref 15–41)
BUN: 16 mg/dL (ref 6–20)
CALCIUM: 9.2 mg/dL (ref 8.9–10.3)
CHLORIDE: 110 mmol/L (ref 101–111)
CO2: 24 mmol/L (ref 22–32)
Creatinine, Ser: 0.86 mg/dL (ref 0.44–1.00)
GFR calc non Af Amer: >60 mL/min (ref >60)
GLUCOSE: 88 mg/dL (ref 65–99)
POTASSIUM: 4.1 mmol/L (ref 3.5–5.1)
SODIUM: 141 mmol/L (ref 135–145)
Total Bilirubin: 0.7 mg/dL (ref 0.3–1.2)
Total Protein: 6.8 g/dL (ref 6.5–8.1)

## 2015-10-30 LAB — CBC
HCT: 34.8 % — ABNORMAL LOW (ref 36.0–46.0)
Hemoglobin: 10.6 g/dL — ABNORMAL LOW (ref 12.0–15.0)
MCH: 26.6 pg (ref 26.0–34.0)
MCHC: 30.5 g/dL (ref 30.0–36.0)
MCV: 87.4 fL (ref 78.0–100.0)
PLATELETS: 229 10*3/uL (ref 150–400)
RBC: 3.98 MIL/uL (ref 3.87–5.11)
RDW: 15.4 % (ref 11.5–15.5)
WBC: 8.9 10*3/uL (ref 4.0–10.5)

## 2015-10-30 LAB — URINE MICROSCOPIC-ADD ON

## 2015-10-30 LAB — I-STAT CG4 LACTIC ACID, ED
LACTIC ACID, VENOUS: 0.88 mmol/L (ref 0.5–1.9)
Lactic Acid, Venous: 0.77 mmol/L (ref 0.5–1.9)

## 2015-10-30 LAB — LIPASE, BLOOD: LIPASE: 28 U/L (ref 11–51)

## 2015-10-30 MED ORDER — VANCOMYCIN 50 MG/ML ORAL SOLUTION
125.0000 mg | Freq: Four times a day (QID) | ORAL | Status: DC
  Administered 2015-10-30 – 2015-10-31 (×5): 125 mg via ORAL
  Filled 2015-10-30 (×9): qty 2.5

## 2015-10-30 MED ORDER — FAMOTIDINE 20 MG PO TABS
20.0000 mg | ORAL_TABLET | Freq: Every day | ORAL | Status: DC
  Administered 2015-10-30 – 2015-11-03 (×5): 20 mg via ORAL
  Filled 2015-10-30 (×5): qty 1

## 2015-10-30 MED ORDER — ONDANSETRON HCL 4 MG/2ML IJ SOLN
4.0000 mg | Freq: Three times a day (TID) | INTRAMUSCULAR | Status: DC | PRN
  Administered 2015-10-31: 4 mg via INTRAVENOUS
  Filled 2015-10-30: qty 2

## 2015-10-30 MED ORDER — SODIUM CHLORIDE 0.9 % IV SOLN
INTRAVENOUS | Status: DC
  Administered 2015-10-30 – 2015-11-02 (×2): via INTRAVENOUS

## 2015-10-30 MED ORDER — HYDRALAZINE HCL 20 MG/ML IJ SOLN
5.0000 mg | INTRAMUSCULAR | Status: DC | PRN
  Administered 2015-10-30 – 2015-11-02 (×3): 5 mg via INTRAVENOUS
  Filled 2015-10-30 (×4): qty 1

## 2015-10-30 MED ORDER — SIMVASTATIN 20 MG PO TABS
20.0000 mg | ORAL_TABLET | Freq: Every day | ORAL | Status: DC
  Administered 2015-10-31 – 2015-11-02 (×4): 20 mg via ORAL
  Filled 2015-10-30 (×4): qty 1

## 2015-10-30 MED ORDER — SODIUM CHLORIDE 0.9 % IV SOLN
1250.0000 mg | INTRAVENOUS | Status: DC
  Administered 2015-10-31 – 2015-11-02 (×3): 1250 mg via INTRAVENOUS
  Filled 2015-10-30 (×4): qty 1250

## 2015-10-30 MED ORDER — OXYCODONE-ACETAMINOPHEN 5-325 MG PO TABS
1.0000 | ORAL_TABLET | ORAL | Status: AC | PRN
  Administered 2015-11-01 (×2): 1 via ORAL
  Filled 2015-10-30 (×3): qty 1

## 2015-10-30 MED ORDER — MORPHINE SULFATE (PF) 2 MG/ML IV SOLN
1.0000 mg | INTRAVENOUS | Status: DC | PRN
  Administered 2015-11-02 (×2): 1 mg via INTRAVENOUS
  Filled 2015-10-30 (×2): qty 1

## 2015-10-30 MED ORDER — ACETAMINOPHEN 650 MG RE SUPP: 650.0000 mg | Freq: Four times a day (QID) | RECTAL | Status: DC | PRN

## 2015-10-30 MED ORDER — VANCOMYCIN HCL IN DEXTROSE 1-5 GM/200ML-% IV SOLN
1000.0000 mg | Freq: Once | INTRAVENOUS | Status: AC
  Administered 2015-10-30: 1000 mg via INTRAVENOUS
  Filled 2015-10-30: qty 200

## 2015-10-30 MED ORDER — IRBESARTAN 300 MG PO TABS
300.0000 mg | ORAL_TABLET | Freq: Every day | ORAL | Status: DC
  Administered 2015-10-31: 300 mg via ORAL
  Filled 2015-10-30: qty 1

## 2015-10-30 MED ORDER — AMLODIPINE BESYLATE 10 MG PO TABS
10.0000 mg | ORAL_TABLET | Freq: Every day | ORAL | Status: DC
  Administered 2015-10-31 – 2015-11-03 (×4): 10 mg via ORAL
  Filled 2015-10-30 (×4): qty 1

## 2015-10-30 MED ORDER — ASPIRIN EC 81 MG PO TBEC
81.0000 mg | DELAYED_RELEASE_TABLET | Freq: Every day | ORAL | Status: DC
  Administered 2015-10-31 – 2015-11-03 (×4): 81 mg via ORAL
  Filled 2015-10-30 (×4): qty 1

## 2015-10-30 MED ORDER — SODIUM CHLORIDE 0.9 % IV BOLUS (SEPSIS)
1000.0000 mL | Freq: Once | INTRAVENOUS | Status: AC
  Administered 2015-10-30: 1000 mL via INTRAVENOUS

## 2015-10-30 MED ORDER — OXYCODONE-ACETAMINOPHEN 5-325 MG PO TABS
ORAL_TABLET | ORAL | Status: AC
  Administered 2015-10-30: 1
  Filled 2015-10-30: qty 1

## 2015-10-30 MED ORDER — IOPAMIDOL (ISOVUE-300) INJECTION 61%
INTRAVENOUS | Status: AC
  Administered 2015-10-31: 100 mL
  Filled 2015-10-30: qty 100

## 2015-10-30 MED ORDER — METRONIDAZOLE IN NACL 5-0.79 MG/ML-% IV SOLN: 500.0000 mg | Freq: Once | INTRAVENOUS | Status: DC

## 2015-10-30 MED ORDER — CLINDAMYCIN PHOSPHATE 600 MG/50ML IV SOLN: 600.0000 mg | Freq: Once | INTRAVENOUS | Status: DC

## 2015-10-30 MED ORDER — ACETAMINOPHEN 325 MG PO TABS
650.0000 mg | ORAL_TABLET | Freq: Four times a day (QID) | ORAL | Status: DC | PRN
Start: 2015-10-30 — End: 2015-11-03
  Administered 2015-10-31 – 2015-11-02 (×5): 650 mg via ORAL
  Filled 2015-10-30 (×6): qty 2

## 2015-10-30 MED ORDER — OXYCODONE-ACETAMINOPHEN 5-325 MG PO TABS: 1.0000 | ORAL_TABLET | ORAL | Status: DC | PRN

## 2015-10-30 MED ORDER — CALCIUM CARBONATE-VITAMIN D 500-200 MG-UNIT PO TABS
1.0000 | ORAL_TABLET | Freq: Every day | ORAL | Status: DC
  Administered 2015-10-31 – 2015-11-03 (×5): 1 via ORAL
  Filled 2015-10-30 (×5): qty 1

## 2015-10-30 MED ORDER — MORPHINE SULFATE (PF) 4 MG/ML IV SOLN
4.0000 mg | Freq: Once | INTRAVENOUS | Status: AC
  Administered 2015-10-30: 4 mg via INTRAVENOUS
  Filled 2015-10-30: qty 1

## 2015-10-30 NOTE — ED Notes (Signed)
Patient is stable for transport at this time.  Denies any chest pain at this time.  Patient and family aware of plan to be admitted to [facility] at this time

## 2015-10-30 NOTE — Progress Notes (Signed)
Pharmacy Antibiotic Note  Priya Borner is a 75 y.o. female admitted on 10/30/2015 with cellulitis.  Pharmacy has been consulted for vancomycin dosing.  Pt received vancomycin 1g IV once in the ED.  Plan: Vancomycin 1250mg  IV every 24 hours.  Goal trough 10-15 mcg/mL.  Monitor culture data, renal function and clinical course VT at SS prn  Weight: 148 lb (67.1 kg)  Temp (24hrs), Avg:98.1 F (36.7 C), Min:98 F (36.7 C), Max:98.2 F (36.8 C)   Recent Labs Lab 10/30/15 1314 10/30/15 1325 10/30/15 1945  WBC 8.9  --   --   CREATININE 0.86  --   --   LATICACIDVEN  --  0.88 0.77    Estimated Creatinine Clearance: 53.7 mL/min (by C-G formula based on SCr of 0.86 mg/dL).    Allergies  Allergen Reactions  . Sulfa Antibiotics Hives and Swelling  . Sulfamethoxazole Hives and Swelling    Antimicrobials this admission: Vanc 9/8 >>   Dose adjustments this admission: n/a  Microbiology results:  BCx:   UCx:    Sputum:    MRSA PCR:    Andrey Cota. Diona Foley, PharmD, [place] Clinical Pharmacist Pager [phone] 10/30/2015 9:47 PM

## 2015-10-30 NOTE — ED Notes (Signed)
Nurse drawing labs. 

## 2015-10-30 NOTE — H&P (Signed)
History and Physical    Mandy Ortiz [id number] DOB: Jan 06, 1941 DOA: 10/30/2015  Referring MD/NP/PA:   PCP: Lujean Amel, MD   Patient coming from:  The patient is coming from home.  At baseline, pt is independent for most of ADL.   Chief Complaint: Left leg pain, diarrhea, abdominal pain  HPI: Mandy Ortiz is a 75 y.o. female with medical history significant of hypertension, hyperlipidemia, GERD, migraine headache, who presents with left leg pain, diarrhea and abdominal pain.   Patient reports 3 weeks ago she fell and injured her left leg. Since then she has had persistent pain and swelling to the affected shin area in left leg. She has been seen by 3 separate providers at the ER, urgent care, and her PCP in the past 3 weeks. She was diagnosed with having an infection and has been receiving 3 separate antibiotics without significant help. She is currently on Keflex and doxycycline for the past several days. She states that her leg pain is getting worse and feels warm in that area. She does not have fever or chills. The pain is  constant, 10 out of 10 severity, nonradiating. It is not aggravated or alleviated by any known factors.  Patient states that she developed diarrhea which has going on for 3 days. She has more than 10 times of a bowel movement with loose stool. She has nausea, but no vomiting. She has diffused abdominal pain, which is constant, pain out of 10 severity, nonradiating. It is not aggravated or limited by any known factors. She denies symptoms of UTI, unilateral weakness. She also had headache earlier, which has subsided currently.  ED Course: pt was found to have WBC 8.9, lipase 28, lactate 0.88--> 0.77, temperature normal, bradycardia, electrolytes and renal function okay. X-ray of her left tibia/fibula is negative for bony fracture. Ultrasound of left lower extremity showed a hematoma and does not seem to have abscess. Pt is placed on med-surg bed for obs.  Review  of Systems:   General: no fevers, chills, no changes in body weight, has poor appetite, has fatigue HEENT: no blurry vision, hearing changes or sore throat Respiratory: no dyspnea, coughing, wheezing CV: no chest pain, no palpitations GI: has nausea, abdominal pain, diarrhea, no constipation vomiting,  GU: no dysuria, burning on urination, increased urinary frequency, hematuria  Ext: no leg edema. Has left leg pain Neuro: no unilateral weakness, numbness, or tingling, no vision change or hearing loss Skin: no rash. Has redness in left shin. MSK: No muscle spasm, no deformity, no limitation of range of movement in spin Heme: No easy bruising.  Travel history: No recent long distant travel.  Allergy:  Allergies  Allergen Reactions  . Sulfa Antibiotics Hives and Swelling  . Sulfamethoxazole Hives and Swelling    Past Medical History:  Diagnosis Date  . GERD (gastroesophageal reflux disease)   . Hiatal hernia   . Hyperlipidemia   . Hypertension   . Migraine     Past Surgical History:  Procedure Laterality Date  . ABDOMINAL HYSTERECTOMY    . ABDOMINAL SURGERY    . COLON SURGERY      Social History:  reports that she has never smoked. She has never used smokeless tobacco. She reports that she does not drink alcohol or use drugs.  Family History:  Family History  Problem Relation Age of Onset  . Hypertension Mother   . Hypertension Father   . Hypertension Brother      Prior to Admission medications   Medication  Sig Start Date End Date Taking? Authorizing Provider  amLODipine (NORVASC) 10 MG tablet Take 1 tablet (10 mg total) by mouth daily. 05/30/14  Yes Shanker Kristeen Mans, MD  aspirin 81 MG tablet Take 81 mg by mouth daily.   Yes Historical Provider, MD  CALCIUM-VITAMIN D PO Take 1 tablet by mouth every evening.   Yes Historical Provider, MD  cephALEXin (KEFLEX) 500 MG capsule Take 1 capsule (500 mg total) by mouth 4 (four) times daily. 10/15/15  Yes Isla Pence, MD    doxycycline (VIBRAMYCIN) 100 MG capsule Take 100 mg by mouth 2 (two) times daily.   Yes Historical Provider, MD  HYDROcodone-acetaminophen (NORCO/VICODIN) 5-325 MG tablet Take 1 tablet by mouth every 4 (four) hours as needed. 10/15/15  Yes Isla Pence, MD  irbesartan (AVAPRO) 300 MG tablet Take 1 tablet (300 mg total) by mouth daily. 05/30/14  Yes Shanker Kristeen Mans, MD  pantoprazole (PROTONIX) 40 MG tablet Take 1 tablet (40 mg total) by mouth daily. 05/30/14  Yes Shanker Kristeen Mans, MD  simvastatin (ZOCOR) 20 MG tablet Take 1 tablet (20 mg total) by mouth at bedtime. 05/30/14  Yes Shanker Kristeen Mans, MD  valsartan-hydrochlorothiazide (DIOVAN-HCT) 320-25 MG tablet Take 1 tablet by mouth daily.   Yes Historical Provider, MD  metoprolol tartrate (LOPRESSOR) 25 MG tablet Take 0.5 tablets (12.5 mg total) by mouth 2 (two) times daily. Patient not taking: Reported on 10/30/2015 05/30/14   Jonetta Osgood, MD  sucralfate (CARAFATE) 1 GM/10ML suspension Take 10 mLs (1 g total) by mouth 4 (four) times daily -  with meals and at bedtime. Patient not taking: Reported on 10/30/2015 05/30/14   Jonetta Osgood, MD  triamterene-hydrochlorothiazide (MAXZIDE-25) 37.5-25 MG per tablet Take 1 tablet by mouth daily. Patient not taking: Reported on 10/30/2015 05/30/14   Jonetta Osgood, MD    Physical Exam: Vitals:   10/30/15 1815 10/30/15 1910 10/30/15 1915 10/30/15 2130  BP: 131/70 162/62 159/59 150/62  Pulse: (!) 50 (!) 49 (!) 51 (!) 53  Resp:  18  18  Temp:      TempSrc:      SpO2: 98% 99% 100% 100%  Weight:       General: Not in acute distress HEENT:       Eyes: PERRL, EOMI, no scleral icterus.       ENT: No discharge from the ears and nose, no pharynx injection, no tonsillar enlargement.        Neck: No JVD, no bruit, no mass felt. Heme: No neck lymph node enlargement. Cardiac: S1/S2, RRR, No murmurs, No gallops or rubs. Respiratory:  No rales, wheezing, rhonchi or rubs. GI: mildly distended, diffusely  tenderness, no rebound pain, no organomegaly, BS present. GU: No hematuria Ext: No pitting leg edema bilaterally. 2+DP/PT pulse bilaterally. There is area with erythema, warmth and tenderness in left shin, approximately 3 x 8 cm in size. Musculoskeletal: No joint deformities, No joint redness or warmth, no limitation of ROM in spin. Skin: No rashes.  Neuro: Alert, oriented X3, cranial nerves II-XII grossly intact, moves all extremities normally.  Psych: Patient is not psychotic, no suicidal or hemocidal ideation.  Labs on Admission: I have personally reviewed following labs and imaging studies  CBC:  Recent Labs Lab 10/30/15 1314  WBC 8.9  HGB 10.6*  HCT 34.8*  MCV 87.4  PLT [id number]   Basic Metabolic Panel:  Recent Labs Lab 10/30/15 1314  NA 141  K 4.1  CL 110  CO2 24  GLUCOSE 88  BUN 16  CREATININE 0.86  CALCIUM 9.2   GFR: Estimated Creatinine Clearance: 53.7 mL/min (by C-G formula based on SCr of 0.86 mg/dL). Liver Function Tests:  Recent Labs Lab 10/30/15 1314  AST 26  ALT 17  ALKPHOS 101  BILITOT 0.7  PROT 6.8  ALBUMIN 3.5    Recent Labs Lab 10/30/15 1314  LIPASE 28   No results for input(s): AMMONIA in the last 168 hours. Coagulation Profile: No results for input(s): INR, PROTIME in the last 168 hours. Cardiac Enzymes: No results for input(s): CKTOTAL, CKMB, CKMBINDEX, TROPONINI in the last 168 hours. BNP (last 3 results) No results for input(s): PROBNP in the last 8760 hours. HbA1C: No results for input(s): HGBA1C in the last 72 hours. CBG: No results for input(s): GLUCAP in the last 168 hours. Lipid Profile: No results for input(s): CHOL, HDL, LDLCALC, TRIG, CHOLHDL, LDLDIRECT in the last 72 hours. Thyroid Function Tests: No results for input(s): TSH, T4TOTAL, FREET4, T3FREE, THYROIDAB in the last 72 hours. Anemia Panel: No results for input(s): VITAMINB12, FOLATE, FERRITIN, TIBC, IRON, RETICCTPCT in the last 72 hours. Urine analysis: No  results found for: COLORURINE, APPEARANCEUR, LABSPEC, PHURINE, GLUCOSEU, HGBUR, BILIRUBINUR, KETONESUR, PROTEINUR, UROBILINOGEN, NITRITE, LEUKOCYTESUR Sepsis Labs: @LABRCNTIP (procalcitonin:4,lacticidven:4) )No results found for this or any previous visit (from the past 240 hour(s)).   Radiological Exams on Admission: Dg Tibia/fibula Left  Result Date: 10/30/2015 CLINICAL DATA:  75 year old female with injury to left comment. Midshaft left leg wound. EXAM: LEFT TIBIA AND FIBULA - 2 VIEW COMPARISON:  Radiograph dated 10/15/2015 FINDINGS: There is no acute fracture or dislocation. The bones are mildly osteopenic. No significant arthritic changes. The soft tissues appear unremarkable. No radiopaque foreign object or soft tissue gas identified. IMPRESSION: No acute findings. Electronically Signed   By: Anner Crete M.D.   On: 10/30/2015 18:47   [place] Extrem Low Left Ltd  Result Date: 10/30/2015 CLINICAL DATA:  [place] from ladder 3 weeks ago. Assess for anterior tibia/fibula infection or hematoma. EXAM: ULTRASOUND LEFT LOWER EXTREMITY LIMITED TECHNIQUE: Ultrasound examination of the lower extremity soft tissues was performed in the area of clinical concern. COMPARISON:  LEFT tibia and fibula radiographs October 30, 2015 at 1812 hours FINDINGS: Heterogeneous, predominately hypoechoic 5.1 x 0.7 x 2.1 cm avascular fluid collection within pretibial soft tissues corresponding to area of redness and swelling. Fluid collection is approximate 4 mm deep to the skin surface. IMPRESSION: Avascular 5.1 x 0.7 x 2.1 cm pretibial fluid collection, in the setting of trauma this likely represents resolving hematoma. No definite abscess though, this could be confirmed with sampling as clinically indicated. Electronically Signed   By: Elon Alas M.D.   On: 10/30/2015 20:31     EKG: Independently reviewed. Sinus rhythm, bradycardia, QTC 437, nonspecific ST/T change.   Assessment/Plan Principal Problem:    Diarrhea Active Problems:   Headache   Traumatic hematoma of lower leg with infection   Abdominal pain   Essential hypertension   GERD (gastroesophageal reflux disease)   Diarrhea and abdominal pain: Patient has  been taking antibiotics for 3 weeks, now developed severe diarrhea and abdominal pain. Highly suspect C. difficile colitis. Patient is not septic. Hemodynamically stable. Lactate is normal.  - will place on med-surg bed - npo  - prn morphine for pain  - prn Zofran for nausea  - Will check C diff pcr, GI pathogen panel, stool culture - Blood culture x 2 - Start oral vanco empirically  -f/u c diff pcr  and Gi path panel - IVF: 1L of NS bolus in ED, followed by 100 cc/h - CT-abd/pelvis  Traumatic hematoma of lower leg with infection: her hematoma seems to be complicated by infection given erythema, warmth and tenderness. -IV vancomycin started by EDP, will continue -prn percocet for pain  Migraine headache: She had headache earlier, which subsided currently. -When necessary Tylenol  Essential hypertension: -Continue amlodipine, metoprolol, and irbesartan -Hold Maxzide -IV hydralazine when necessary -pt is also on Diovan-HCTC-->will not continue since it is duplicated and diuretics  GERD: -will switch PPI to pepcid IV until C diff pcr negative  HLD: Last LDL was not on record -Continue home medications: Zocor  DVT ppx: SCD (only to right leg) Code Status: Full code Family Communication: Yes, patient's daughter at bed side Disposition Plan:  Anticipate discharge back to previous home environment Consults called:  none Admission status: medical floor/obs  Date of Service 10/30/2015    Ivor Costa [facility] Pager [phone]  If 7PM-7AM, please contact night-coverage [email or web address] Password [facility] 10/30/2015, 10:32 PM

## 2015-10-30 NOTE — ED Notes (Signed)
Pt informed she is next in line

## 2015-10-30 NOTE — ED Provider Notes (Signed)
[facility] DEPT Provider Note   CSN: [id number] Arrival date & time: 10/30/15  1249     History   Chief Complaint Chief Complaint  Patient presents with  . Headache  . Diarrhea  . Leg Pain    HPI Avneet Barbati is a 75 y.o. female.  HPI   75 year old female with history of hypertension, hyperlipidemia, GERD, recurrent migraine headaches who presents with left leg infection. Patient reports 3 weeks ago she fell and injured her left leg. Since then she has had persistent pain and swelling to the affected leg. She has been seen by 3 separate provider at the ER, urgent care, and her primary care Dr. for this within the past 3 weeks. She was diagnosed with having an infection and has been receiving 3 separate antibiotics however her symptoms still persist. She is currently on Keflex and doxycycline for the past several days. In the past 3 days patient has had persistent diarrhea along with abdominal discomfort. She now complaining of a throbbing headache similar to her migraine which has been ongoing for the past 3 days. She denies having fever, neck stiffness, URI symptoms, chest pain, shortness of breath, productive cough, dysuria, or focal numbness. She is here at the urging of her doctor for "admission" due to the prolonged duration of her infection. No history of prior PE or DVT.  Past Medical History:  Diagnosis Date  . GERD (gastroesophageal reflux disease)   . Hiatal hernia   . Hyperlipidemia   . Hypertension   . Migraine     Patient Active Problem List   Diagnosis Date Noted  . Hypertensive urgency 05/27/2014  . Chest pain 05/27/2014  . Headache 05/27/2014  . Hyperlipidemia 05/27/2014  . Migraine without aura and with status migrainosus, not intractable     Past Surgical History:  Procedure Laterality Date  . ABDOMINAL HYSTERECTOMY    . ABDOMINAL SURGERY    . COLON SURGERY      OB History    No data available       Home Medications    Prior to  Admission medications   Medication Sig Start Date End Date Taking? Authorizing Provider  amLODipine (NORVASC) 10 MG tablet Take 1 tablet (10 mg total) by mouth daily. 05/30/14   Shanker Kristeen Mans, MD  aspirin 81 MG tablet Take 81 mg by mouth daily.    Historical Provider, MD  cephALEXin (KEFLEX) 500 MG capsule Take 1 capsule (500 mg total) by mouth 4 (four) times daily. 10/15/15   Isla Pence, MD  HYDROcodone-acetaminophen (NORCO/VICODIN) 5-325 MG tablet Take 1 tablet by mouth every 4 (four) hours as needed. 10/15/15   Isla Pence, MD  irbesartan (AVAPRO) 300 MG tablet Take 1 tablet (300 mg total) by mouth daily. 05/30/14   Shanker Kristeen Mans, MD  metoprolol tartrate (LOPRESSOR) 25 MG tablet Take 0.5 tablets (12.5 mg total) by mouth 2 (two) times daily. 05/30/14   Shanker Kristeen Mans, MD  pantoprazole (PROTONIX) 40 MG tablet Take 1 tablet (40 mg total) by mouth daily. 05/30/14   Shanker Kristeen Mans, MD  polyethylene glycol (MIRALAX / Floria Raveling) packet Take 17 g by mouth daily as needed for mild constipation.    Historical Provider, MD  simvastatin (ZOCOR) 20 MG tablet Take 1 tablet (20 mg total) by mouth at bedtime. 05/30/14   Shanker Kristeen Mans, MD  sucralfate (CARAFATE) 1 GM/10ML suspension Take 10 mLs (1 g total) by mouth 4 (four) times daily -  with meals and at bedtime. 05/30/14  Shanker Kristeen Mans, MD  triamterene-hydrochlorothiazide (MAXZIDE-25) 37.5-25 MG per tablet Take 1 tablet by mouth daily. 05/30/14   Shanker Kristeen Mans, MD    Family History Family History  Problem Relation Age of Onset  . Hypertension Mother   . Hypertension Father   . Hypertension Brother     Social History Social History  Substance Use Topics  . Smoking status: Never Smoker  . Smokeless tobacco: Never Used  . Alcohol use No     Allergies   Sulfa antibiotics   Review of Systems Review of Systems  All other systems reviewed and are negative.    Physical Exam Updated Vital Signs BP 180/71 (BP Location: Left  Arm)   Pulse (!) 57   Temp 98.2 F (36.8 C) (Oral)   Resp 18   Wt 67.1 kg   SpO2 100%   BMI 23.89 kg/m   Physical Exam  Constitutional: She appears well-developed and well-nourished. No distress.  HENT:  Head: Atraumatic.  Eyes: Conjunctivae and EOM are normal. Pupils are equal, round, and reactive to light.  Neck: Neck supple.  No nuchal rigidity  Cardiovascular: Normal rate, regular rhythm and intact distal pulses.   Pulmonary/Chest: Effort normal and breath sounds normal.  Abdominal: Soft. There is tenderness (Mild diffuse abdominal tenderness without guarding or rebound tenderness).  Musculoskeletal: She exhibits tenderness (Left lower extremity: An area of erythematous skin changes with overlying since Measuring approximately 3 x 8 cm to the anterior tib-fib, tender to palpation.).  Neurological: She is alert.  Skin: No rash noted.  Psychiatric: She has a normal mood and affect.  Nursing note and vitals reviewed.    ED Treatments / Results  Labs (all labs ordered are listed, but only abnormal results are displayed) Labs Reviewed  CBC - Abnormal; Notable for the following:       Result Value   Hemoglobin 10.6 (*)    HCT 34.8 (*)    All other components within normal limits  C DIFFICILE QUICK SCREEN W PCR REFLEX  LIPASE, BLOOD  COMPREHENSIVE METABOLIC PANEL  URINALYSIS, ROUTINE W REFLEX MICROSCOPIC (NOT AT [facility])  I-STAT CG4 LACTIC ACID, ED  I-STAT CG4 LACTIC ACID, ED    EKG  EKG Interpretation None       Radiology Dg Tibia/fibula Left  Result Date: 10/30/2015 CLINICAL DATA:  75 year old female with injury to left comment. Midshaft left leg wound. EXAM: LEFT TIBIA AND FIBULA - 2 VIEW COMPARISON:  Radiograph dated 10/15/2015 FINDINGS: There is no acute fracture or dislocation. The bones are mildly osteopenic. No significant arthritic changes. The soft tissues appear unremarkable. No radiopaque foreign object or soft tissue gas identified. IMPRESSION: No acute  findings. Electronically Signed   By: Anner Crete M.D.   On: 10/30/2015 18:47   [place] Extrem Low Left Ltd  Result Date: 10/30/2015 CLINICAL DATA:  [place] from ladder 3 weeks ago. Assess for anterior tibia/fibula infection or hematoma. EXAM: ULTRASOUND LEFT LOWER EXTREMITY LIMITED TECHNIQUE: Ultrasound examination of the lower extremity soft tissues was performed in the area of clinical concern. COMPARISON:  LEFT tibia and fibula radiographs October 30, 2015 at 1812 hours FINDINGS: Heterogeneous, predominately hypoechoic 5.1 x 0.7 x 2.1 cm avascular fluid collection within pretibial soft tissues corresponding to area of redness and swelling. Fluid collection is approximate 4 mm deep to the skin surface. IMPRESSION: Avascular 5.1 x 0.7 x 2.1 cm pretibial fluid collection, in the setting of trauma this likely represents resolving hematoma. No definite abscess though, this could be  confirmed with sampling as clinically indicated. Electronically Signed   By: Elon Alas M.D.   On: 10/30/2015 20:31    Procedures Procedures (including critical care time)  EMERGENCY DEPARTMENT US SOFT TISSUE INTERPRETATION "Study: Limited Ultrasound of the noted body part in comments below"  INDICATIONS: Soft tissue infection Multiple views of the body part are obtained with a multi-frequency linear probe  PERFORMED BY:  Myself  IMAGES ARCHIVED?: Yes  SIDE:Left  BODY PART:Lower extremity  FINDINGS: Abcess present and Cellulitis present  LIMITATIONS:  Emergent Procedure  INTERPRETATION:  Abcess present and Cellulitis present  COMMENT:  L anterior tib/fib with evidence of traumatic hematoma with overlying skin infection.      Medications Ordered in ED Medications  oxyCODONE-acetaminophen (PERCOCET/ROXICET) 5-325 MG per tablet 1 tablet (not administered)  oxyCODONE-acetaminophen (PERCOCET/ROXICET) 5-325 MG per tablet (1 tablet  Given 10/30/15 1500)  sodium chloride 0.9 % bolus 1,000 mL (1,000 mLs  Intravenous New Bag/Given 10/30/15 1928)  morphine 4 MG/ML injection 4 mg (4 mg Intravenous Given 10/30/15 1928)  vancomycin (VANCOCIN) IVPB 1000 mg/200 mL premix (1,000 mg Intravenous New Bag/Given 10/30/15 1935)     Initial Impression / Assessment and Plan / ED Course  I have reviewed the triage vital signs and the nursing notes.  Pertinent labs & imaging results that were available during my care of the patient were reviewed by me and considered in my medical decision making (see chart for details).  Clinical Course    BP 159/59   Pulse (!) 51   Temp 98.2 F (36.8 C) (Oral)   Resp 18   Wt 67.1 kg   SpO2 100%   BMI 23.89 kg/m    Final Clinical Impressions(s) / ED Diagnoses   Final diagnoses:  Leg injury, left, initial encounter  Traumatic hematoma of lower leg with infection, left, initial encounter  Diarrhea of presumed infectious origin    New Prescriptions New Prescriptions   No medications on file   5:56 PM Patient here due to persistent pain and swelling to her left low should be after a fall 3 weeks ago. It appears that she may have a traumatic hematoma with potential overlying skin infection.  Will perform bedside US to assess the wound.  Xray of L tib/fib to r/o occult fx.    6:14 PM Pt also report having persistent diarrhea and abdominal pain x 3 days.  Concern for potential C.diff as pt is currently on abx.  Will check C.Diff CRP.  Will also give IV vancomycin for cellulitis. Her headache is chronic in nature, no red flags.  Pain medication given. Care discussed with Dr. Thomasene Lot.     6:37 PM Will obtain formal US of L tibfib to assess for potential hematoma vs abscess.    9:15 PM [place] of LLE demonstrates hypoechoic fluid collection likely resolving hematoma.  Appreciate consultation from [place], Dr. Blaine Hamper who agrees to see pt in the ER and will admit to obs, medical surgical floor for further care.    Domenic Moras, PA-C 10/30/15 2117    [place], MD 10/30/15 2222

## 2015-10-30 NOTE — ED Notes (Signed)
Phlebotomy in room.  EMT will transport patient to 6N bed 1 after

## 2015-10-30 NOTE — ED Triage Notes (Signed)
Pt has multiple complaints. Migraine since Wednesday, diarrhea since Monday, and states her infection in her leg is not getting better. Pt states shes been to the doctor three times, and all three times they've given her antibiotics and was told if the infection got worse she needs to check in. Pt L leg has swelling and redness from a fall three weeks ago.

## 2015-10-31 ENCOUNTER — Observation Stay (HOSPITAL_COMMUNITY): Payer: Medicare Other

## 2015-10-31 DIAGNOSIS — S8012XD Contusion of left lower leg, subsequent encounter: Secondary | ICD-10-CM

## 2015-10-31 DIAGNOSIS — I10 Essential (primary) hypertension: Secondary | ICD-10-CM

## 2015-10-31 DIAGNOSIS — K219 Gastro-esophageal reflux disease without esophagitis: Secondary | ICD-10-CM

## 2015-10-31 DIAGNOSIS — K7689 Other specified diseases of liver: Secondary | ICD-10-CM

## 2015-10-31 DIAGNOSIS — R911 Solitary pulmonary nodule: Secondary | ICD-10-CM

## 2015-10-31 DIAGNOSIS — R197 Diarrhea, unspecified: Secondary | ICD-10-CM | POA: Diagnosis not present

## 2015-10-31 DIAGNOSIS — L089 Local infection of the skin and subcutaneous tissue, unspecified: Secondary | ICD-10-CM

## 2015-10-31 DIAGNOSIS — G4452 New daily persistent headache (NDPH): Secondary | ICD-10-CM

## 2015-10-31 DIAGNOSIS — R1084 Generalized abdominal pain: Secondary | ICD-10-CM

## 2015-10-31 DIAGNOSIS — N179 Acute kidney failure, unspecified: Secondary | ICD-10-CM | POA: Diagnosis not present

## 2015-10-31 DIAGNOSIS — K869 Disease of pancreas, unspecified: Secondary | ICD-10-CM | POA: Diagnosis present

## 2015-10-31 DIAGNOSIS — Z8669 Personal history of other diseases of the nervous system and sense organs: Secondary | ICD-10-CM

## 2015-10-31 DIAGNOSIS — K769 Liver disease, unspecified: Secondary | ICD-10-CM | POA: Diagnosis present

## 2015-10-31 LAB — BASIC METABOLIC PANEL
Anion gap: 9 (ref 5–15)
BUN: 11 mg/dL (ref 6–20)
CHLORIDE: 108 mmol/L (ref 101–111)
CO2: 24 mmol/L (ref 22–32)
CREATININE: 0.78 mg/dL (ref 0.44–1.00)
Calcium: 9.2 mg/dL (ref 8.9–10.3)
GFR calc Af Amer: >60 mL/min (ref >60)
GFR calc non Af Amer: >60 mL/min (ref >60)
GLUCOSE: 91 mg/dL (ref 65–99)
POTASSIUM: 4.2 mmol/L (ref 3.5–5.1)
Sodium: 141 mmol/L (ref 135–145)

## 2015-10-31 LAB — CBC
HEMATOCRIT: 32.9 % — AB (ref 36.0–46.0)
Hemoglobin: 10.2 g/dL — ABNORMAL LOW (ref 12.0–15.0)
MCH: 26.7 pg (ref 26.0–34.0)
MCHC: 31 g/dL (ref 30.0–36.0)
MCV: 86.1 fL (ref 78.0–100.0)
Platelets: 200 10*3/uL (ref 150–400)
RBC: 3.82 MIL/uL — ABNORMAL LOW (ref 3.87–5.11)
RDW: 15.7 % — AB (ref 11.5–15.5)
WBC: 8.7 10*3/uL (ref 4.0–10.5)

## 2015-10-31 LAB — FERRITIN: FERRITIN: 55 ng/mL (ref 11–307)

## 2015-10-31 LAB — GLUCOSE, CAPILLARY
Glucose-Capillary: 97 mg/dL (ref 65–99)
Glucose-Capillary: 96 mg/dL (ref 65–99)

## 2015-10-31 LAB — IRON AND TIBC
Iron: 67 ug/dL (ref 28–170)
SATURATION RATIOS: 21 % (ref 10.4–31.8)
TIBC: 316 ug/dL (ref 250–450)
UIBC: 249 ug/dL

## 2015-10-31 LAB — RETICULOCYTES
RBC.: 3.86 MIL/uL — AB (ref 3.87–5.11)
RETIC CT PCT: 1.8 % (ref 0.4–3.1)
Retic Count, Absolute: 69.5 10*3/uL (ref 19.0–186.0)

## 2015-10-31 LAB — FOLATE: FOLATE: 37.1 ng/mL (ref >5.9)

## 2015-10-31 LAB — VITAMIN B12: Vitamin B-12: 4656 pg/mL — ABNORMAL HIGH (ref 180–914)

## 2015-10-31 MED ORDER — GADOBENATE DIMEGLUMINE 529 MG/ML IV SOLN
14.0000 mL | Freq: Once | INTRAVENOUS | Status: AC | PRN
  Administered 2015-11-02: 14 mL via INTRAVENOUS

## 2015-10-31 MED ORDER — IOPAMIDOL (ISOVUE-300) INJECTION 61%
INTRAVENOUS | Status: AC
  Filled 2015-10-31: qty 100

## 2015-10-31 NOTE — Progress Notes (Addendum)
Progress Note    Mandy Ortiz  [id number] DOB: [birth year]  DOA: 10/30/2015 PCP: Lujean Amel, MD    Brief Narrative:   Chief complaint: Follow-up diarrhea and left leg pain/cellulitis  Mandy Ortiz is an 75 y.o. female the [place] of stable hypertension, hyperlipidemia and recent injury to her left leg 3 weeks ago complicated by cellulitis, managed with Keflex and doxycycline as an outpatient, who was admitted 10/30/15 with ongoing severe left leg pain and a 3 day history of profuse diarrhea associated with diffuse abdominal pain suspicious for C. difficile.  Assessment/Plan:   Principal Problems:   Diarrhea and abdominal pain patient with history of antibiotic exposure Patient was put on oral vancomycin empirically. No abnormalities in the colon found on CT. Follow-up C. difficile PCR, GI pathogen panel, stool cultures, blood cultures. Continue to hydrate.    Traumatic hematoma of lower leg with infection Currently being managed with IV vancomycin. Monitor for renal toxicity.    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 LFTs/lipase not elevated. MRCP subsequently ordered. Images personally reviewed. Findings showed multiple hypervascular lesions in the liver, likely benign, but outpatient hepatic MRI recommended after resolution of acute symptoms. 12 mm cystic lesion in the head of the pancreas appears to be benign. Repeat MRI without and with contrast in 12 months recommended. No choledoholelithiasis or cholelithiasis.  Active Problems:   Left lung nodule Incidentally discovered on MRI. Noncontrast CT of the chest at 6-12 months recommended.    H/O Bells Palsy    Headache Tylenol ordered as needed.    Essential hypertension Currently being managed with amlodipine  and irbesartan. Maxide currently on hold. Blood pressure suboptimally controlled. Continue hydralazine as needed. Resume Maxzide when diarrhea improved.    GERD (gastroesophageal reflux disease) with esophagitis CT showed esophagitis. PPI on hold with concerns for C. difficile. Continue Pepcid and Carafate.    Normocytic anemia Check anemia panel.   Family Communication/Anticipated D/C date and plan/Code Status   DVT prophylaxis: SCDs ordered. Code Status: Full Code.  Family Communication: No family at the bedside. Disposition Plan: Lives alone, daughter stays with her. Home in 24 hours if no further diarrhea.   Medical Consultants:    None.   Procedures:    None  Anti-Infectives:    Vancomycin 10/30/15 (oral and IV) --->  Subjective:   The patient reports that she has not moved her bowels since admission. She has left leg pain, rated 7/10, sharp in quality. Review of symptoms is positive for nausea last evening and negative for abdominal pain, fever, chills, ongoing headache, reflux symptoms.  Objective:    Vitals:   10/30/15 2130 10/30/15 2300 10/31/15 0138 10/31/15 0341  BP: 150/62 (!) 186/71 (!) 187/70 (!) 169/76  Pulse: (!) 53 64    Resp: 18 18    Temp:  98.1 F (36.7 C)  97.9 F (36.6 C)  TempSrc:  Oral  Oral  SpO2: 100% 99%    Weight:        Intake/Output Summary (Last 24 hours) at 10/31/15 1319 Last data filed at 10/31/15 0616  Gross per 24 hour  Intake              850 ml  Output                0 ml  Net              850 ml   Filed Weights   10/30/15 1257  Weight: 67.1 kg (148 lb)    Exam: General exam: Appears calm and comfortable.  Respiratory system: Clear to auscultation. Respiratory effort normal. Cardiovascular system: S1 & S2 heard, RRR. No JVD,  rubs, gallops or clicks. No murmurs. Gastrointestinal system: Abdomen is nondistended, soft and nontender. No organomegaly or masses felt. Normal bowel sounds heard. Central nervous system:  Alert and oriented. Right facial weakness (h/o Bell's palsy) with a facial tic noted. Extremities: No clubbing,  or cyanosis. No edema. Skin: No rashes.  Left anterior shin with 8 cm area of induration/increased warmth. Psychiatry: Judgement and insight appear normal. Mood & affect appropriate.   Data Reviewed:   I have personally reviewed following labs and imaging studies:  Labs: Basic Metabolic Panel:  Recent Labs Lab 10/30/15 1314 10/31/15 0743  NA 141 141  K 4.1 4.2  CL 110 108  CO2 24 24  GLUCOSE 88 91  BUN 16 11  CREATININE 0.86 0.78  CALCIUM 9.2 9.2   GFR Estimated Creatinine Clearance: 57.8 mL/min (by C-G formula based on SCr of 0.8 mg/dL). Liver Function Tests:  Recent Labs Lab 10/30/15 1314  AST 26  ALT 17  ALKPHOS 101  BILITOT 0.7  PROT 6.8  ALBUMIN 3.5    Recent Labs Lab 10/30/15 1314  LIPASE 28   CBC:  Recent Labs Lab 10/30/15 1314 10/31/15 0743  WBC 8.9 8.7  HGB 10.6* 10.2*  HCT 34.8* 32.9*  MCV 87.4 86.1  PLT 229 200   CBG:  Recent Labs Lab 10/31/15 0648 10/31/15 0752  GLUCAP 97 96   Sepsis Labs:  Recent Labs Lab 10/30/15 1314 10/30/15 1325 10/30/15 1945 10/31/15 0743  WBC 8.9  --   --  8.7  LATICACIDVEN  --  0.88 0.77  --     Microbiology Recent Results (from the past 240 hour(s))  Culture, blood (Routine X 2) w Reflex to ID Panel     Status: None (Preliminary result)   Collection Time: 10/30/15 10:04 PM  Result Value Ref Range Status   Specimen Description BLOOD RIGHT ARM  Final   Special Requests IN PEDIATRIC BOTTLE 3ML  Final   Culture NO GROWTH < 12 HOURS  Final   Report Status PENDING  Incomplete  Culture, blood (Routine X 2) w Reflex to ID Panel     Status: None (Preliminary result)   Collection Time: 10/30/15 10:13 PM  Result Value Ref Range Status   Specimen Description BLOOD LEFT HAND  Final   Special Requests IN PEDIATRIC BOTTLE 1.5ML  Final   Culture NO GROWTH < 12 HOURS  Final   Report Status  PENDING  Incomplete    Radiology: Dg Tibia/fibula Left  Result Date: 10/30/2015 CLINICAL DATA:  75 year old female with injury to left comment. Midshaft left leg wound. EXAM: LEFT TIBIA AND FIBULA - 2 VIEW COMPARISON:  Radiograph dated 10/15/2015 FINDINGS: There is no acute fracture or dislocation. The bones are mildly osteopenic. No significant arthritic changes. The soft tissues appear unremarkable. No radiopaque foreign object or soft tissue gas identified. IMPRESSION: No acute findings. Electronically Signed   By: Anner Crete M.D.   On: 10/30/2015 18:47   Ct Abdomen Pelvis W Contrast  Result Date: 10/31/2015 CLINICAL DATA:  Subacute onset of generalized abdominal pain. Initial encounter. EXAM: CT ABDOMEN AND PELVIS WITH CONTRAST TECHNIQUE: Multidetector CT imaging of the abdomen  and pelvis was performed using the standard protocol following bolus administration of intravenous contrast. CONTRAST:  164mL ISOVUE-300 IOPAMIDOL (ISOVUE-300) INJECTION 61% COMPARISON:  None. FINDINGS: Lower chest: Minimal bibasilar atelectasis is noted. The visualized portions of the mediastinum are unremarkable. Wall thickening is noted along the distal esophagus, concerning for esophagitis. Hepatobiliary: There is mild intrahepatic biliary ductal dilatation, with dilatation of the common bile duct to 1.6 cm. The gallbladder is unremarkable in appearance. Pancreas: A 1.2 cm cystic structure is noted at the proximal body of the pancreas. The pancreas is otherwise grossly unremarkable. Spleen: The spleen is unremarkable in appearance. Adrenals/Urinary Tract: The adrenal glands are unremarkable in appearance. Small bilateral renal cysts are seen. The kidneys are otherwise unremarkable. There is no evidence of hydronephrosis. No renal or ureteral stones are identified. No perinephric stranding is seen. Stomach/Bowel: The stomach is unremarkable in appearance. The small bowel is grossly unremarkable, with postoperative change  noted at the mid to distal ileum. The appendix is not visualized; there is no evidence for appendicitis. Minimal diverticulosis is noted along the sigmoid colon, without evidence of diverticulitis. Vascular/Lymphatic: Scattered calcification is seen along the abdominal aorta and its branches. The abdominal aorta is otherwise grossly unremarkable. The inferior vena cava is grossly unremarkable. No retroperitoneal lymphadenopathy is seen. No pelvic sidewall lymphadenopathy is identified. Reproductive: The bladder is moderately distended and grossly unremarkable. The patient is status post hysterectomy. No suspicious adnexal masses are seen. Other: No additional soft tissue abnormalities are characterized. Musculoskeletal: No acute osseous abnormalities are identified. Vacuum phenomenon is noted at L5-S1. The visualized musculature is grossly unremarkable. IMPRESSION: 1. Wall thickening along the distal esophagus raises concern for esophagitis. 2. Mild intrahepatic biliary ductal dilatation, with dilatation of the common bile duct to 1.6 cm, concerning for distal obstruction. MRCP is recommended for further evaluation. 3. **An incidental finding of potential clinical significance has been found. 1.2 cm cystic structure at the proximal body of the pancreas. Would correlate with pancreatic lab values, and evaluate further on MRCP.** 4. Small bilateral renal cysts seen. 5. Minimal diverticulosis along the sigmoid colon, without evidence of diverticulitis. Electronically Signed   By: Garald Balding M.D.   On: 10/31/2015 03:32   Mr 3d Recon At Scanner  Result Date: 10/31/2015 CLINICAL DATA:  Intra and extrahepatic biliary duct dilatation on CT scan earlier today. 12 mm pancreatic cyst. EXAM: MRI ABDOMEN WITHOUT CONTRAST  (INCLUDING MRCP) TECHNIQUE: Multiplanar multisequence MR imaging of the abdomen was performed. Heavily T2-weighted images of the biliary and pancreatic ducts were obtained, and three-dimensional MRCP  images were rendered by post processing. COMPARISON:  CT scan earlier today. Virtual colonoscopy study from 11/05/2012. FINDINGS: Lower chest: 8 mm posterior left lung nodule noted (see image 11 series 4). Hepatobiliary: Evaluation of the liver is degraded by patient breathing motion. Multiple hypervascular lesions are identified in the liver, scattered through both hepatic lobes and ranging in size from 6 mm up to a dominant lesion in the dome of the liver measuring 3 cm. This dominant lesion in the hepatic dome was very subtle, but perceptible on the study from 11/05/2012 when it measured 2.3 cm. Other subtle hypo attenuating lesions scattered through the liver parenchyma were seen on the 2014 exam as well. Gallbladder is unremarkable. Extrahepatic common duct measures 9 mm in diameter today. Common bile duct in the head of the pancreas measures 7 mm which is upper normal for patient age. Pancreas: 12 mm cystic lesion in the head of the pancreas has homogeneous high  signal intensity on T2 weighted imaging and and shows no evidence for enhancement after IV contrast administration. Relationship of this lesion to the main pancreatic duct cannot be evaluated secondary to motion artifact. There is no evidence for an enhancing mass in the pancreatic parenchyma. No dilatation of the main pancreatic duct. Spleen: No splenomegaly. No focal mass lesion. Adrenals/Urinary Tract: No adrenal nodule or mass. Multiple simple cysts are noted in the kidneys bilaterally. There is no hydronephrosis. Stomach/Bowel: Stomach is unremarkable. Duodenum is normally positioned as is the ligament of Treitz. Visualize small bowel and colon of the abdomen is nondilated. Vascular/Lymphatic: No abdominal aortic aneurysm. There is no gastrohepatic or hepatoduodenal ligament lymphadenopathy. No intraperitoneal or retroperitoneal lymphadenopathy. Other: No intraperitoneal free fluid. Musculoskeletal: No abnormal marrow enhancement within the  visualized bony anatomy. IMPRESSION: 1. Multiple hypervascular lesions are identified throughout the liver. The largest lesions today were barely perceptible on the noncontrast CT scan from 11/05/2012 and have progressed only mildly in the interval. As such, these likely represent benign disease but cannot be definitively characterized on today's study. Dedicated hepatic MRI with hepatocyte specific contrast agent (Eovist) may prove helpful to further evaluate. This exam should be performed after resolution of acute symptoms and could be performed on an outpatient basis when the patient would be best able to cooperate with positioning and reproducible breath holding as motion artifact substantially degrades image quality on today's study. 2. 12 mm cystic lesion in the head of the pancreas has simple features and is likely benign. Repeat MRI without and with contrast in 12 months is recommended. This recommendation follows ACR consensus guidelines: Management of Incidental Pancreatic Cysts: A White Paper of the ACR Incidental Findings Committee. J Am Coll Radiol [id number]. 3. Mild prominence of the extrahepatic common duct with upper normal diameter of the common bile duct. No obstructing pancreatic head mass or evidence of choledocholithiasis. No cholelithiasis. 4. **An incidental finding of potential clinical significance has been found. 8 mm left lung nodule. Non-contrast chest CT at 6-12 months is recommended. If the nodule is stable at time of repeat CT, then future CT at 18-24 months (from today's scan) is considered optional for low-risk patients, but is recommended for high-risk patients. This recommendation follows the consensus statement: Guidelines for Management of Incidental Pulmonary Nodules Detected on CT Images:From the Fleischner Society 2017; published online before print (10.1148/radiol.SG:5268862).** Electronically Signed   By: Misty Stanley M.D.   On: 10/31/2015 11:27   [place] Extrem Low Left  Ltd  Result Date: 10/30/2015 CLINICAL DATA:  [place] from ladder 3 weeks ago. Assess for anterior tibia/fibula infection or hematoma. EXAM: ULTRASOUND LEFT LOWER EXTREMITY LIMITED TECHNIQUE: Ultrasound examination of the lower extremity soft tissues was performed in the area of clinical concern. COMPARISON:  LEFT tibia and fibula radiographs October 30, 2015 at 1812 hours FINDINGS: Heterogeneous, predominately hypoechoic 5.1 x 0.7 x 2.1 cm avascular fluid collection within pretibial soft tissues corresponding to area of redness and swelling. Fluid collection is approximate 4 mm deep to the skin surface. IMPRESSION: Avascular 5.1 x 0.7 x 2.1 cm pretibial fluid collection, in the setting of trauma this likely represents resolving hematoma. No definite abscess though, this could be confirmed with sampling as clinically indicated. Electronically Signed   By: Elon Alas M.D.   On: 10/30/2015 20:31   Mr Lambert Mody Cm/mrcp  Result Date: 10/31/2015 CLINICAL DATA:  Intra and extrahepatic biliary duct dilatation on CT scan earlier today. 12 mm pancreatic cyst. EXAM: MRI ABDOMEN WITHOUT CONTRAST  (  INCLUDING MRCP) TECHNIQUE: Multiplanar multisequence MR imaging of the abdomen was performed. Heavily T2-weighted images of the biliary and pancreatic ducts were obtained, and three-dimensional MRCP images were rendered by post processing. COMPARISON:  CT scan earlier today. Virtual colonoscopy study from 11/05/2012. FINDINGS: Lower chest: 8 mm posterior left lung nodule noted (see image 11 series 4). Hepatobiliary: Evaluation of the liver is degraded by patient breathing motion. Multiple hypervascular lesions are identified in the liver, scattered through both hepatic lobes and ranging in size from 6 mm up to a dominant lesion in the dome of the liver measuring 3 cm. This dominant lesion in the hepatic dome was very subtle, but perceptible on the study from 11/05/2012 when it measured 2.3 cm. Other subtle hypo attenuating  lesions scattered through the liver parenchyma were seen on the 2014 exam as well. Gallbladder is unremarkable. Extrahepatic common duct measures 9 mm in diameter today. Common bile duct in the head of the pancreas measures 7 mm which is upper normal for patient age. Pancreas: 12 mm cystic lesion in the head of the pancreas has homogeneous high signal intensity on T2 weighted imaging and and shows no evidence for enhancement after IV contrast administration. Relationship of this lesion to the main pancreatic duct cannot be evaluated secondary to motion artifact. There is no evidence for an enhancing mass in the pancreatic parenchyma. No dilatation of the main pancreatic duct. Spleen: No splenomegaly. No focal mass lesion. Adrenals/Urinary Tract: No adrenal nodule or mass. Multiple simple cysts are noted in the kidneys bilaterally. There is no hydronephrosis. Stomach/Bowel: Stomach is unremarkable. Duodenum is normally positioned as is the ligament of Treitz. Visualize small bowel and colon of the abdomen is nondilated. Vascular/Lymphatic: No abdominal aortic aneurysm. There is no gastrohepatic or hepatoduodenal ligament lymphadenopathy. No intraperitoneal or retroperitoneal lymphadenopathy. Other: No intraperitoneal free fluid. Musculoskeletal: No abnormal marrow enhancement within the visualized bony anatomy. IMPRESSION: 1. Multiple hypervascular lesions are identified throughout the liver. The largest lesions today were barely perceptible on the noncontrast CT scan from 11/05/2012 and have progressed only mildly in the interval. As such, these likely represent benign disease but cannot be definitively characterized on today's study. Dedicated hepatic MRI with hepatocyte specific contrast agent (Eovist) may prove helpful to further evaluate. This exam should be performed after resolution of acute symptoms and could be performed on an outpatient basis when the patient would be best able to cooperate with positioning  and reproducible breath holding as motion artifact substantially degrades image quality on today's study. 2. 12 mm cystic lesion in the head of the pancreas has simple features and is likely benign. Repeat MRI without and with contrast in 12 months is recommended. This recommendation follows ACR consensus guidelines: Management of Incidental Pancreatic Cysts: A White Paper of the ACR Incidental Findings Committee. J Am Coll Radiol [id number]. 3. Mild prominence of the extrahepatic common duct with upper normal diameter of the common bile duct. No obstructing pancreatic head mass or evidence of choledocholithiasis. No cholelithiasis. 4. **An incidental finding of potential clinical significance has been found. 8 mm left lung nodule. Non-contrast chest CT at 6-12 months is recommended. If the nodule is stable at time of repeat CT, then future CT at 18-24 months (from today's scan) is considered optional for low-risk patients, but is recommended for high-risk patients. This recommendation follows the consensus statement: Guidelines for Management of Incidental Pulmonary Nodules Detected on CT Images:From the Fleischner Society 2017; published online before print (10.1148/radiol.SG:5268862).** Electronically Signed   By: Randall Hiss  Tery Sanfilippo M.D.   On: 10/31/2015 11:27    Medications:   . amLODipine  10 mg Oral Daily  . aspirin EC  81 mg Oral Daily  . calcium-vitamin D  1 tablet Oral Daily  . famotidine  20 mg Oral Daily  . iopamidol      . irbesartan  300 mg Oral Daily  . simvastatin  20 mg Oral QHS  . vancomycin  1,250 mg Intravenous Q24H  . vancomycin  125 mg Oral QID   Continuous Infusions: . sodium chloride 100 mL/hr at 10/30/15 2318    Medical decision making is of high complexity and this patient is at high risk of deterioration, therefore this is a level 3 visit.     LOS: 0 days   Jerrel Tiberio  [facility] Pager [phone]. If unable to reach me by pager, please call my cell phone  at [phone].  *Please refer to [email or web address], password [email or web address] to get updated schedule on who will round on this patient, as hospitalists switch teams weekly. If 7PM-7AM, please contact night-coverage at [email or web address], password [email or web address] for any overnight needs.  10/31/2015, 1:19 PM

## 2015-10-31 NOTE — Progress Notes (Signed)
Pt did bowel movement but contaminated specimen with urine.

## 2015-11-01 DIAGNOSIS — R195 Other fecal abnormalities: Secondary | ICD-10-CM | POA: Diagnosis present

## 2015-11-01 DIAGNOSIS — N179 Acute kidney failure, unspecified: Secondary | ICD-10-CM | POA: Diagnosis not present

## 2015-11-01 DIAGNOSIS — I10 Essential (primary) hypertension: Secondary | ICD-10-CM | POA: Diagnosis not present

## 2015-11-01 DIAGNOSIS — R1084 Generalized abdominal pain: Secondary | ICD-10-CM | POA: Diagnosis not present

## 2015-11-01 DIAGNOSIS — R197 Diarrhea, unspecified: Secondary | ICD-10-CM | POA: Diagnosis not present

## 2015-11-01 LAB — OCCULT BLOOD X 1 CARD TO LAB, STOOL: FECAL OCCULT BLD: POSITIVE — AB

## 2015-11-01 LAB — BASIC METABOLIC PANEL
Anion gap: 6 (ref 5–15)
BUN: 19 mg/dL (ref 6–20)
CALCIUM: 9.1 mg/dL (ref 8.9–10.3)
CHLORIDE: 108 mmol/L (ref 101–111)
CO2: 25 mmol/L (ref 22–32)
CREATININE: 1.1 mg/dL — AB (ref 0.44–1.00)
GFR calc non Af Amer: 48 mL/min — ABNORMAL LOW (ref >60)
GFR, EST AFRICAN AMERICAN: 56 mL/min — AB (ref >60)
GLUCOSE: 103 mg/dL — AB (ref 65–99)
Potassium: 3.7 mmol/L (ref 3.5–5.1)
Sodium: 139 mmol/L (ref 135–145)

## 2015-11-01 LAB — CBC
HEMATOCRIT: 33.8 % — AB (ref 36.0–46.0)
Hemoglobin: 10.3 g/dL — ABNORMAL LOW (ref 12.0–15.0)
MCH: 26.7 pg (ref 26.0–34.0)
MCHC: 30.5 g/dL (ref 30.0–36.0)
MCV: 87.6 fL (ref 78.0–100.0)
Platelets: 210 10*3/uL (ref 150–400)
RBC: 3.86 MIL/uL — ABNORMAL LOW (ref 3.87–5.11)
RDW: 15.5 % (ref 11.5–15.5)
WBC: 7.4 10*3/uL (ref 4.0–10.5)

## 2015-11-01 LAB — GLUCOSE, CAPILLARY: Glucose-Capillary: 104 mg/dL — ABNORMAL HIGH (ref 65–99)

## 2015-11-01 NOTE — Progress Notes (Signed)
Physical Therapy Evaluation/Discharge Patient Details Name: Mandy Ortiz MRN: [id number] DOB: 1940/07/30 Today's Date: 11/01/2015   History of Present Illness  75 y.o. female admitted to [facility] on 10/30/15 for diarrhea/abdominal pain.  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  Pt with other significant PMhx of HTN.   Clinical Impression  Pt is independent with all mobility, solid on her feet with no signs of balance deficits.  I cleared her to walk the hallways independently.  RN made aware.  She has no current acute or f/u PT needed at this time.  PT to sign off.     Follow Up Recommendations No PT follow up    Equipment Recommendations  None recommended by PT    Recommendations for Other Services   NA    Precautions / Restrictions Precautions Precautions: None      Mobility  Bed Mobility Overal bed mobility: Independent                Transfers Overall transfer level: Independent                  Ambulation/Gait Ambulation/Gait assistance: Independent Ambulation Distance (Feet): 515 Feet Assistive device: None Gait Pattern/deviations: WFL(Within Functional Limits)   Gait velocity interpretation: at or above normal speed for age/gender    Stairs Stairs: Yes Stairs assistance: Independent Stair Management: No rails;Alternating pattern;Forwards Number of Stairs: 5 General stair comments: independent         Balance Overall balance assessment: Independent;No apparent balance deficits (not formally assessed)                                           Pertinent Vitals/Pain Pain Assessment: 0-10 Pain Score: 4  Pain Location: abdomen Pain Descriptors / Indicators: Grimacing;Guarding Pain Intervention(s): Limited activity within patient's tolerance;Monitored  during session;Repositioned    Home Living Family/patient expects to be discharged to:: Private residence Living Arrangements: Children (daughter) Available Help at Discharge: Family;Available PRN/intermittently (daughter really will not help her) Type of Home: House Home Access: Stairs to enter Entrance Stairs-Rails: None Entrance Stairs-Number of Steps: 3 Home Layout: One level        Prior Function Level of Independence: Independent                  Extremity/Trunk Assessment   Upper Extremity Assessment: Overall WFL for tasks assessed           Lower Extremity Assessment: Overall WFL for tasks assessed      Cervical / Trunk Assessment: Normal  Communication   Communication: No difficulties  Cognition Arousal/Alertness: Awake/alert Behavior During Therapy: WFL for tasks assessed/performed Overall Cognitive Status: Within Functional Limits for tasks assessed                      General Comments General comments (skin integrity, edema, etc.): I encouraged pt to walk and told RN she is steady enough to walk the halls with her IV pole unsupervised.           Assessment/Plan    PT Assessment Patent does not need any further PT services  PT Diagnosis Generalized weakness         PT Goals (Current goals can be found in the Care  Plan section) Acute Rehab PT Goals Patient Stated Goal: to feel better and go home PT Goal Formulation: All assessment and education complete, DC therapy               End of Session Equipment Utilized During Treatment: Gait belt Activity Tolerance: Patient tolerated treatment well Patient left: in bed;with call bell/phone within reach Nurse Communication: Mobility status    Functional Assessment Tool Used: assist level Functional Limitation: Mobility: Walking and moving around Mobility: Walking and Moving Around Current Status [phone]): 0 percent impaired, limited or restricted Mobility: Walking and Moving  Around Goal Status [phone]): 0 percent impaired, limited or restricted Mobility: Walking and Moving Around Discharge Status [phone]): 0 percent impaired, limited or restricted    Time: [id number] PT Time Calculation (min) (ACUTE ONLY): 9 min   Charges:   PT Evaluation $PT Eval Low Complexity: 1 Procedure     PT G Codes:   PT G-Codes **NOT FOR INPATIENT CLASS** Functional Assessment Tool Used: assist level Functional Limitation: Mobility: Walking and moving around Mobility: Walking and Moving Around Current Status [id number]): 0 percent impaired, limited or restricted Mobility: Walking and Moving Around Goal Status [id number]): 0 percent impaired, limited or restricted Mobility: Walking and Moving Around Discharge Status [id number]): 0 percent impaired, limited or restricted    Sharise Lippy B. [place], [place], DPT [phone]   11/01/2015, 5:57 PM

## 2015-11-01 NOTE — Progress Notes (Addendum)
Progress Note    Mandy Ortiz  [id number] DOB: Jun 02, 1940  DOA: 10/30/2015 PCP: Lujean Amel, MD    Brief Narrative:   Chief complaint: Follow-up diarrhea and left leg pain/cellulitis  Mandy Ortiz is an 75 y.o. female the [place] of stable hypertension, hyperlipidemia and recent injury to her left leg 3 weeks ago complicated by cellulitis, managed with Keflex and doxycycline as an outpatient, who was admitted 10/30/15 with ongoing severe left leg pain and a 3 day history of profuse diarrhea associated with diffuse abdominal pain suspicious for C. difficile.  Assessment/Plan:   Principal Problems:   Diarrhea and abdominal pain patient with history of antibiotic exposure Patient was put on oral vancomycin empirically. No abnormalities in the colon found on CT. Continue to hydrate. Stools observed to be formed.  D/C oral vancomycin, no need to send stool studies.  D/C enteric precautions.    Traumatic hematoma of lower leg with infection Currently being managed with IV vancomycin. Follow-up blood cultures. Creatinine up today. Continue IV fluids. Monitor closely given renal toxicity of vancomycin.    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 LFTs/lipase not elevated. MRCP subsequently ordered. Images personally reviewed. Findings showed multiple hypervascular lesions in the liver, likely benign, but outpatient hepatic MRI recommended after resolution of acute symptoms. 12 mm cystic lesion in the head of the pancreas appears to be benign. Repeat MRI without and with contrast in 12 months recommended. No choledoholelithiasis or cholelithiasis. Patient advised of need to F/U.  Active Problems:   Fecal occult blood test positive Consider outpatient GI evaluation. Continue  Pepcid.    Acute kidney injury New problem. Bump in creatinine noted over the past 24 hours. Continue IV fluids. Hold Avapro.    Left lung nodule Incidentally discovered on MRI. Noncontrast CT of the chest at 6-12 months recommended.    H/O Bells Palsy    Headache Tylenol ordered as needed. Will ask RN to bring a dose of oxycodone.    Essential hypertension Currently being managed with amlodipine and irbesartan. Maxide currently on hold. Blood pressure suboptimally controlled. Continue hydralazine as needed. Hold irbesartan and continue to hold Maxide given acute kidney injury.     GERD (gastroesophageal reflux disease) with esophagitis CT showed esophagitis. PPI on hold with concerns for C. difficile. Continue Pepcid and Carafate.     Normocytic anemia No deficiencies noted on anemia panel. Heme check stools.   Family Communication/Anticipated D/C date and plan/Code Status   DVT prophylaxis: SCDs ordered. Code Status: Full Code.  Family Communication: No family at the bedside. Disposition Plan: Lives alone, daughter stays with her. Home in 24 hours if no further diarrhea.   Medical Consultants:    None.   Procedures:    None  Anti-Infectives:    Vancomycin 10/30/15 (oral and IV) --->  Subjective:   The patient reports that she soiled herself last night, having frequent stools. BM in bathroom noted to be brown and formed. Left leg pain improved. Review of symptoms is positive for ongoing diarrhea, sore abdomen, and headache and negative for fever, chills, reflux symptoms.  Objective:    Vitals:   10/31/15 0341 10/31/15 1410 10/31/15 2121 11/01/15 0415  BP: (!) 169/76 131/60 (!) 136/58 (!) 154/60  Pulse:  95 64 69  Resp:  17 18 18   Temp: 97.9  F (36.6 C) 98.7 F (37.1 C) 98.4 F (36.9 C) 98.2 F (36.8 C)  TempSrc: Oral Oral Oral Oral  SpO2:  99% 97% 100%  Weight:        Intake/Output Summary (Last 24 hours) at 11/01/15 0750 Last data filed at  11/01/15 0408  Gross per 24 hour  Intake              480 ml  Output                0 ml  Net              480 ml   Filed Weights   10/30/15 1257  Weight: 67.1 kg (148 lb)    Exam: General exam: Appears calm and comfortable.  Respiratory system: Clear to auscultation. Respiratory effort normal. Cardiovascular system: S1 & S2 heard, RRR. No JVD,  rubs, gallops or clicks. II/VI systolic murmur. Gastrointestinal system: Abdomen is nondistended, soft and nontender. No organomegaly or masses felt. Normal bowel sounds heard. Central nervous system: Alert and oriented. Right facial weakness (h/o Bell's palsy) with a facial tic noted. Extremities: No clubbing,  or cyanosis. No edema. Skin: No rashes.  Left anterior shin with 8 cm area of induration/increased warmth. Psychiatry: Judgement and insight appear normal. Mood & affect appropriate.   Data Reviewed:   I have personally reviewed following labs and imaging studies:  Labs: Basic Metabolic Panel:  Recent Labs Lab 10/30/15 1314 10/31/15 0743 11/01/15 0524  NA 141 141 139  K 4.1 4.2 3.7  CL 110 108 108  CO2 24 24 25   GLUCOSE 88 91 103*  BUN 16 11 19   CREATININE 0.86 0.78 1.10*  CALCIUM 9.2 9.2 9.1   GFR Estimated Creatinine Clearance: 42 mL/min (by C-G formula based on SCr of 1.1 mg/dL). Liver Function Tests:  Recent Labs Lab 10/30/15 1314  AST 26  ALT 17  ALKPHOS 101  BILITOT 0.7  PROT 6.8  ALBUMIN 3.5    Recent Labs Lab 10/30/15 1314  LIPASE 28    CBC:  Recent Labs Lab 10/30/15 1314 10/31/15 0743 11/01/15 0524  WBC 8.9 8.7 7.4  HGB 10.6* 10.2* 10.3*  HCT 34.8* 32.9* 33.8*  MCV 87.4 86.1 87.6  PLT 229 200 210   CBG:  Recent Labs Lab 10/31/15 0648 10/31/15 0752 11/01/15 0426  GLUCAP 97 96 104*   Anemia work up  Recent Labs  10/31/15 1132  VITAMINB12 4,656*  FOLATE 37.1  FERRITIN 55  TIBC 316  IRON 67  RETICCTPCT 1.8   Microbiology Recent Results (from the past 240 hour(s))   Culture, blood (Routine X 2) w Reflex to ID Panel     Status: None (Preliminary result)   Collection Time: 10/30/15 10:04 PM  Result Value Ref Range Status   Specimen Description BLOOD RIGHT ARM  Final   Special Requests IN PEDIATRIC BOTTLE 3ML  Final   Culture NO GROWTH < 12 HOURS  Final   Report Status PENDING  Incomplete  Culture, blood (Routine X 2) w Reflex to ID Panel     Status: None (Preliminary result)   Collection Time: 10/30/15 10:13 PM  Result Value Ref Range Status   Specimen Description BLOOD LEFT HAND  Final   Special Requests IN PEDIATRIC BOTTLE 1.5ML  Final   Culture NO GROWTH < 12 HOURS  Final   Report Status PENDING  Incomplete   Microbiology Recent Results (from the past 240 hour(s))  Culture, blood (Routine X 2) w  Reflex to ID Panel     Status: None (Preliminary result)   Collection Time: 10/30/15 10:04 PM  Result Value Ref Range Status   Specimen Description BLOOD RIGHT ARM  Final   Special Requests IN PEDIATRIC BOTTLE 3ML  Final   Culture NO GROWTH < 12 HOURS  Final   Report Status PENDING  Incomplete  Culture, blood (Routine X 2) w Reflex to ID Panel     Status: None (Preliminary result)   Collection Time: 10/30/15 10:13 PM  Result Value Ref Range Status   Specimen Description BLOOD LEFT HAND  Final   Special Requests IN PEDIATRIC BOTTLE 1.5ML  Final   Culture NO GROWTH < 12 HOURS  Final   Report Status PENDING  Incomplete    Radiology: Dg Tibia/fibula Left  Result Date: 10/30/2015 CLINICAL DATA:  75 year old female with injury to left comment. Midshaft left leg wound. EXAM: LEFT TIBIA AND FIBULA - 2 VIEW COMPARISON:  Radiograph dated 10/15/2015 FINDINGS: There is no acute fracture or dislocation. The bones are mildly osteopenic. No significant arthritic changes. The soft tissues appear unremarkable. No radiopaque foreign object or soft tissue gas identified. IMPRESSION: No acute findings. Electronically Signed   By: Anner Crete M.D.   On: 10/30/2015  18:47   Ct Abdomen Pelvis W Contrast  Result Date: 10/31/2015 CLINICAL DATA:  Subacute onset of generalized abdominal pain. Initial encounter. EXAM: CT ABDOMEN AND PELVIS WITH CONTRAST TECHNIQUE: Multidetector CT imaging of the abdomen and pelvis was performed using the standard protocol following bolus administration of intravenous contrast. CONTRAST:  186mL ISOVUE-300 IOPAMIDOL (ISOVUE-300) INJECTION 61% COMPARISON:  None. FINDINGS: Lower chest: Minimal bibasilar atelectasis is noted. The visualized portions of the mediastinum are unremarkable. Wall thickening is noted along the distal esophagus, concerning for esophagitis. Hepatobiliary: There is mild intrahepatic biliary ductal dilatation, with dilatation of the common bile duct to 1.6 cm. The gallbladder is unremarkable in appearance. Pancreas: A 1.2 cm cystic structure is noted at the proximal body of the pancreas. The pancreas is otherwise grossly unremarkable. Spleen: The spleen is unremarkable in appearance. Adrenals/Urinary Tract: The adrenal glands are unremarkable in appearance. Small bilateral renal cysts are seen. The kidneys are otherwise unremarkable. There is no evidence of hydronephrosis. No renal or ureteral stones are identified. No perinephric stranding is seen. Stomach/Bowel: The stomach is unremarkable in appearance. The small bowel is grossly unremarkable, with postoperative change noted at the mid to distal ileum. The appendix is not visualized; there is no evidence for appendicitis. Minimal diverticulosis is noted along the sigmoid colon, without evidence of diverticulitis. Vascular/Lymphatic: Scattered calcification is seen along the abdominal aorta and its branches. The abdominal aorta is otherwise grossly unremarkable. The inferior vena cava is grossly unremarkable. No retroperitoneal lymphadenopathy is seen. No pelvic sidewall lymphadenopathy is identified. Reproductive: The bladder is moderately distended and grossly unremarkable.  The patient is status post hysterectomy. No suspicious adnexal masses are seen. Other: No additional soft tissue abnormalities are characterized. Musculoskeletal: No acute osseous abnormalities are identified. Vacuum phenomenon is noted at L5-S1. The visualized musculature is grossly unremarkable. IMPRESSION: 1. Wall thickening along the distal esophagus raises concern for esophagitis. 2. Mild intrahepatic biliary ductal dilatation, with dilatation of the common bile duct to 1.6 cm, concerning for distal obstruction. MRCP is recommended for further evaluation. 3. **An incidental finding of potential clinical significance has been found. 1.2 cm cystic structure at the proximal body of the pancreas. Would correlate with pancreatic lab values, and evaluate further on MRCP.** 4. Small  bilateral renal cysts seen. 5. Minimal diverticulosis along the sigmoid colon, without evidence of diverticulitis. Electronically Signed   By: Garald Balding M.D.   On: 10/31/2015 03:32   Mr 3d Recon At Scanner  Result Date: 10/31/2015 CLINICAL DATA:  Intra and extrahepatic biliary duct dilatation on CT scan earlier today. 12 mm pancreatic cyst. EXAM: MRI ABDOMEN WITHOUT CONTRAST  (INCLUDING MRCP) TECHNIQUE: Multiplanar multisequence MR imaging of the abdomen was performed. Heavily T2-weighted images of the biliary and pancreatic ducts were obtained, and three-dimensional MRCP images were rendered by post processing. COMPARISON:  CT scan earlier today. Virtual colonoscopy study from 11/05/2012. FINDINGS: Lower chest: 8 mm posterior left lung nodule noted (see image 11 series 4). Hepatobiliary: Evaluation of the liver is degraded by patient breathing motion. Multiple hypervascular lesions are identified in the liver, scattered through both hepatic lobes and ranging in size from 6 mm up to a dominant lesion in the dome of the liver measuring 3 cm. This dominant lesion in the hepatic dome was very subtle, but perceptible on the study from  11/05/2012 when it measured 2.3 cm. Other subtle hypo attenuating lesions scattered through the liver parenchyma were seen on the 2014 exam as well. Gallbladder is unremarkable. Extrahepatic common duct measures 9 mm in diameter today. Common bile duct in the head of the pancreas measures 7 mm which is upper normal for patient age. Pancreas: 12 mm cystic lesion in the head of the pancreas has homogeneous high signal intensity on T2 weighted imaging and and shows no evidence for enhancement after IV contrast administration. Relationship of this lesion to the main pancreatic duct cannot be evaluated secondary to motion artifact. There is no evidence for an enhancing mass in the pancreatic parenchyma. No dilatation of the main pancreatic duct. Spleen: No splenomegaly. No focal mass lesion. Adrenals/Urinary Tract: No adrenal nodule or mass. Multiple simple cysts are noted in the kidneys bilaterally. There is no hydronephrosis. Stomach/Bowel: Stomach is unremarkable. Duodenum is normally positioned as is the ligament of Treitz. Visualize small bowel and colon of the abdomen is nondilated. Vascular/Lymphatic: No abdominal aortic aneurysm. There is no gastrohepatic or hepatoduodenal ligament lymphadenopathy. No intraperitoneal or retroperitoneal lymphadenopathy. Other: No intraperitoneal free fluid. Musculoskeletal: No abnormal marrow enhancement within the visualized bony anatomy. IMPRESSION: 1. Multiple hypervascular lesions are identified throughout the liver. The largest lesions today were barely perceptible on the noncontrast CT scan from 11/05/2012 and have progressed only mildly in the interval. As such, these likely represent benign disease but cannot be definitively characterized on today's study. Dedicated hepatic MRI with hepatocyte specific contrast agent (Eovist) may prove helpful to further evaluate. This exam should be performed after resolution of acute symptoms and could be performed on an outpatient basis  when the patient would be best able to cooperate with positioning and reproducible breath holding as motion artifact substantially degrades image quality on today's study. 2. 12 mm cystic lesion in the head of the pancreas has simple features and is likely benign. Repeat MRI without and with contrast in 12 months is recommended. This recommendation follows ACR consensus guidelines: Management of Incidental Pancreatic Cysts: A White Paper of the ACR Incidental Findings Committee. J Am Coll Radiol [id number]. 3. Mild prominence of the extrahepatic common duct with upper normal diameter of the common bile duct. No obstructing pancreatic head mass or evidence of choledocholithiasis. No cholelithiasis. 4. **An incidental finding of potential clinical significance has been found. 8 mm left lung nodule. Non-contrast chest CT at 6-12 months is  recommended. If the nodule is stable at time of repeat CT, then future CT at 18-24 months (from today's scan) is considered optional for low-risk patients, but is recommended for high-risk patients. This recommendation follows the consensus statement: Guidelines for Management of Incidental Pulmonary Nodules Detected on CT Images:From the Fleischner Society 2017; published online before print (10.1148/radiol.SG:5268862).** Electronically Signed   By: Misty Stanley M.D.   On: 10/31/2015 11:27   [place] Extrem Low Left Ltd  Result Date: 10/30/2015 CLINICAL DATA:  [place] from ladder 3 weeks ago. Assess for anterior tibia/fibula infection or hematoma. EXAM: ULTRASOUND LEFT LOWER EXTREMITY LIMITED TECHNIQUE: Ultrasound examination of the lower extremity soft tissues was performed in the area of clinical concern. COMPARISON:  LEFT tibia and fibula radiographs October 30, 2015 at 1812 hours FINDINGS: Heterogeneous, predominately hypoechoic 5.1 x 0.7 x 2.1 cm avascular fluid collection within pretibial soft tissues corresponding to area of redness and swelling. Fluid collection is  approximate 4 mm deep to the skin surface. IMPRESSION: Avascular 5.1 x 0.7 x 2.1 cm pretibial fluid collection, in the setting of trauma this likely represents resolving hematoma. No definite abscess though, this could be confirmed with sampling as clinically indicated. Electronically Signed   By: Elon Alas M.D.   On: 10/30/2015 20:31   Mr Lambert Mody Cm/mrcp  Result Date: 10/31/2015 CLINICAL DATA:  Intra and extrahepatic biliary duct dilatation on CT scan earlier today. 12 mm pancreatic cyst. EXAM: MRI ABDOMEN WITHOUT CONTRAST  (INCLUDING MRCP) TECHNIQUE: Multiplanar multisequence MR imaging of the abdomen was performed. Heavily T2-weighted images of the biliary and pancreatic ducts were obtained, and three-dimensional MRCP images were rendered by post processing. COMPARISON:  CT scan earlier today. Virtual colonoscopy study from 11/05/2012. FINDINGS: Lower chest: 8 mm posterior left lung nodule noted (see image 11 series 4). Hepatobiliary: Evaluation of the liver is degraded by patient breathing motion. Multiple hypervascular lesions are identified in the liver, scattered through both hepatic lobes and ranging in size from 6 mm up to a dominant lesion in the dome of the liver measuring 3 cm. This dominant lesion in the hepatic dome was very subtle, but perceptible on the study from 11/05/2012 when it measured 2.3 cm. Other subtle hypo attenuating lesions scattered through the liver parenchyma were seen on the 2014 exam as well. Gallbladder is unremarkable. Extrahepatic common duct measures 9 mm in diameter today. Common bile duct in the head of the pancreas measures 7 mm which is upper normal for patient age. Pancreas: 12 mm cystic lesion in the head of the pancreas has homogeneous high signal intensity on T2 weighted imaging and and shows no evidence for enhancement after IV contrast administration. Relationship of this lesion to the main pancreatic duct cannot be evaluated secondary to motion artifact.  There is no evidence for an enhancing mass in the pancreatic parenchyma. No dilatation of the main pancreatic duct. Spleen: No splenomegaly. No focal mass lesion. Adrenals/Urinary Tract: No adrenal nodule or mass. Multiple simple cysts are noted in the kidneys bilaterally. There is no hydronephrosis. Stomach/Bowel: Stomach is unremarkable. Duodenum is normally positioned as is the ligament of Treitz. Visualize small bowel and colon of the abdomen is nondilated. Vascular/Lymphatic: No abdominal aortic aneurysm. There is no gastrohepatic or hepatoduodenal ligament lymphadenopathy. No intraperitoneal or retroperitoneal lymphadenopathy. Other: No intraperitoneal free fluid. Musculoskeletal: No abnormal marrow enhancement within the visualized bony anatomy. IMPRESSION: 1. Multiple hypervascular lesions are identified throughout the liver. The largest lesions today were barely perceptible on the noncontrast CT scan  from 11/05/2012 and have progressed only mildly in the interval. As such, these likely represent benign disease but cannot be definitively characterized on today's study. Dedicated hepatic MRI with hepatocyte specific contrast agent (Eovist) may prove helpful to further evaluate. This exam should be performed after resolution of acute symptoms and could be performed on an outpatient basis when the patient would be best able to cooperate with positioning and reproducible breath holding as motion artifact substantially degrades image quality on today's study. 2. 12 mm cystic lesion in the head of the pancreas has simple features and is likely benign. Repeat MRI without and with contrast in 12 months is recommended. This recommendation follows ACR consensus guidelines: Management of Incidental Pancreatic Cysts: A White Paper of the ACR Incidental Findings Committee. J Am Coll Radiol [id number]. 3. Mild prominence of the extrahepatic common duct with upper normal diameter of the common bile duct. No  obstructing pancreatic head mass or evidence of choledocholithiasis. No cholelithiasis. 4. **An incidental finding of potential clinical significance has been found. 8 mm left lung nodule. Non-contrast chest CT at 6-12 months is recommended. If the nodule is stable at time of repeat CT, then future CT at 18-24 months (from today's scan) is considered optional for low-risk patients, but is recommended for high-risk patients. This recommendation follows the consensus statement: Guidelines for Management of Incidental Pulmonary Nodules Detected on CT Images:From the Fleischner Society 2017; published online before print (10.1148/radiol.SG:5268862).** Electronically Signed   By: Misty Stanley M.D.   On: 10/31/2015 11:27    Medications:   . amLODipine  10 mg Oral Daily  . aspirin EC  81 mg Oral Daily  . calcium-vitamin D  1 tablet Oral Daily  . famotidine  20 mg Oral Daily  . irbesartan  300 mg Oral Daily  . simvastatin  20 mg Oral QHS  . vancomycin  1,250 mg Intravenous Q24H  . vancomycin  125 mg Oral QID   Continuous Infusions: . sodium chloride 100 mL/hr at 10/30/15 2318    Medical decision making is of high complexity and this patient is at high risk of deterioration, therefore this is a level 3 visit.     LOS: 0 days   Tiberius Loftus  [facility] Pager [phone]. If unable to reach me by pager, please call my cell phone at [phone].  *Please refer to [email or web address], password [email or web address] to get updated schedule on who will round on this patient, as hospitalists switch teams weekly. If 7PM-7AM, please contact night-coverage at [email or web address], password [email or web address] for any overnight needs.  11/01/2015, 7:50 AM

## 2015-11-01 NOTE — Progress Notes (Signed)
Nurse entered room and noted IV pump had been turned off. Nurse asked the patient if she had turned off the fluids. Patient stated "yes". Nurse educated the patient of the importance of the fluids and that the doctor had ordered for her to be on IV fluids continuously.  Patient stated "it was beeping". Nurse explained that the patient should call if the iv pump is beeping.  Patient voiced understanding. IV fluids turned back on. Nursing to continue to monitor.

## 2015-11-02 ENCOUNTER — Encounter (HOSPITAL_COMMUNITY): Payer: Self-pay | Admitting: Gastroenterology

## 2015-11-02 DIAGNOSIS — R1084 Generalized abdominal pain: Secondary | ICD-10-CM | POA: Diagnosis not present

## 2015-11-02 DIAGNOSIS — I10 Essential (primary) hypertension: Secondary | ICD-10-CM | POA: Diagnosis not present

## 2015-11-02 DIAGNOSIS — R195 Other fecal abnormalities: Secondary | ICD-10-CM

## 2015-11-02 DIAGNOSIS — R197 Diarrhea, unspecified: Secondary | ICD-10-CM | POA: Diagnosis not present

## 2015-11-02 DIAGNOSIS — N179 Acute kidney failure, unspecified: Secondary | ICD-10-CM | POA: Diagnosis not present

## 2015-11-02 LAB — BASIC METABOLIC PANEL
Anion gap: 8 (ref 5–15)
BUN: 15 mg/dL (ref 6–20)
CO2: 27 mmol/L (ref 22–32)
CREATININE: 0.85 mg/dL (ref 0.44–1.00)
Calcium: 9.4 mg/dL (ref 8.9–10.3)
Chloride: 106 mmol/L (ref 101–111)
GFR calc Af Amer: >60 mL/min (ref >60)
GLUCOSE: 97 mg/dL (ref 65–99)
Potassium: 4.1 mmol/L (ref 3.5–5.1)
SODIUM: 141 mmol/L (ref 135–145)

## 2015-11-02 LAB — VANCOMYCIN, TROUGH: VANCOMYCIN TR: 10 ug/mL — AB (ref 15–20)

## 2015-11-02 LAB — GLUCOSE, CAPILLARY: Glucose-Capillary: 96 mg/dL (ref 65–99)

## 2015-11-02 MED ORDER — BUTALBITAL-APAP-CAFFEINE 50-325-40 MG PO TABS
1.0000 | ORAL_TABLET | Freq: Four times a day (QID) | ORAL | Status: DC | PRN
  Administered 2015-11-02 – 2015-11-03 (×3): 1 via ORAL
  Filled 2015-11-02 (×3): qty 1

## 2015-11-02 MED ORDER — HYOSCYAMINE SULFATE 0.125 MG SL SUBL
0.2500 mg | SUBLINGUAL_TABLET | Freq: Once | SUBLINGUAL | Status: AC
  Administered 2015-11-02: 0.25 mg via SUBLINGUAL
  Filled 2015-11-02 (×2): qty 2

## 2015-11-02 NOTE — Care Management Note (Addendum)
Case Management Note  Patient Details  Name: Mandy Ortiz MRN: [id number] Date of Birth: January 31, 1941  Subjective/Objective:                    Action/Plan:  Consult for home health needs. PT recommendation no PT follow up or DME   Discussed discharge with patient , she states she has no needs. Expected Discharge Date:                  Expected Discharge Plan:  Home/Self Care  In-House Referral:     Discharge planning Services  CM Consult  Post Acute Care Choice:  Home Health Choice offered to:     DME Arranged:    DME Agency:     HH Arranged:    [place] Agency:     Status of Service:  Completed, signed off  If discussed at [employer] of Stay Meetings, dates discussed:    Additional Comments:  Marilu Favre, RN 11/02/2015, 7:51 AM

## 2015-11-02 NOTE — Consult Note (Signed)
Reason for Consult: Guaiac positivity abnormal MRI of the pancreas Referring Physician: Hospital team  Mandy Ortiz is an 75 y.o. female.  HPI: Patient seen and examined and her hospital computer chart and our office computer chart was reviewed including 3 previous endoscopies a colonoscopy and a virtual colonoscopy in [place] as well as her labs and x-rays here and currently her abdominal pain is not too bad and her diarrhea is better and she believes that is due to her recent antibiotics and she has not seen any blood and is on a aspirin a day at home and is not iron deficient and although cancer runs in her family she cannot say what kind and she has no new complaints  Past Medical History:  Diagnosis Date  . GERD (gastroesophageal reflux disease)   . Hiatal hernia   . Hyperlipidemia   . Hypertension   . Migraine     Past Surgical History:  Procedure Laterality Date  . ABDOMINAL HYSTERECTOMY    . ABDOMINAL SURGERY    . COLON SURGERY      Family History  Problem Relation Age of Onset  . Hypertension Mother   . Hypertension Father   . Hypertension Brother     Social History:  reports that she has never smoked. She has never used smokeless tobacco. She reports that she does not drink alcohol or use drugs.  Allergies:  Allergies  Allergen Reactions  . Sulfa Antibiotics Hives and Swelling  . Sulfamethoxazole Hives and Swelling    Medications: I have reviewed the patient's current medications.  Results for orders placed or performed during the hospital encounter of 10/30/15 (from the past 48 hour(s))  Glucose, capillary     Status: Abnormal   Collection Time: 11/01/15  4:26 AM  Result Value Ref Range   Glucose-Capillary 104 (H) 65 - 99 mg/dL  CBC     Status: Abnormal   Collection Time: 11/01/15  5:24 AM  Result Value Ref Range   WBC 7.4 4.0 - 10.5 K/uL   RBC 3.86 (L) 3.87 - 5.11 MIL/uL   Hemoglobin 10.3 (L) 12.0 - 15.0 g/dL   HCT 33.8 (L) 36.0 - 46.0 %   MCV 87.6  78.0 - 100.0 fL   MCH 26.7 26.0 - 34.0 pg   MCHC 30.5 30.0 - 36.0 g/dL   RDW 15.5 11.5 - 15.5 %   Platelets 210 150 - 400 K/uL  Basic metabolic panel     Status: Abnormal   Collection Time: 11/01/15  5:24 AM  Result Value Ref Range   Sodium 139 135 - 145 mmol/L   Potassium 3.7 3.5 - 5.1 mmol/L   Chloride 108 101 - 111 mmol/L   CO2 25 22 - 32 mmol/L   Glucose, Bld 103 (H) 65 - 99 mg/dL   BUN 19 6 - 20 mg/dL   Creatinine, Ser 1.10 (H) 0.44 - 1.00 mg/dL   Calcium 9.1 8.9 - 10.3 mg/dL   GFR calc non Af Amer 48 (L) >60 mL/min   GFR calc Af Amer 56 (L) >60 mL/min    Comment: (NOTE) The eGFR has been calculated using the CKD EPI equation. This calculation has not been validated in all clinical situations. eGFR's persistently <60 mL/min signify possible Chronic Kidney Disease.    Anion gap 6 5 - 15  Occult blood card to lab, stool RN will collect     Status: Abnormal   Collection Time: 11/01/15 12:15 PM  Result Value Ref Range  Fecal Occult Bld POSITIVE (A) NEGATIVE  Basic metabolic panel     Status: None   Collection Time: 11/02/15  3:25 AM  Result Value Ref Range   Sodium 141 135 - 145 mmol/L   Potassium 4.1 3.5 - 5.1 mmol/L   Chloride 106 101 - 111 mmol/L   CO2 27 22 - 32 mmol/L   Glucose, Bld 97 65 - 99 mg/dL   BUN 15 6 - 20 mg/dL   Creatinine, Ser 0.85 0.44 - 1.00 mg/dL   Calcium 9.4 8.9 - 10.3 mg/dL   GFR calc non Af Amer >60 >60 mL/min   GFR calc Af Amer >60 >60 mL/min    Comment: (NOTE) The eGFR has been calculated using the CKD EPI equation. This calculation has not been validated in all clinical situations. eGFR's persistently <60 mL/min signify possible Chronic Kidney Disease.    Anion gap 8 5 - 15  Glucose, capillary     Status: None   Collection Time: 11/02/15  3:39 AM  Result Value Ref Range   Glucose-Capillary 96 65 - 99 mg/dL    No results found.  ROSNegative except above  Blood pressure 136/63, pulse (!) 51, temperature 98.2 F (36.8 C),  temperature source Oral, resp. rate 18, weight 67.1 kg (148 lb), SpO2 99 %. Physical ExamVital signs stable afebrile no acute distress lungs are clear heart regular rate and rhythm abdomen is soft nontender labs pertinent for stable hemoglobin not iron deficient normal white count CT without signs of colitis MRCP with pancreatic cyst and minimally changed liver lesion over the years  Assessment/Plan: Multiple medical problems including guaiac positivity on aspirin not iron deficient stable hemoglobin improved diarrhea probably antibiotic induced and abnormal cyst of the pancreas Plan: She should follow-up in the office with my partner Dr. Paulita Fujita to discuss elective EUS just to be sure and can follow-up  guaiacs off aspirin as an outpatient as well and please call us if we can be of any further assistance with this hospital stay and she's also seen my partner Dr. Michail Sermon in the past and could follow-up with him if she prefers and we discussed all of the above [facility] E 11/02/2015, 3:29 PM

## 2015-11-02 NOTE — Care Management Obs Status (Signed)
[place] NOTIFICATION   Patient Details  Name: Mandy Ortiz MRN: [id number] Date of Birth: 19-Feb-1941   Medicare Observation Status Notification Given:  Yes    Marilu Favre, RN 11/02/2015, 8:05 AM

## 2015-11-02 NOTE — Progress Notes (Signed)
Pharmacy Antibiotic Note  Mandy Ortiz is a 75 y.o. female admitted on 10/30/2015 with cellulitis.  Pharmacy has been consulted for vancomycin dosing. Creatinine trending back down. Vancomycin trough is therapeutic at 10 this evening (just over 24 hour level).   Plan: Continue Vancomycin 1250mg  IV every 24 hours.  Goal trough 10-15 mcg/mL.  Monitor culture data, renal function and clinical course Vancomycin trough as indicated  Weight: 148 lb (67.1 kg)  Temp (24hrs), Avg:98.6 F (37 C), Min:98.2 F (36.8 C), Max:98.8 F (37.1 C)   Recent Labs Lab 10/30/15 1314 10/30/15 1325 10/30/15 1945 10/31/15 0743 11/01/15 0524 11/02/15 0325 11/02/15 1805  WBC 8.9  --   --  8.7 7.4  --   --   CREATININE 0.86  --   --  0.78 1.10* 0.85  --   LATICACIDVEN  --  0.88 0.77  --   --   --   --   VANCOTROUGH  --   --   --   --   --   --  10*    Estimated Creatinine Clearance: 54.4 mL/min (by C-G formula based on SCr of 0.85 mg/dL).    Allergies  Allergen Reactions  . Sulfa Antibiotics Hives and Swelling  . Sulfamethoxazole Hives and Swelling    Antimicrobials this admission: 9/8 vanc IV >> 9/8 vanc PO >>  Dose adjustments this admission: n/a  Microbiology results: 9/8 BCx: ngtd    Thank you for allowing [place] to participate in this patients care. Jens Som, PharmD Pager: [phone] 11/02/2015 7:01 PM

## 2015-11-02 NOTE — Progress Notes (Signed)
New evaluation for imminent discharge received, patient was seen for evaluation previous day. Spoke to evaluating therapist (PT EVAL ON 11/01/15) Patient completely independent with No acute PT needs and therapist has signed off. Will complete new order.   Thank you  Alben Deeds, PT DPT [phone]

## 2015-11-02 NOTE — Progress Notes (Signed)
Progress Note    Sira Mielke  [id number] DOB: 26-Dec-1940  DOA: 10/30/2015 PCP: Lujean Amel, MD    Brief Narrative:   Chief complaint: Follow-up diarrhea and left leg pain/cellulitis  Jomaira Demian is an 75 y.o. female the [place] of stable hypertension, hyperlipidemia and recent injury to her left leg 3 weeks ago complicated by cellulitis, managed with Keflex and doxycycline as an outpatient, who was admitted 10/30/15 with ongoing severe left leg pain and a 3 day history of profuse diarrhea associated with diffuse abdominal pain suspicious for C. difficile.  Assessment/Plan:   Principal Problems:   Diarrhea and abdominal pain patient with history of antibiotic exposure Patient was put on oral vancomycin empirically. No abnormalities in the colon found on CT, but does have fecal occult + stools and a normocytic anemia. Stools observed to be formed, therefore C. difficile unlikely. Continues to have crampy abdominal pain, right sided.GI consulted for further evaluation given fecal occult positive stools and anemia.    Traumatic hematoma of lower leg with infection Currently being managed with IV vancomycin. Blood cultures remain negative. Creatinine normalized today. Continue IV fluids. Monitor closely given renal toxicity of vancomycin.    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 LFTs/lipase not elevated. MRCP subsequently ordered.  Findings showed multiple hypervascular lesions in the liver, likely benign, but outpatient hepatic MRI recommended after resolution of acute symptoms. 12 mm cystic lesion in the head of the pancreas appears to be benign. Repeat MRI without and with contrast in 12 months recommended. No choledoholelithiasis or cholelithiasis.   Active Problems:  Fecal occult blood test positive Will get inpatient GI evaluation given ongoing complaints of abdominal pain. Continue Pepcid.    Acute kidney injury Resolved with IV fluids and holding Avapro.    Left lung nodule Incidentally discovered on MRI. Noncontrast CT of the chest at 6-12 months recommended.    H/O Bells Palsy    Headache Will try Fioricet given persistent headache.    Essential hypertension Currently being managed with amlodipine and as needed hydralazine.  Maxide and irbesartan currently on hold.     GERD (gastroesophageal reflux disease) with esophagitis CT showed esophagitis. PPI on hold with concerns for C. difficile. Continue Pepcid and Carafate.     Normocytic anemia No deficiencies noted on anemia panel. Stools are guaiac positive. Hemoglobin has been stable 72 hours. GI consulted.  Family Communication/Anticipated D/C date and plan/Code Status   DVT prophylaxis: SCDs ordered. Code Status: Full Code.  Family Communication: No family at the bedside. Disposition Plan: Lives alone, daughter stays with her. Home in 24 hours if no further diarrhea.   Medical Consultants:    GI   Procedures:    None  Anti-Infectives:    Vancomycin 10/30/15--->  Subjective:   The patient reports that she has been having left sided abdominal pain, crampy, 10/10 at worst, intermittent. Left leg pain improved. Still says she feels like "pins are sticking in it". Review of symptoms is positive for ongoing headache (pain 5/10) and negative for fever, chills, reflux symptoms.  Objective:    Vitals:   11/01/15 1914 11/02/15 0332 11/02/15 0437 11/02/15 0753  BP: (!) 160/65 (!) 186/64 (!) 168/76 136/63  Pulse: 62 (!) 58  (!) 51  Resp: 18 18    Temp: 98.8 F (37.1 C) 98.2  F (36.8 C)    TempSrc: Oral Oral    SpO2: 98% 99%    Weight:       No intake or output data in the 24 hours ending 11/02/15 0843 Filed Weights   10/30/15 1257  Weight: 67.1 kg (148 lb)     Exam: General exam: Appears calm and comfortable.  Respiratory system: Clear to auscultation. Respiratory effort normal. Cardiovascular system: S1 & S2 heard, RRR. No JVD,  rubs, gallops or clicks. II/VI systolic murmur. Gastrointestinal system: Abdomen is nondistended, soft and tender right lower quadrant. No organomegaly or masses felt. Normal bowel sounds heard. Central nervous system: Alert and oriented. Right facial weakness (h/o Bell's palsy). Extremities: No clubbing,  or cyanosis. No edema. Skin: No rashes.  Left anterior shin with 8 cm area of induration/increased warmth. Psychiatry: Judgement and insight appear normal. Mood & affect appropriate.   Data Reviewed:   I have personally reviewed following labs and imaging studies:  Labs: Basic Metabolic Panel:  Recent Labs Lab 10/30/15 1314 10/31/15 0743 11/01/15 0524 11/02/15 0325  NA 141 141 139 141  K 4.1 4.2 3.7 4.1  CL 110 108 108 106  CO2 24 24 25 27   GLUCOSE 88 91 103* 97  BUN 16 11 19 15   CREATININE 0.86 0.78 1.10* 0.85  CALCIUM 9.2 9.2 9.1 9.4   GFR Estimated Creatinine Clearance: 54.4 mL/min (by C-G formula based on SCr of 0.85 mg/dL). Liver Function Tests:  Recent Labs Lab 10/30/15 1314  AST 26  ALT 17  ALKPHOS 101  BILITOT 0.7  PROT 6.8  ALBUMIN 3.5    Recent Labs Lab 10/30/15 1314  LIPASE 28    CBC:  Recent Labs Lab 10/30/15 1314 10/31/15 0743 11/01/15 0524  WBC 8.9 8.7 7.4  HGB 10.6* 10.2* 10.3*  HCT 34.8* 32.9* 33.8*  MCV 87.4 86.1 87.6  PLT 229 200 210   CBG:  Recent Labs Lab 10/31/15 0648 10/31/15 0752 11/01/15 0426 11/02/15 0339  GLUCAP 97 96 104* 96   Anemia work up  Recent Labs  10/31/15 1132  VITAMINB12 4,656*  FOLATE 37.1  FERRITIN 55  TIBC 316  IRON 67  RETICCTPCT 1.8   Microbiology Recent Results (from the past 240 hour(s))  Culture, blood (Routine X 2) w Reflex to ID Panel     Status: None (Preliminary result)   Collection Time: 10/30/15  10:04 PM  Result Value Ref Range Status   Specimen Description BLOOD RIGHT ARM  Final   Special Requests IN PEDIATRIC BOTTLE 3ML  Final   Culture NO GROWTH 2 DAYS  Final   Report Status PENDING  Incomplete  Culture, blood (Routine X 2) w Reflex to ID Panel     Status: None (Preliminary result)   Collection Time: 10/30/15 10:13 PM  Result Value Ref Range Status   Specimen Description BLOOD LEFT HAND  Final   Special Requests IN PEDIATRIC BOTTLE 1.5ML  Final   Culture NO GROWTH 2 DAYS  Final   Report Status PENDING  Incomplete   Microbiology Recent Results (from the past 240 hour(s))  Culture, blood (Routine X 2) w Reflex to ID Panel     Status: None (Preliminary result)   Collection Time: 10/30/15 10:04 PM  Result Value Ref Range Status   Specimen Description BLOOD RIGHT ARM  Final   Special Requests IN PEDIATRIC BOTTLE 3ML  Final   Culture NO GROWTH 2 DAYS  Final   Report Status PENDING  Incomplete  Culture, blood (Routine  X 2) w Reflex to ID Panel     Status: None (Preliminary result)   Collection Time: 10/30/15 10:13 PM  Result Value Ref Range Status   Specimen Description BLOOD LEFT HAND  Final   Special Requests IN PEDIATRIC BOTTLE 1.5ML  Final   Culture NO GROWTH 2 DAYS  Final   Report Status PENDING  Incomplete    Radiology: Mr 3d Recon At Scanner  Result Date: 10/31/2015 CLINICAL DATA:  Intra and extrahepatic biliary duct dilatation on CT scan earlier today. 12 mm pancreatic cyst. EXAM: MRI ABDOMEN WITHOUT CONTRAST  (INCLUDING MRCP) TECHNIQUE: Multiplanar multisequence MR imaging of the abdomen was performed. Heavily T2-weighted images of the biliary and pancreatic ducts were obtained, and three-dimensional MRCP images were rendered by post processing. COMPARISON:  CT scan earlier today. Virtual colonoscopy study from 11/05/2012. FINDINGS: Lower chest: 8 mm posterior left lung nodule noted (see image 11 series 4). Hepatobiliary: Evaluation of the liver is degraded by patient  breathing motion. Multiple hypervascular lesions are identified in the liver, scattered through both hepatic lobes and ranging in size from 6 mm up to a dominant lesion in the dome of the liver measuring 3 cm. This dominant lesion in the hepatic dome was very subtle, but perceptible on the study from 11/05/2012 when it measured 2.3 cm. Other subtle hypo attenuating lesions scattered through the liver parenchyma were seen on the 2014 exam as well. Gallbladder is unremarkable. Extrahepatic common duct measures 9 mm in diameter today. Common bile duct in the head of the pancreas measures 7 mm which is upper normal for patient age. Pancreas: 12 mm cystic lesion in the head of the pancreas has homogeneous high signal intensity on T2 weighted imaging and and shows no evidence for enhancement after IV contrast administration. Relationship of this lesion to the main pancreatic duct cannot be evaluated secondary to motion artifact. There is no evidence for an enhancing mass in the pancreatic parenchyma. No dilatation of the main pancreatic duct. Spleen: No splenomegaly. No focal mass lesion. Adrenals/Urinary Tract: No adrenal nodule or mass. Multiple simple cysts are noted in the kidneys bilaterally. There is no hydronephrosis. Stomach/Bowel: Stomach is unremarkable. Duodenum is normally positioned as is the ligament of Treitz. Visualize small bowel and colon of the abdomen is nondilated. Vascular/Lymphatic: No abdominal aortic aneurysm. There is no gastrohepatic or hepatoduodenal ligament lymphadenopathy. No intraperitoneal or retroperitoneal lymphadenopathy. Other: No intraperitoneal free fluid. Musculoskeletal: No abnormal marrow enhancement within the visualized bony anatomy. IMPRESSION: 1. Multiple hypervascular lesions are identified throughout the liver. The largest lesions today were barely perceptible on the noncontrast CT scan from 11/05/2012 and have progressed only mildly in the interval. As such, these likely  represent benign disease but cannot be definitively characterized on today's study. Dedicated hepatic MRI with hepatocyte specific contrast agent (Eovist) may prove helpful to further evaluate. This exam should be performed after resolution of acute symptoms and could be performed on an outpatient basis when the patient would be best able to cooperate with positioning and reproducible breath holding as motion artifact substantially degrades image quality on today's study. 2. 12 mm cystic lesion in the head of the pancreas has simple features and is likely benign. Repeat MRI without and with contrast in 12 months is recommended. This recommendation follows ACR consensus guidelines: Management of Incidental Pancreatic Cysts: A White Paper of the ACR Incidental Findings Committee. J Am Coll Radiol [id number]. 3. Mild prominence of the extrahepatic common duct with upper normal diameter of the common  bile duct. No obstructing pancreatic head mass or evidence of choledocholithiasis. No cholelithiasis. 4. **An incidental finding of potential clinical significance has been found. 8 mm left lung nodule. Non-contrast chest CT at 6-12 months is recommended. If the nodule is stable at time of repeat CT, then future CT at 18-24 months (from today's scan) is considered optional for low-risk patients, but is recommended for high-risk patients. This recommendation follows the consensus statement: Guidelines for Management of Incidental Pulmonary Nodules Detected on CT Images:From the Fleischner Society 2017; published online before print (10.1148/radiol.SG:5268862).** Electronically Signed   By: Misty Stanley M.D.   On: 10/31/2015 11:27   Mr Jeananne Felicity Penix W/wo Cm/mrcp  Result Date: 10/31/2015 CLINICAL DATA:  Intra and extrahepatic biliary duct dilatation on CT scan earlier today. 12 mm pancreatic cyst. EXAM: MRI ABDOMEN WITHOUT CONTRAST  (INCLUDING MRCP) TECHNIQUE: Multiplanar multisequence MR imaging of the abdomen was performed.  Heavily T2-weighted images of the biliary and pancreatic ducts were obtained, and three-dimensional MRCP images were rendered by post processing. COMPARISON:  CT scan earlier today. Virtual colonoscopy study from 11/05/2012. FINDINGS: Lower chest: 8 mm posterior left lung nodule noted (see image 11 series 4). Hepatobiliary: Evaluation of the liver is degraded by patient breathing motion. Multiple hypervascular lesions are identified in the liver, scattered through both hepatic lobes and ranging in size from 6 mm up to a dominant lesion in the dome of the liver measuring 3 cm. This dominant lesion in the hepatic dome was very subtle, but perceptible on the study from 11/05/2012 when it measured 2.3 cm. Other subtle hypo attenuating lesions scattered through the liver parenchyma were seen on the 2014 exam as well. Gallbladder is unremarkable. Extrahepatic common duct measures 9 mm in diameter today. Common bile duct in the head of the pancreas measures 7 mm which is upper normal for patient age. Pancreas: 12 mm cystic lesion in the head of the pancreas has homogeneous high signal intensity on T2 weighted imaging and and shows no evidence for enhancement after IV contrast administration. Relationship of this lesion to the main pancreatic duct cannot be evaluated secondary to motion artifact. There is no evidence for an enhancing mass in the pancreatic parenchyma. No dilatation of the main pancreatic duct. Spleen: No splenomegaly. No focal mass lesion. Adrenals/Urinary Tract: No adrenal nodule or mass. Multiple simple cysts are noted in the kidneys bilaterally. There is no hydronephrosis. Stomach/Bowel: Stomach is unremarkable. Duodenum is normally positioned as is the ligament of Treitz. Visualize small bowel and colon of the abdomen is nondilated. Vascular/Lymphatic: No abdominal aortic aneurysm. There is no gastrohepatic or hepatoduodenal ligament lymphadenopathy. No intraperitoneal or retroperitoneal  lymphadenopathy. Other: No intraperitoneal free fluid. Musculoskeletal: No abnormal marrow enhancement within the visualized bony anatomy. IMPRESSION: 1. Multiple hypervascular lesions are identified throughout the liver. The largest lesions today were barely perceptible on the noncontrast CT scan from 11/05/2012 and have progressed only mildly in the interval. As such, these likely represent benign disease but cannot be definitively characterized on today's study. Dedicated hepatic MRI with hepatocyte specific contrast agent (Eovist) may prove helpful to further evaluate. This exam should be performed after resolution of acute symptoms and could be performed on an outpatient basis when the patient would be best able to cooperate with positioning and reproducible breath holding as motion artifact substantially degrades image quality on today's study. 2. 12 mm cystic lesion in the head of the pancreas has simple features and is likely benign. Repeat MRI without and with contrast in 12 months  is recommended. This recommendation follows ACR consensus guidelines: Management of Incidental Pancreatic Cysts: A White Paper of the ACR Incidental Findings Committee. J Am Coll Radiol [id number]. 3. Mild prominence of the extrahepatic common duct with upper normal diameter of the common bile duct. No obstructing pancreatic head mass or evidence of choledocholithiasis. No cholelithiasis. 4. **An incidental finding of potential clinical significance has been found. 8 mm left lung nodule. Non-contrast chest CT at 6-12 months is recommended. If the nodule is stable at time of repeat CT, then future CT at 18-24 months (from today's scan) is considered optional for low-risk patients, but is recommended for high-risk patients. This recommendation follows the consensus statement: Guidelines for Management of Incidental Pulmonary Nodules Detected on CT Images:From the Fleischner Society 2017; published online before print  (10.1148/radiol.SG:5268862).** Electronically Signed   By: Misty Stanley M.D.   On: 10/31/2015 11:27    Medications:   . amLODipine  10 mg Oral Daily  . aspirin EC  81 mg Oral Daily  . calcium-vitamin D  1 tablet Oral Daily  . famotidine  20 mg Oral Daily  . simvastatin  20 mg Oral QHS  . vancomycin  1,250 mg Intravenous Q24H   Continuous Infusions: . sodium chloride 100 mL/hr at 10/30/15 2318    Patient has a new problem (Hemoccult positive stool) requiring further workup. Medical decision making is of high complexity with ongoing symptoms that have not yet improved, therefore this is a level 3 visit.     LOS: 0 days   Jiles Goya  [facility] Pager [phone]. If unable to reach me by pager, please call my cell phone at [phone].  *Please refer to [email or web address], password [email or web address] to get updated schedule on who will round on this patient, as hospitalists switch teams weekly. If 7PM-7AM, please contact night-coverage at [email or web address], password [email or web address] for any overnight needs.  11/02/2015, 8:43 AM

## 2015-11-03 DIAGNOSIS — I10 Essential (primary) hypertension: Secondary | ICD-10-CM | POA: Diagnosis not present

## 2015-11-03 DIAGNOSIS — N179 Acute kidney failure, unspecified: Secondary | ICD-10-CM | POA: Diagnosis not present

## 2015-11-03 DIAGNOSIS — R197 Diarrhea, unspecified: Secondary | ICD-10-CM | POA: Diagnosis not present

## 2015-11-03 DIAGNOSIS — R1084 Generalized abdominal pain: Secondary | ICD-10-CM | POA: Diagnosis not present

## 2015-11-03 LAB — BASIC METABOLIC PANEL
Anion gap: 9 (ref 5–15)
BUN: 17 mg/dL (ref 6–20)
CALCIUM: 9.1 mg/dL (ref 8.9–10.3)
CO2: 24 mmol/L (ref 22–32)
CREATININE: 0.85 mg/dL (ref 0.44–1.00)
Chloride: 107 mmol/L (ref 101–111)
GFR calc non Af Amer: >60 mL/min (ref >60)
Glucose, Bld: 94 mg/dL (ref 65–99)
Potassium: 4 mmol/L (ref 3.5–5.1)
SODIUM: 140 mmol/L (ref 135–145)

## 2015-11-03 LAB — GLUCOSE, CAPILLARY: GLUCOSE-CAPILLARY: 91 mg/dL (ref 65–99)

## 2015-11-03 MED ORDER — BUTALBITAL-APAP-CAFFEINE 50-325-40 MG PO TABS: 1.0000 | ORAL_TABLET | Freq: Four times a day (QID) | ORAL | 0 refills | Status: DC | PRN

## 2015-11-03 MED ORDER — DOXYCYCLINE CALCIUM 50 MG/5ML PO SYRP
100.0000 mg | ORAL_SOLUTION | Freq: Two times a day (BID) | ORAL | Status: DC
  Administered 2015-11-03: 100 mg via ORAL
  Filled 2015-11-03: qty 10

## 2015-11-03 NOTE — Discharge Summary (Signed)
Physician Discharge Summary  Mandy Ortiz [id number] DOB: 07-05-1940 DOA: 10/30/2015  PCP: Lujean Amel, MD  Admit date: 10/30/2015 Discharge date: 11/03/2015  Admitted From: Home Discharge disposition: Home   Recommendations for Outpatient Follow-Up:   1. The patient does need outpatient follow-up given abnormal imaging studies. She will need a repeat MRI of the liver/pancreas unless she she has an endoscopic ultrasound done by GI as an outpatient. She also will need a noncontrast CT of the chest in 6-12 months to follow-up in incidentally discovered left lung nodule. 2. PCP: Please follow-up anemia. If she is persistently anemic, could consider stopping aspirin and repeating Hemoccult stool testing.   Discharge Diagnosis:   Principal Problem:    Diarrhea Active Problems:    Headache    Traumatic hematoma of lower leg with infection    Abdominal pain    Essential hypertension    GERD (gastroesophageal reflux disease)    Nodule of left lung    Liver lesions, multiple    Pancreatic lesion    History of Bell's palsy    AKI (acute kidney injury) ([place])    Positive fecal occult blood test   Discharge Condition: Improved.  Diet recommendation: Low sodium, heart healthy.    Wound care: None.   History of Present Illness:   Mandy Ortiz is an 75 y.o. female the PMH of stable hypertension, hyperlipidemia and recent injury to her left leg 3 weeks ago complicated by cellulitis, managed with Keflex and doxycycline as an outpatient, who was admitted 10/30/15 with ongoing severe left leg pain and a 3 day history of profuse diarrhea associated with diffuse abdominal pain suspicious for C. difficile.   Hospital Course by Problem:   Principal Problems:   Diarrhea and abdominal pain patient with history of antibiotic exposure Patient was put on oral vancomycin empirically. No abnormalities in the colon found on CT, but does have fecal occult + stools and a  normocytic anemia. Stools observed to be formed, therefore C. difficile felt to be unlikely. GI was consulted secondary to ongoing complaints of crampy abdominal pain, and fecal occult positive stools/anemia. No further inpatient workup recommended. Recommended follow-up guaiacs of aspirin as an outpatient.    Traumatic hematoma of lower leg with infection Managed inpatient with IV vancomycin. Blood cultures remain negative. Creatinine normalized today. Discharge home on by mouth doxycycline.    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 LFTs/lipase not elevated. MRCP subsequently ordered.  Findings showed multiple hypervascular lesions in the liver, likely benign, but outpatient hepatic MRI recommended after resolution of acute symptoms. 12 mm cystic lesion in the head of the pancreas appears to be benign. Repeat MRI without and with contrast in 12 months recommended. No choledoholelithiasis or cholelithiasis. Evaluated by GI with recommendations for follow-up with Dr. Paulita Fujita for consideration of endoscopic ultrasound.  Active Problems:   Fecal occult blood test positive Can follow-up with PCP as an outpatient for guaiac testing off aspirin, resume PPI.    Acute kidney injury Resolved with IV fluids and holding Avapro. Can resume at discharge.    Left lung nodule Incidentally discovered on MRI. Noncontrast CT of the chest at 6-12 months recommended.    H/O Bells Palsy    Headache Will try Fioricet given persistent headache.    Essential hypertension Currently being managed with amlodipine and as  needed hydralazine.  Maxide and irbesartan currently on hold. Can resume at discharge.    GERD (gastroesophageal reflux disease) with esophagitis CT showed esophagitis. Resume PPI at  discharge and can continue Carafate.     Normocytic anemia No deficiencies noted on anemia panel. Stools are guaiac positive. Hemoglobin has been stable. Follow-up guaiac testing off aspirin as an outpatient.    Medical Consultants:    Gastroenterology   Discharge Exam:   Vitals:   11/02/15 2022 11/03/15 0427  BP: (!) 147/63 (!) 143/65  Pulse: 63 (!) 54  Resp: 17 16  Temp: 98.6 F (37 C) 98.4 F (36.9 C)   Vitals:   11/02/15 1500 11/02/15 1806 11/02/15 2022 11/03/15 0427  BP: (!) 180/67 (!) 157/59 (!) 147/63 (!) 143/65  Pulse: 64 65 63 (!) 54  Resp:   17 16  Temp: 98.8 F (37.1 C)  98.6 F (37 C) 98.4 F (36.9 C)  TempSrc: Oral  Oral Oral  SpO2: 99%  99% 99%  Weight:       General exam: Appears calm and comfortable.  Respiratory system: Clear to auscultation. Respiratory effort normal. Cardiovascular system: S1 & S2 heard, RRR. No JVD,  rubs, gallops or clicks. II/VI systolic murmur. Gastrointestinal system: Abdomen is nondistended, soft and tender right lower quadrant. No organomegaly or masses felt. Normal bowel sounds heard. Central nervous system: Alert and oriented. Right facial weakness (h/o Bell's palsy). Extremities: No clubbing,  or cyanosis. No edema. Skin: No rashes.  Left anterior shin with 8 cm area of induration/increased warmth. Psychiatry: Judgement and insight appear normal. Mood & affect appropriate.    The results of significant diagnostics from this hospitalization (including imaging, microbiology, ancillary and laboratory) are listed below for reference.     Procedures and Diagnostic Studies:   Dg Tibia/fibula Left  Result Date: 10/30/2015 CLINICAL DATA:  75 year old female with injury to left comment. Midshaft left leg wound. EXAM: LEFT TIBIA AND FIBULA - 2 VIEW COMPARISON:  Radiograph dated 10/15/2015 FINDINGS: There is no acute fracture or dislocation. The bones are mildly osteopenic. No significant arthritic changes. The soft tissues  appear unremarkable. No radiopaque foreign object or soft tissue gas identified. IMPRESSION: No acute findings. Electronically Signed   By: Anner Crete M.D.   On: 10/30/2015 18:47   Ct Abdomen Pelvis W Contrast  Result Date: 10/31/2015 CLINICAL DATA:  Subacute onset of generalized abdominal pain. Initial encounter. EXAM: CT ABDOMEN AND PELVIS WITH CONTRAST TECHNIQUE: Multidetector CT imaging of the abdomen and pelvis was performed using the standard protocol following bolus administration of intravenous contrast. CONTRAST:  151mL ISOVUE-300 IOPAMIDOL (ISOVUE-300) INJECTION 61% COMPARISON:  None. FINDINGS: Lower chest: Minimal bibasilar atelectasis is noted. The visualized portions of the mediastinum are unremarkable. Wall thickening is noted along the distal esophagus, concerning for esophagitis. Hepatobiliary: There is mild intrahepatic biliary ductal dilatation, with dilatation of the common bile duct to 1.6 cm. The gallbladder is unremarkable in appearance. Pancreas: A 1.2 cm cystic structure is noted at the proximal body of the pancreas. The pancreas is otherwise grossly unremarkable. Spleen: The spleen is unremarkable in appearance. Adrenals/Urinary Tract: The adrenal glands are unremarkable in appearance. Small bilateral renal cysts are seen. The kidneys are otherwise unremarkable. There is no evidence of hydronephrosis. No renal or ureteral stones are identified. No perinephric stranding is seen. Stomach/Bowel: The stomach is unremarkable in appearance. The small bowel is grossly unremarkable, with postoperative change noted at the mid to distal ileum. The appendix is not visualized; there is  no evidence for appendicitis. Minimal diverticulosis is noted along the sigmoid colon, without evidence of diverticulitis. Vascular/Lymphatic: Scattered calcification is seen along the abdominal aorta and its branches. The abdominal aorta is otherwise grossly unremarkable. The inferior vena cava is grossly  unremarkable. No retroperitoneal lymphadenopathy is seen. No pelvic sidewall lymphadenopathy is identified. Reproductive: The bladder is moderately distended and grossly unremarkable. The patient is status post hysterectomy. No suspicious adnexal masses are seen. Other: No additional soft tissue abnormalities are characterized. Musculoskeletal: No acute osseous abnormalities are identified. Vacuum phenomenon is noted at L5-S1. The visualized musculature is grossly unremarkable. IMPRESSION: 1. Wall thickening along the distal esophagus raises concern for esophagitis. 2. Mild intrahepatic biliary ductal dilatation, with dilatation of the common bile duct to 1.6 cm, concerning for distal obstruction. MRCP is recommended for further evaluation. 3. **An incidental finding of potential clinical significance has been found. 1.2 cm cystic structure at the proximal body of the pancreas. Would correlate with pancreatic lab values, and evaluate further on MRCP.** 4. Small bilateral renal cysts seen. 5. Minimal diverticulosis along the sigmoid colon, without evidence of diverticulitis. Electronically Signed   By: Garald Balding M.D.   On: 10/31/2015 03:32   Mr 3d Recon At Scanner  Result Date: 10/31/2015 CLINICAL DATA:  Intra and extrahepatic biliary duct dilatation on CT scan earlier today. 12 mm pancreatic cyst. EXAM: MRI ABDOMEN WITHOUT CONTRAST  (INCLUDING MRCP) TECHNIQUE: Multiplanar multisequence MR imaging of the abdomen was performed. Heavily T2-weighted images of the biliary and pancreatic ducts were obtained, and three-dimensional MRCP images were rendered by post processing. COMPARISON:  CT scan earlier today. Virtual colonoscopy study from 11/05/2012. FINDINGS: Lower chest: 8 mm posterior left lung nodule noted (see image 11 series 4). Hepatobiliary: Evaluation of the liver is degraded by patient breathing motion. Multiple hypervascular lesions are identified in the liver, scattered through both hepatic lobes and  ranging in size from 6 mm up to a dominant lesion in the dome of the liver measuring 3 cm. This dominant lesion in the hepatic dome was very subtle, but perceptible on the study from 11/05/2012 when it measured 2.3 cm. Other subtle hypo attenuating lesions scattered through the liver parenchyma were seen on the 2014 exam as well. Gallbladder is unremarkable. Extrahepatic common duct measures 9 mm in diameter today. Common bile duct in the head of the pancreas measures 7 mm which is upper normal for patient age. Pancreas: 12 mm cystic lesion in the head of the pancreas has homogeneous high signal intensity on T2 weighted imaging and and shows no evidence for enhancement after IV contrast administration. Relationship of this lesion to the main pancreatic duct cannot be evaluated secondary to motion artifact. There is no evidence for an enhancing mass in the pancreatic parenchyma. No dilatation of the main pancreatic duct. Spleen: No splenomegaly. No focal mass lesion. Adrenals/Urinary Tract: No adrenal nodule or mass. Multiple simple cysts are noted in the kidneys bilaterally. There is no hydronephrosis. Stomach/Bowel: Stomach is unremarkable. Duodenum is normally positioned as is the ligament of Treitz. Visualize small bowel and colon of the abdomen is nondilated. Vascular/Lymphatic: No abdominal aortic aneurysm. There is no gastrohepatic or hepatoduodenal ligament lymphadenopathy. No intraperitoneal or retroperitoneal lymphadenopathy. Other: No intraperitoneal free fluid. Musculoskeletal: No abnormal marrow enhancement within the visualized bony anatomy. IMPRESSION: 1. Multiple hypervascular lesions are identified throughout the liver. The largest lesions today were barely perceptible on the noncontrast CT scan from 11/05/2012 and have progressed only mildly in the interval. As such, these likely  represent benign disease but cannot be definitively characterized on today's study. Dedicated hepatic MRI with hepatocyte  specific contrast agent (Eovist) may prove helpful to further evaluate. This exam should be performed after resolution of acute symptoms and could be performed on an outpatient basis when the patient would be best able to cooperate with positioning and reproducible breath holding as motion artifact substantially degrades image quality on today's study. 2. 12 mm cystic lesion in the head of the pancreas has simple features and is likely benign. Repeat MRI without and with contrast in 12 months is recommended. This recommendation follows ACR consensus guidelines: Management of Incidental Pancreatic Cysts: A White Paper of the ACR Incidental Findings Committee. J Am Coll Radiol [id number]. 3. Mild prominence of the extrahepatic common duct with upper normal diameter of the common bile duct. No obstructing pancreatic head mass or evidence of choledocholithiasis. No cholelithiasis. 4. **An incidental finding of potential clinical significance has been found. 8 mm left lung nodule. Non-contrast chest CT at 6-12 months is recommended. If the nodule is stable at time of repeat CT, then future CT at 18-24 months (from today's scan) is considered optional for low-risk patients, but is recommended for high-risk patients. This recommendation follows the consensus statement: Guidelines for Management of Incidental Pulmonary Nodules Detected on CT Images:From the Fleischner Society 2017; published online before print (10.1148/radiol.SG:5268862).** Electronically Signed   By: Misty Stanley M.D.   On: 10/31/2015 11:27   [place] Extrem Low Left Ltd  Result Date: 10/30/2015 CLINICAL DATA:  [place] from ladder 3 weeks ago. Assess for anterior tibia/fibula infection or hematoma. EXAM: ULTRASOUND LEFT LOWER EXTREMITY LIMITED TECHNIQUE: Ultrasound examination of the lower extremity soft tissues was performed in the area of clinical concern. COMPARISON:  LEFT tibia and fibula radiographs October 30, 2015 at 1812 hours FINDINGS:  Heterogeneous, predominately hypoechoic 5.1 x 0.7 x 2.1 cm avascular fluid collection within pretibial soft tissues corresponding to area of redness and swelling. Fluid collection is approximate 4 mm deep to the skin surface. IMPRESSION: Avascular 5.1 x 0.7 x 2.1 cm pretibial fluid collection, in the setting of trauma this likely represents resolving hematoma. No definite abscess though, this could be confirmed with sampling as clinically indicated. Electronically Signed   By: Elon Alas M.D.   On: 10/30/2015 20:31   Mr Jeananne Kadeshia Kasparian W/wo Cm/mrcp  Result Date: 10/31/2015 CLINICAL DATA:  Intra and extrahepatic biliary duct dilatation on CT scan earlier today. 12 mm pancreatic cyst. EXAM: MRI ABDOMEN WITHOUT CONTRAST  (INCLUDING MRCP) TECHNIQUE: Multiplanar multisequence MR imaging of the abdomen was performed. Heavily T2-weighted images of the biliary and pancreatic ducts were obtained, and three-dimensional MRCP images were rendered by post processing. COMPARISON:  CT scan earlier today. Virtual colonoscopy study from 11/05/2012. FINDINGS: Lower chest: 8 mm posterior left lung nodule noted (see image 11 series 4). Hepatobiliary: Evaluation of the liver is degraded by patient breathing motion. Multiple hypervascular lesions are identified in the liver, scattered through both hepatic lobes and ranging in size from 6 mm up to a dominant lesion in the dome of the liver measuring 3 cm. This dominant lesion in the hepatic dome was very subtle, but perceptible on the study from 11/05/2012 when it measured 2.3 cm. Other subtle hypo attenuating lesions scattered through the liver parenchyma were seen on the 2014 exam as well. Gallbladder is unremarkable. Extrahepatic common duct measures 9 mm in diameter today. Common bile duct in the head of the pancreas measures 7 mm which is upper normal for  patient age. Pancreas: 12 mm cystic lesion in the head of the pancreas has homogeneous high signal intensity on T2 weighted imaging  and and shows no evidence for enhancement after IV contrast administration. Relationship of this lesion to the main pancreatic duct cannot be evaluated secondary to motion artifact. There is no evidence for an enhancing mass in the pancreatic parenchyma. No dilatation of the main pancreatic duct. Spleen: No splenomegaly. No focal mass lesion. Adrenals/Urinary Tract: No adrenal nodule or mass. Multiple simple cysts are noted in the kidneys bilaterally. There is no hydronephrosis. Stomach/Bowel: Stomach is unremarkable. Duodenum is normally positioned as is the ligament of Treitz. Visualize small bowel and colon of the abdomen is nondilated. Vascular/Lymphatic: No abdominal aortic aneurysm. There is no gastrohepatic or hepatoduodenal ligament lymphadenopathy. No intraperitoneal or retroperitoneal lymphadenopathy. Other: No intraperitoneal free fluid. Musculoskeletal: No abnormal marrow enhancement within the visualized bony anatomy. IMPRESSION: 1. Multiple hypervascular lesions are identified throughout the liver. The largest lesions today were barely perceptible on the noncontrast CT scan from 11/05/2012 and have progressed only mildly in the interval. As such, these likely represent benign disease but cannot be definitively characterized on today's study. Dedicated hepatic MRI with hepatocyte specific contrast agent (Eovist) may prove helpful to further evaluate. This exam should be performed after resolution of acute symptoms and could be performed on an outpatient basis when the patient would be best able to cooperate with positioning and reproducible breath holding as motion artifact substantially degrades image quality on today's study. 2. 12 mm cystic lesion in the head of the pancreas has simple features and is likely benign. Repeat MRI without and with contrast in 12 months is recommended. This recommendation follows ACR consensus guidelines: Management of Incidental Pancreatic Cysts: A White Paper of the ACR  Incidental Findings Committee. J Am Coll Radiol [id number]. 3. Mild prominence of the extrahepatic common duct with upper normal diameter of the common bile duct. No obstructing pancreatic head mass or evidence of choledocholithiasis. No cholelithiasis. 4. **An incidental finding of potential clinical significance has been found. 8 mm left lung nodule. Non-contrast chest CT at 6-12 months is recommended. If the nodule is stable at time of repeat CT, then future CT at 18-24 months (from today's scan) is considered optional for low-risk patients, but is recommended for high-risk patients. This recommendation follows the consensus statement: Guidelines for Management of Incidental Pulmonary Nodules Detected on CT Images:From the Fleischner Society 2017; published online before print (10.1148/radiol.IJ:2314499).** Electronically Signed   By: Misty Stanley M.D.   On: 10/31/2015 11:27     Labs:   Basic Metabolic Panel:  Recent Labs Lab 10/30/15 1314 10/31/15 0743 11/01/15 0524 11/02/15 0325 11/03/15 0344  NA 141 141 139 141 140  K 4.1 4.2 3.7 4.1 4.0  CL 110 108 108 106 107  CO2 24 24 25 27 24   GLUCOSE 88 91 103* 97 94  BUN 16 11 19 15 17   CREATININE 0.86 0.78 1.10* 0.85 0.85  CALCIUM 9.2 9.2 9.1 9.4 9.1   GFR Estimated Creatinine Clearance: 54.4 mL/min (by C-G formula based on SCr of 0.85 mg/dL). Liver Function Tests:  Recent Labs Lab 10/30/15 1314  AST 26  ALT 17  ALKPHOS 101  BILITOT 0.7  PROT 6.8  ALBUMIN 3.5    Recent Labs Lab 10/30/15 1314  LIPASE 28    CBC:  Recent Labs Lab 10/30/15 1314 10/31/15 0743 11/01/15 0524  WBC 8.9 8.7 7.4  HGB 10.6* 10.2* 10.3*  HCT 34.8* 32.9* 33.8*  MCV 87.4 86.1 87.6  PLT 229 200 210   CBG: Microbiology Recent Results (from the past 240 hour(s))  Culture, blood (Routine X 2) w Reflex to ID Panel     Status: None (Preliminary result)   Collection Time: 10/30/15 10:04 PM  Result Value Ref Range Status   Specimen  Description BLOOD RIGHT ARM  Final   Special Requests IN PEDIATRIC BOTTLE 3ML  Final   Culture NO GROWTH 4 DAYS  Final   Report Status PENDING  Incomplete  Culture, blood (Routine X 2) w Reflex to ID Panel     Status: None (Preliminary result)   Collection Time: 10/30/15 10:13 PM  Result Value Ref Range Status   Specimen Description BLOOD LEFT HAND  Final   Special Requests IN PEDIATRIC BOTTLE 1.5ML  Final   Culture NO GROWTH 4 DAYS  Final   Report Status PENDING  Incomplete     Discharge Instructions:   Discharge Instructions    Call MD for:  extreme fatigue    Complete by:  As directed   Call MD for:  persistant nausea and vomiting    Complete by:  As directed   Call MD for:  severe uncontrolled pain    Complete by:  As directed   Diet - low sodium heart healthy    Complete by:  As directed   Increase activity slowly    Complete by:  As directed       Medication List    STOP taking these medications   cephALEXin 500 MG capsule Commonly known as:  KEFLEX   HYDROcodone-acetaminophen 5-325 MG tablet Commonly known as:  NORCO/VICODIN   metoprolol tartrate 25 MG tablet Commonly known as:  LOPRESSOR   triamterene-hydrochlorothiazide 37.5-25 MG tablet Commonly known as:  MAXZIDE-25   valsartan-hydrochlorothiazide 320-25 MG tablet Commonly known as:  DIOVAN-HCT     TAKE these medications   amLODipine 10 MG tablet Commonly known as:  NORVASC Take 1 tablet (10 mg total) by mouth daily.   aspirin 81 MG tablet Take 81 mg by mouth daily.   butalbital-acetaminophen-caffeine 50-325-40 MG tablet Commonly known as:  FIORICET, ESGIC Take 1 tablet by mouth every 6 (six) hours as needed for headache.   CALCIUM-VITAMIN D PO Take 1 tablet by mouth every evening.   doxycycline 100 MG capsule Commonly known as:  VIBRAMYCIN Take 100 mg by mouth 2 (two) times daily.   irbesartan 300 MG tablet Commonly known as:  AVAPRO Take 1 tablet (300 mg total) by mouth daily.     pantoprazole 40 MG tablet Commonly known as:  PROTONIX Take 1 tablet (40 mg total) by mouth daily.   simvastatin 20 MG tablet Commonly known as:  ZOCOR Take 1 tablet (20 mg total) by mouth at bedtime.   sucralfate 1 GM/10ML suspension Commonly known as:  CARAFATE Take 10 mLs (1 g total) by mouth 4 (four) times daily -  with meals and at bedtime.      Follow-up Information    KOIRALA,DIBAS, MD. Schedule an appointment as soon as possible for a visit in 2 week(s).   Specialty:  Family Medicine Contact information: [place] [address] [phone]        Landry Dyke, MD .   Specialty:  Gastroenterology Why:  Call for an appt to schedule an endoscopic ultrasound. Contact information: [address] [place] [address] [phone]            Time  coordinating discharge: > 35 minutes.  Signed:  Jarquis Walker  Pager [phone] [facility] 11/03/2015, 4:39 PM

## 2015-11-03 NOTE — Discharge Instructions (Signed)

## 2015-11-04 LAB — CULTURE, BLOOD (ROUTINE X 2)
CULTURE: NO GROWTH
CULTURE: NO GROWTH

## 2015-11-19 ENCOUNTER — Emergency Department (HOSPITAL_COMMUNITY): Payer: Medicare Other

## 2015-11-19 ENCOUNTER — Observation Stay (HOSPITAL_COMMUNITY)
Admission: EM | Admit: 2015-11-19 | Discharge: 2015-11-21 | Disposition: A | Discharge status: Home / Self Care | Payer: Medicare Other | Attending: Family Medicine | Admitting: Family Medicine

## 2015-11-19 ENCOUNTER — Encounter (HOSPITAL_COMMUNITY): Payer: Self-pay

## 2015-11-19 DIAGNOSIS — W11XXXA Fall on and from ladder, initial encounter: Secondary | ICD-10-CM | POA: Insufficient documentation

## 2015-11-19 DIAGNOSIS — I10 Essential (primary) hypertension: Secondary | ICD-10-CM | POA: Diagnosis not present

## 2015-11-19 DIAGNOSIS — Z8249 Family history of ischemic heart disease and other diseases of the circulatory system: Secondary | ICD-10-CM | POA: Insufficient documentation

## 2015-11-19 DIAGNOSIS — R0789 Other chest pain: Principal | ICD-10-CM | POA: Insufficient documentation

## 2015-11-19 DIAGNOSIS — Z7982 Long term (current) use of aspirin: Secondary | ICD-10-CM | POA: Insufficient documentation

## 2015-11-19 DIAGNOSIS — D649 Anemia, unspecified: Secondary | ICD-10-CM | POA: Insufficient documentation

## 2015-11-19 DIAGNOSIS — R079 Chest pain, unspecified: Secondary | ICD-10-CM | POA: Diagnosis not present

## 2015-11-19 DIAGNOSIS — K219 Gastro-esophageal reflux disease without esophagitis: Secondary | ICD-10-CM | POA: Insufficient documentation

## 2015-11-19 DIAGNOSIS — E785 Hyperlipidemia, unspecified: Secondary | ICD-10-CM | POA: Insufficient documentation

## 2015-11-19 LAB — BASIC METABOLIC PANEL
Anion gap: 9 (ref 5–15)
BUN: 25 mg/dL — ABNORMAL HIGH (ref 6–20)
CHLORIDE: 106 mmol/L (ref 101–111)
CO2: 25 mmol/L (ref 22–32)
CREATININE: 1.02 mg/dL — AB (ref 0.44–1.00)
Calcium: 9 mg/dL (ref 8.9–10.3)
GFR calc non Af Amer: 53 mL/min — ABNORMAL LOW (ref >60)
GLUCOSE: 123 mg/dL — AB (ref 65–99)
Potassium: 3.9 mmol/L (ref 3.5–5.1)
Sodium: 140 mmol/L (ref 135–145)

## 2015-11-19 LAB — I-STAT TROPONIN, ED: Troponin i, poc: 0.01 ng/mL (ref 0.00–0.08)

## 2015-11-19 LAB — CBC
HCT: 35.5 % — ABNORMAL LOW (ref 36.0–46.0)
HEMOGLOBIN: 10.9 g/dL — AB (ref 12.0–15.0)
MCH: 27.3 pg (ref 26.0–34.0)
MCHC: 30.7 g/dL (ref 30.0–36.0)
MCV: 89 fL (ref 78.0–100.0)
PLATELETS: 224 10*3/uL (ref 150–400)
RBC: 3.99 MIL/uL (ref 3.87–5.11)
RDW: 15 % (ref 11.5–15.5)
WBC: 8.7 10*3/uL (ref 4.0–10.5)

## 2015-11-19 LAB — HEPATIC FUNCTION PANEL
ALBUMIN: 3.7 g/dL (ref 3.5–5.0)
ALK PHOS: 107 U/L (ref 38–126)
ALT: 17 U/L (ref 14–54)
AST: 21 U/L (ref 15–41)
BILIRUBIN TOTAL: 0.5 mg/dL (ref 0.3–1.2)
Bilirubin, Direct: <0.1 mg/dL — ABNORMAL LOW (ref 0.1–0.5)
Total Protein: 6.9 g/dL (ref 6.5–8.1)

## 2015-11-19 LAB — LIPASE, BLOOD: Lipase: 43 U/L (ref 11–51)

## 2015-11-19 MED ORDER — ACETAMINOPHEN 325 MG PO TABS
650.0000 mg | ORAL_TABLET | ORAL | Status: DC | PRN
  Administered 2015-11-20 – 2015-11-21 (×2): 650 mg via ORAL
  Filled 2015-11-19 (×2): qty 2

## 2015-11-19 MED ORDER — GI COCKTAIL ~~LOC~~
30.0000 mL | Freq: Four times a day (QID) | ORAL | Status: DC | PRN
  Administered 2015-11-20: 30 mL via ORAL
  Filled 2015-11-19: qty 30

## 2015-11-19 NOTE — H&P (Signed)
History and Physical  Patient Name: Mandy Ortiz     [id number]    DOB: 11/26/1940    DOA: 11/19/2015 PCP: Lujean Amel, MD   Patient coming from: Home --> GI clinic --> Urgent Care --> ER     Chief Complaint: Chest pain  HPI: Mandy Ortiz is a 75 y.o. female with a past medical history significant for HTN who presents with chest pain.  The patient was just recently in the hospital for 4 days for diarrhea in the setting of treating her leg cellulitis. After getting out of the hospital, she was fine for a few days, but then started to notice some intermittent chest pain. She first noticed this one night while lying in the bed watching TV. The pain is left-sided, radiates to her back, lasts one to one and a half hours at a time, is knifelike, and associated with nausea and clamminess. It is not particularly exertional.  This week she went to Dr. Erlinda Hong office, and commented about the chest pain to him, and he referred her to her PCP. She called her PCP today who recommended she go to urgent care. At urgent care, she had an ECG that showed new concave up ST depressions in inferior leads, and was recommended to go to the emergency room and nitroglycerin administered in the urgent care did not improve her symptoms.  ED course: -Afebrile, heart rate 50s, blood pressure 185/95, pulse oximetry normal -Initial ECG showed no ST depressions and troponin was negative. -Na 140, K 3.9, Cr 1.02, WBC 8.7, Hgb 10.9 -Chest x-ray clear -TRH was asked to admit for observation, serial troponins and risk stratification.  The patient was recently admitted for diarrhea in the context of treatment for leg cellulitis, concern for C diff.  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 She was evaluated by GI, and the plan for the former was serial guaiacs off aspirin as an outpatient, and the plan for the latter was consultation with  Dr. wall for consideration of endoscopic ultrasound (and I assume biopsy of the cystic mass).     Review of Systems:  All other systems negative except as just noted or noted in the history of present illness.  Past Medical History:  Diagnosis Date  . GERD (gastroesophageal reflux disease)   . Hiatal hernia   . Hyperlipidemia   . Hypertension   . Migraine     Past Surgical History:  Procedure Laterality Date  . ABDOMINAL HYSTERECTOMY    . ABDOMINAL SURGERY    . COLON SURGERY      Social History: Patient lives alone.  Patient walks unassisted.  She is not a smoker.    Allergies  Allergen Reactions  . Sulfa Antibiotics Hives and Swelling  . Sulfamethoxazole Hives and Swelling    Family history: family history includes Heart attack in her brother; Hypertension in her brother, father, and mother.  Prior to Admission medications   Medication Sig Start Date End Date Taking? Authorizing Provider  amLODipine (NORVASC) 10 MG tablet Take 1 tablet (10 mg total) by mouth daily. 05/30/14  Yes Shanker Kristeen Mans, MD  aspirin 81 MG tablet Take 81 mg by mouth every morning.    Yes Historical Provider, MD  butalbital-acetaminophen-caffeine (FIORICET, ESGIC) 50-325-40 MG tablet Take 1 tablet by mouth every 6 (six) hours as needed for headache. 11/03/15  Yes Venetia Maxon Rama, MD  CALCIUM-VITAMIN D PO Take 1 tablet by mouth every evening.  Yes Historical Provider, MD  doxycycline (VIBRAMYCIN) 100 MG capsule Take 100 mg by mouth 2 (two) times daily.   Yes Historical Provider, MD  irbesartan (AVAPRO) 300 MG tablet Take 1 tablet (300 mg total) by mouth daily. 05/30/14  Yes Shanker Kristeen Mans, MD  pantoprazole (PROTONIX) 40 MG tablet Take 1 tablet (40 mg total) by mouth daily. 05/30/14  Yes Shanker Kristeen Mans, MD  simvastatin (ZOCOR) 20 MG tablet Take 1 tablet (20 mg total) by mouth at bedtime. 05/30/14  Yes Shanker Kristeen Mans, MD  triamterene-hydrochlorothiazide (DYAZIDE) 50-25 MG capsule Take 1 capsule by  mouth daily. 11/16/15  Yes Historical Provider, MD       Physical Exam: BP 162/92   Pulse 70   Temp 98.1 F (36.7 C) (Oral)   Resp 20   Ht 5\' 6"  (1.676 m)   Wt 67.6 kg (149 lb)   SpO2 100%   BMI 24.05 kg/m  General appearance: Well-developed, adult female, alert and in no acute distress.   Eyes: Anicteric, conjunctiva pink, lids and lashes normal.     ENT: No nasal deformity, discharge, or epistaxis.  OP moist without lesions.   Skin: Warm and dry.   Cardiac: RRR, nl S1-S2, no murmurs appreciated.  Capillary refill is brisk.  JVP normal.  No LE edema.  Radial and DP pulses 2+ and symmetric.  No carotid bruits. Respiratory: Normal respiratory rate and rhythm.  CTAB without rales or wheezes. GI: Abdomen soft without rigidity.  No TTP. No ascites, distension.   MSK: No deformities or effusions.   Pain not reproduced with palpation of precordium.  No pain with arm movement. Neuro: Sensorium intact and responding to questions, attention normal.  Speech is fluent.  Moves all extremities equally and with normal coordination.    Psych: Behavior appropriate.  Affect normal.  No evidence of aural or visual hallucinations or delusions.       Labs on Admission:  The metabolic panel shows elevated BUN to creatinine ratio, normal renal function, normal electrolytes. The complete blood count shows a stable normocytic anemia. The initial troponin is negative.  Radiological Exams on Admission: Personally reviewed x-ray shows no focal opacity: Dg Chest 2 View  Result Date: 11/19/2015 CLINICAL DATA:  Chest pain EXAM: CHEST  2 VIEW COMPARISON:  05/26/2014 FINDINGS: The heart size and mediastinal contours are within normal limits. Aortic atherosclerosis noted. Both lungs are clear. The visualized skeletal structures are unremarkable. IMPRESSION: No active cardiopulmonary disease. Electronically Signed   By: Kerby Moors M.D.   On: 11/19/2015 18:30    EKG: Independently reviewed. ECG from urgent  care is brought with the patient and shows 1 mm of ST depression in inferior leads. All previous ECGs have poor baseline and it is hard to rule out that this ST segment change is new (interestingly it is not present on the ECG redone here, which just shows a sinus bradycardia).    Echocardiogram 2016: EF 0000000 Grade II diastolic dysfunction Mild to moderate MR     Assessment/Plan 1. Chest pain: This is new.  The patient has HEART score of 4. Angina is very atypical.  Other potential causes of chest pain (PE, dissection, pancreatitis, pneumonia/effusion, pericarditis) are doubted, but chest pain from her elevated blood pressure is conceivable.  -Serial troponins are ordered -Telemetry -Consult to cardiology, appreciate recommendations -BP control   2. HTN:  -Continue amlodipine, irbesartan, triamterene-HCTZ -Continue simvastatin -Consult to Cardiology  3. Other medications:  -Continue PPI  4. Headache:  -May  take home Fiorcet PRN     DVT prophylaxis: Lovenox Diet: NPO after 4am for anticipated stress testing Code Status: Full  Family Communication: None present  Disposition Plan: Anticipate overnight observation for arrhythmia on telemetry, serial troponins and subsequent risk stratification by Cardiology.  If testing negative, home after. Consults called: Cardiology Admission status: Telemetry, OBS   Medical decision making: Patient seen at 8:45 PM on 11/19/2015.  The patient was discussed with Dr. Alvino Chapel. What exists of the patient's chart was reviewed in depth.  Clinical condition: stable.      Edwin Dada [facility] Pager [phone]

## 2015-11-19 NOTE — ED Notes (Signed)
Dr Loleta Books in room

## 2015-11-19 NOTE — ED Notes (Signed)
Dr Alvino Chapel in room

## 2015-11-19 NOTE — ED Triage Notes (Signed)
Per  EMS, pt from PCP Dr Vira Blanco Office. Pt began having left sided CP at 0500 with radiation to the back. Pt went to see Dr Henreitta Cea and pt's EKG revealed depression in leads 2 and 3 that was different from previous EKG's. Same with EMS. Pt alert and oriented x4. Pt was given 1 nitro at pcp and CP subsided. Pt now has 8/10 pain. Pt alert and oriented x 4. Breath sounds clear. VSS.

## 2015-11-19 NOTE — ED Provider Notes (Signed)
[facility] DEPT Provider Note   CSN: [place]:[id number] Arrival date & time: 11/19/15  1713     History   Chief Complaint Chief Complaint  Patient presents with  . Chest Pain    HPI Mandy Ortiz is a 75 y.o. female.  The history is provided by the patient.  Chest Pain   This is a new problem. Associated symptoms include diaphoresis and nausea. Pertinent negatives include no abdominal pain, no back pain, no numbness and no shortness of breath.  Patient's had episodes of chest pain going to the back over the last 2 weeks. States began while she was in the hospital. States is because her blood pressure was high. All it up with her primary care doctor date was sent in for EKG changes. States she has episodes for the pain comes on. Somewhat comes and goes. Does not come on with exertion. Does not come on after eating. Pain is dull. She does have nausea and some sweatiness with it. No shortness of breath. No fevers. No cough.Note from Dr. states the pain felt better after nitroglycerin, however patient states it does not feel any better.  Past Medical History:  Diagnosis Date  . GERD (gastroesophageal reflux disease)   . Hiatal hernia   . Hyperlipidemia   . Hypertension   . Migraine     Patient Active Problem List   Diagnosis Date Noted  . AKI (acute kidney injury) ([place]) 11/01/2015  . Positive fecal occult blood test 11/01/2015  . Nodule of left lung 10/31/2015  . Liver lesions, multiple 10/31/2015  . Pancreatic lesion 10/31/2015  . History of Bell's palsy 10/31/2015  . Traumatic hematoma of lower leg with infection 10/30/2015  . Abdominal pain 10/30/2015  . Diarrhea 10/30/2015  . Essential hypertension 10/30/2015  . GERD (gastroesophageal reflux disease) 10/30/2015  . Hypertensive urgency 05/27/2014  . Chest pain 05/27/2014  . Headache 05/27/2014  . Hyperlipidemia 05/27/2014  . Migraine without aura and with status migrainosus, not intractable     Past Surgical History:    Procedure Laterality Date  . ABDOMINAL HYSTERECTOMY    . ABDOMINAL SURGERY    . COLON SURGERY      OB History    No data available       Home Medications    Prior to Admission medications   Medication Sig Start Date End Date Taking? Authorizing Provider  amLODipine (NORVASC) 10 MG tablet Take 1 tablet (10 mg total) by mouth daily. 05/30/14  Yes Shanker Kristeen Mans, MD  aspirin 81 MG tablet Take 81 mg by mouth every morning.    Yes Historical Provider, MD  butalbital-acetaminophen-caffeine (FIORICET, ESGIC) 50-325-40 MG tablet Take 1 tablet by mouth every 6 (six) hours as needed for headache. 11/03/15  Yes Venetia Maxon Rama, MD  CALCIUM-VITAMIN D PO Take 1 tablet by mouth every evening.   Yes Historical Provider, MD  doxycycline (VIBRAMYCIN) 100 MG capsule Take 100 mg by mouth 2 (two) times daily.   Yes Historical Provider, MD  irbesartan (AVAPRO) 300 MG tablet Take 1 tablet (300 mg total) by mouth daily. 05/30/14  Yes Shanker Kristeen Mans, MD  pantoprazole (PROTONIX) 40 MG tablet Take 1 tablet (40 mg total) by mouth daily. 05/30/14  Yes Shanker Kristeen Mans, MD  simvastatin (ZOCOR) 20 MG tablet Take 1 tablet (20 mg total) by mouth at bedtime. 05/30/14  Yes Shanker Kristeen Mans, MD  triamterene-hydrochlorothiazide (DYAZIDE) 50-25 MG capsule Take 1 capsule by mouth daily. 11/16/15  Yes Historical Provider, MD  Family History Family History  Problem Relation Age of Onset  . Hypertension Mother   . Hypertension Father   . Hypertension Brother     Social History Social History  Substance Use Topics  . Smoking status: Never Smoker  . Smokeless tobacco: Never Used  . Alcohol use No     Allergies   Sulfa antibiotics and Sulfamethoxazole   Review of Systems Review of Systems  Constitutional: Positive for diaphoresis. Negative for appetite change.  Respiratory: Negative for shortness of breath.   Cardiovascular: Positive for chest pain.  Gastrointestinal: Positive for nausea. Negative for  abdominal pain.  Genitourinary: Negative for dyspareunia.  Musculoskeletal: Negative for back pain.  Neurological: Negative for numbness.  Hematological: Negative for adenopathy.  Psychiatric/Behavioral: Negative for confusion.     Physical Exam Updated Vital Signs BP 162/92   Pulse 70   Temp 98.1 F (36.7 C) (Oral)   Resp 20   Ht 5\' 6"  (1.676 m)   Wt 149 lb (67.6 kg)   SpO2 100%   BMI 24.05 kg/m   Physical Exam  Constitutional: She is oriented to person, place, and time. She appears well-developed and well-nourished.  HENT:  Head: Normocephalic and atraumatic.  Eyes: EOM are normal.  Neck: Normal range of motion. Neck supple. No JVD present.  Cardiovascular: Normal rate and regular rhythm.   Pulmonary/Chest: Effort normal and breath sounds normal. No respiratory distress. She has no wheezes. She has no rales.  Abdominal: Soft. Bowel sounds are normal. She exhibits no distension. There is no tenderness. There is no rebound and no guarding.  Musculoskeletal: Normal range of motion. She exhibits no edema.  Neurological: She is alert and oriented to person, place, and time.  Skin: Skin is warm and dry.  Psychiatric: She has a normal mood and affect. Her speech is normal.  Nursing note and vitals reviewed.    ED Treatments / Results  Labs (all labs ordered are listed, but only abnormal results are displayed) Labs Reviewed  BASIC METABOLIC PANEL - Abnormal; Notable for the following:       Result Value   Glucose, Bld 123 (*)    BUN 25 (*)    Creatinine, Ser 1.02 (*)    GFR calc non Af Amer 53 (*)    All other components within normal limits  CBC - Abnormal; Notable for the following:    Hemoglobin 10.9 (*)    HCT 35.5 (*)    All other components within normal limits  HEPATIC FUNCTION PANEL  LIPASE, BLOOD  I-STAT TROPOININ, ED    EKG  EKG Interpretation  Date/Time:  Thursday November 19 2015 17:31:42 EDT Ventricular Rate:  54 PR Interval:    QRS  Duration: 95 QT Interval:  442 QTC Calculation: 419 R Axis:   68 Text Interpretation:  Sinus rhythm Left ventricular hypertrophy Confirmed by Alvino Chapel  MD, Ovid Curd [phone]) on 11/19/2015 5:35:36 PM       Radiology Dg Chest 2 View  Result Date: 11/19/2015 CLINICAL DATA:  Chest pain EXAM: CHEST  2 VIEW COMPARISON:  05/26/2014 FINDINGS: The heart size and mediastinal contours are within normal limits. Aortic atherosclerosis noted. Both lungs are clear. The visualized skeletal structures are unremarkable. IMPRESSION: No active cardiopulmonary disease. Electronically Signed   By: Kerby Moors M.D.   On: 11/19/2015 18:30    Procedures Procedures (including critical care time)  Medications Ordered in ED Medications - No data to display   Initial Impression / Assessment and Plan / ED Course  I have reviewed the triage vital signs and the nursing notes.  Pertinent labs & imaging results that were available during my care of the patient were reviewed by me and considered in my medical decision making (see chart for details).  Clinical Course    Patient with chest pain. Dull in her chest. Story is changed a little bit sometimes she states it is different than the abdominal pain she's been having other time she said it was the same pain.primary care note states that her EKG is changes however this appears stable to me. Patient states the nitroglycerin did not help the pain. To be seen by internal medicine.  Fi nal Clinical Impressions(s) / ED Diagnoses   Final diagnoses:  Chest pain, unspecified chest pain type    New Prescriptions New Prescriptions   No medications on file     Davonna Belling, MD 11/19/15 2042

## 2015-11-19 NOTE — ED Notes (Signed)
Pt returned from X-ray.  

## 2015-11-19 NOTE — ED Notes (Signed)
Patient transported to X-ray 

## 2015-11-20 ENCOUNTER — Observation Stay (HOSPITAL_COMMUNITY): Payer: Medicare Other

## 2015-11-20 ENCOUNTER — Observation Stay (HOSPITAL_BASED_OUTPATIENT_CLINIC_OR_DEPARTMENT_OTHER): Payer: Medicare Other

## 2015-11-20 DIAGNOSIS — R079 Chest pain, unspecified: Secondary | ICD-10-CM

## 2015-11-20 DIAGNOSIS — E785 Hyperlipidemia, unspecified: Secondary | ICD-10-CM | POA: Diagnosis not present

## 2015-11-20 DIAGNOSIS — I10 Essential (primary) hypertension: Secondary | ICD-10-CM

## 2015-11-20 LAB — NM MYOCAR MULTI W/SPECT W/WALL MOTION / EF
CHL CUP RESTING HR STRESS: 50 {beats}/min
LVDIAVOL: 82 mL (ref 46–106)
LVSYSVOL: 33 mL
NUC STRESS TID: 1
Peak HR: 97 {beats}/min
RATE: 0.28
SDS: 4
SRS: 2
SSS: 6

## 2015-11-20 LAB — TROPONIN I
Troponin I: <0.03 ng/mL (ref <0.03)
Troponin I: <0.03 ng/mL (ref <0.03)

## 2015-11-20 MED ORDER — TECHNETIUM TC 99M TETROFOSMIN IV KIT
10.0000 | PACK | Freq: Once | INTRAVENOUS | Status: AC | PRN
Start: 2015-11-20 — End: 2015-11-20
  Administered 2015-11-20: 10 via INTRAVENOUS

## 2015-11-20 MED ORDER — ENOXAPARIN SODIUM 40 MG/0.4ML ~~LOC~~ SOLN
40.0000 mg | Freq: Every day | SUBCUTANEOUS | Status: DC
  Administered 2015-11-20 – 2015-11-21 (×2): 40 mg via SUBCUTANEOUS
  Filled 2015-11-20 (×2): qty 0.4

## 2015-11-20 MED ORDER — PANTOPRAZOLE SODIUM 40 MG PO TBEC
40.0000 mg | DELAYED_RELEASE_TABLET | Freq: Every day | ORAL | Status: DC
  Administered 2015-11-20 – 2015-11-21 (×2): 40 mg via ORAL
  Filled 2015-11-20 (×2): qty 1

## 2015-11-20 MED ORDER — SIMVASTATIN 20 MG PO TABS
20.0000 mg | ORAL_TABLET | Freq: Every day | ORAL | Status: DC
  Administered 2015-11-20 (×2): 20 mg via ORAL
  Filled 2015-11-20 (×2): qty 1

## 2015-11-20 MED ORDER — TECHNETIUM TC 99M TETROFOSMIN IV KIT
30.0000 | PACK | Freq: Once | INTRAVENOUS | Status: AC | PRN
  Administered 2015-11-20: 30 via INTRAVENOUS

## 2015-11-20 MED ORDER — TRIAMTERENE-HCTZ 37.5-25 MG PO TABS
1.0000 | ORAL_TABLET | Freq: Every day | ORAL | Status: DC
Start: 2015-11-20 — End: 2015-11-21
  Administered 2015-11-20 – 2015-11-21 (×2): 1 via ORAL
  Filled 2015-11-20 (×2): qty 1

## 2015-11-20 MED ORDER — REGADENOSON 0.4 MG/5ML IV SOLN
INTRAVENOUS | Status: AC
  Filled 2015-11-20: qty 5

## 2015-11-20 MED ORDER — REGADENOSON 0.4 MG/5ML IV SOLN
0.4000 mg | Freq: Once | INTRAVENOUS | Status: DC
  Filled 2015-11-20: qty 5

## 2015-11-20 MED ORDER — ASPIRIN EC 81 MG PO TBEC
81.0000 mg | DELAYED_RELEASE_TABLET | Freq: Every day | ORAL | Status: DC
  Administered 2015-11-20 – 2015-11-21 (×2): 81 mg via ORAL
  Filled 2015-11-20 (×2): qty 1

## 2015-11-20 MED ORDER — IRBESARTAN 300 MG PO TABS
300.0000 mg | ORAL_TABLET | Freq: Every day | ORAL | Status: DC
  Administered 2015-11-20 – 2015-11-21 (×2): 300 mg via ORAL
  Filled 2015-11-20 (×2): qty 1

## 2015-11-20 MED ORDER — AMLODIPINE BESYLATE 10 MG PO TABS
10.0000 mg | ORAL_TABLET | Freq: Every day | ORAL | Status: DC
  Administered 2015-11-20 – 2015-11-21 (×2): 10 mg via ORAL
  Filled 2015-11-20 (×2): qty 1

## 2015-11-20 MED ORDER — ONDANSETRON HCL 4 MG/2ML IJ SOLN: 4.0000 mg | Freq: Four times a day (QID) | INTRAMUSCULAR | Status: DC | PRN

## 2015-11-20 MED ORDER — BUTALBITAL-APAP-CAFFEINE 50-325-40 MG PO TABS
1.0000 | ORAL_TABLET | Freq: Four times a day (QID) | ORAL | Status: DC | PRN
  Administered 2015-11-20 (×2): 1 via ORAL
  Filled 2015-11-20 (×2): qty 1

## 2015-11-20 NOTE — Consult Note (Signed)
Cardiology Consult    Patient ID: Mandy Ortiz MRN: [id number], DOB/AGE: Apr 18, 1940   Admit date: 11/19/2015 Date of Consult: 11/20/2015  Primary Physician: Lujean Amel, MD Reason for Consult: Chest pain Primary Cardiologist: New  Requesting Provider: Dr. Wendee Beavers  Patient Profile    Mandy Ortiz is a 75 year old female with a past medical history of HTN and HLD. She presented to the ED on 11/19/15 with left sided chest pain.   History of Present Illness  Mandy Ortiz tells me that she was lying in her bed at home yesterday and around 5 am she was awakened with chest pain. She thought she should get up and move around to see if this made the pain better, so she walked into the kitchen to get water. The chest pain became so intense that she doubled over, she felt dizzy, SOB and diaphoretic. She describes her pain as sharp in nature with radiation to her back, particularly in the interscapular region.   She then went to see her PCP who did an EKG. It is noted in the chart that the EKG in the office had ST depression in leads II and III. She was given a sublingual nitro and her pain subsided, she was transported to the ED.   Mandy Ortiz has had intermittent chest pain for the past 3 months, usually occurring at rest. The episode of chest pain yesterday morning was the most intense that the pain has been.  EKG in the ED showed NSR with no acute ST/T wave changes. There was evidence of LVH, last Echo in March of 2016 that showed grade 2 diastolic dysfunction.   Her troponin is negative x 3. She denies any history of smoking or ETOH abuse. She had a twin brother who died from MI in his 26's.     Past Medical History   Past Medical History:  Diagnosis Date  . GERD (gastroesophageal reflux disease)   . Hiatal hernia   . Hyperlipidemia   . Hypertension   . Migraine     Past Surgical History:  Procedure Laterality Date  . ABDOMINAL HYSTERECTOMY    . ABDOMINAL SURGERY    . COLON  SURGERY       Allergies  Allergies  Allergen Reactions  . Sulfa Antibiotics Hives and Swelling  . Sulfamethoxazole Hives and Swelling    Inpatient Medications    . amLODipine  10 mg Oral Daily  . aspirin EC  81 mg Oral Daily  . enoxaparin (LOVENOX) injection  40 mg Subcutaneous Daily  . irbesartan  300 mg Oral Daily  . pantoprazole  40 mg Oral Daily  . simvastatin  20 mg Oral QHS  . triamterene-hydrochlorothiazide  1 tablet Oral Daily    Family History    Family History  Problem Relation Age of Onset  . Hypertension Mother   . Hypertension Father   . Hypertension Brother   . Heart attack Brother     Social History    Social History   Social History  . Marital status: Widowed    Spouse name: N/A  . Number of children: N/A  . Years of education: N/A   Occupational History  . Not on file.   Social History Main Topics  . Smoking status: Never Smoker  . Smokeless tobacco: Never Used  . Alcohol use No  . Drug use: No  . Sexual activity: Not on file   Other Topics Concern  . Not on file   Social History Narrative  .  No narrative on file     Review of Systems    General:  No chills, fever, night sweats or weight changes.  Cardiovascular: + chest pain, dyspnea on exertion, edema, orthopnea, palpitations, paroxysmal nocturnal dyspnea. Dermatological: No rash, lesions/masses Respiratory: No cough, dyspnea Urologic: No hematuria, dysuria Abdominal:   No nausea, vomiting, diarrhea, bright red blood per rectum, melena, or hematemesis Neurologic:  No visual changes, wkns, changes in mental status. All other systems reviewed and are otherwise negative except as noted above.  Physical Exam    Blood pressure 139/64, pulse (!) 55, temperature 98.5 F (36.9 C), temperature source Oral, resp. rate 18, height 5\' 6"  (1.676 m), weight 144 lb (65.3 kg), SpO2 99 %.  General: Pleasant, NAD Psych: Normal affect. Neuro: Alert and oriented X 3. Moves all extremities  spontaneously. HEENT: Normal  Neck: Supple without bruits or JVD. Lungs:  Resp regular and unlabored, CTA. Heart: RRR no s3, s4, or murmurs. Abdomen: Soft, non-tender, non-distended, BS + x 4.  Extremities: No clubbing, cyanosis or edema. DP/PT/Radials 2+ and equal bilaterally.  Labs    Troponin [facility] Test)  Recent Labs  11/19/15 1753  TROPIPOC 0.01    Recent Labs  11/20/15 0106 11/20/15 0343 11/20/15 0652  TROPONINI <0.03 <0.03 <0.03   Lab Results  Component Value Date   WBC 8.7 11/19/2015   HGB 10.9 (L) 11/19/2015   HCT 35.5 (L) 11/19/2015   MCV 89.0 11/19/2015   PLT 224 11/19/2015    Recent Labs Lab 11/19/15 1723 11/19/15 2016  NA 140  --   K 3.9  --   CL 106  --   CO2 25  --   BUN 25*  --   CREATININE 1.02*  --   CALCIUM 9.0  --   PROT  --  6.9  BILITOT  --  0.5  ALKPHOS  --  107  ALT  --  17  AST  --  21  GLUCOSE 123*  --      Radiology Studies    Dg Chest 2 View  Result Date: 11/19/2015 CLINICAL DATA:  Chest pain EXAM: CHEST  2 VIEW COMPARISON:  05/26/2014 FINDINGS: The heart size and mediastinal contours are within normal limits. Aortic atherosclerosis noted. Both lungs are clear. The visualized skeletal structures are unremarkable. IMPRESSION: No active cardiopulmonary disease. Electronically Signed   By: Kerby Moors M.D.   On: 11/19/2015 18:30   Dg Tibia/fibula Left  Result Date: 10/30/2015 CLINICAL DATA:  75 year old female with injury to left comment. Midshaft left leg wound. EXAM: LEFT TIBIA AND FIBULA - 2 VIEW COMPARISON:  Radiograph dated 10/15/2015 FINDINGS: There is no acute fracture or dislocation. The bones are mildly osteopenic. No significant arthritic changes. The soft tissues appear unremarkable. No radiopaque foreign object or soft tissue gas identified. IMPRESSION: No acute findings. Electronically Signed   By: Anner Crete M.D.   On: 10/30/2015 18:47   Ct Abdomen Pelvis W Contrast  Result Date: 10/31/2015 CLINICAL  DATA:  Subacute onset of generalized abdominal pain. Initial encounter. EXAM: CT ABDOMEN AND PELVIS WITH CONTRAST TECHNIQUE: Multidetector CT imaging of the abdomen and pelvis was performed using the standard protocol following bolus administration of intravenous contrast. CONTRAST:  172mL ISOVUE-300 IOPAMIDOL (ISOVUE-300) INJECTION 61% COMPARISON:  None. FINDINGS: Lower chest: Minimal bibasilar atelectasis is noted. The visualized portions of the mediastinum are unremarkable. Wall thickening is noted along the distal esophagus, concerning for esophagitis. Hepatobiliary: There is mild intrahepatic biliary ductal dilatation, with dilatation  of the common bile duct to 1.6 cm. The gallbladder is unremarkable in appearance. Pancreas: A 1.2 cm cystic structure is noted at the proximal body of the pancreas. The pancreas is otherwise grossly unremarkable. Spleen: The spleen is unremarkable in appearance. Adrenals/Urinary Tract: The adrenal glands are unremarkable in appearance. Small bilateral renal cysts are seen. The kidneys are otherwise unremarkable. There is no evidence of hydronephrosis. No renal or ureteral stones are identified. No perinephric stranding is seen. Stomach/Bowel: The stomach is unremarkable in appearance. The small bowel is grossly unremarkable, with postoperative change noted at the mid to distal ileum. The appendix is not visualized; there is no evidence for appendicitis. Minimal diverticulosis is noted along the sigmoid colon, without evidence of diverticulitis. Vascular/Lymphatic: Scattered calcification is seen along the abdominal aorta and its branches. The abdominal aorta is otherwise grossly unremarkable. The inferior vena cava is grossly unremarkable. No retroperitoneal lymphadenopathy is seen. No pelvic sidewall lymphadenopathy is identified. Reproductive: The bladder is moderately distended and grossly unremarkable. The patient is status post hysterectomy. No suspicious adnexal masses are  seen. Other: No additional soft tissue abnormalities are characterized. Musculoskeletal: No acute osseous abnormalities are identified. Vacuum phenomenon is noted at L5-S1. The visualized musculature is grossly unremarkable. IMPRESSION: 1. Wall thickening along the distal esophagus raises concern for esophagitis. 2. Mild intrahepatic biliary ductal dilatation, with dilatation of the common bile duct to 1.6 cm, concerning for distal obstruction. MRCP is recommended for further evaluation. 3. **An incidental finding of potential clinical significance has been found. 1.2 cm cystic structure at the proximal body of the pancreas. Would correlate with pancreatic lab values, and evaluate further on MRCP.** 4. Small bilateral renal cysts seen. 5. Minimal diverticulosis along the sigmoid colon, without evidence of diverticulitis. Electronically Signed   By: Garald Balding M.D.   On: 10/31/2015 03:32   Mr 3d Recon At Scanner  Result Date: 10/31/2015 CLINICAL DATA:  Intra and extrahepatic biliary duct dilatation on CT scan earlier today. 12 mm pancreatic cyst. EXAM: MRI ABDOMEN WITHOUT CONTRAST  (INCLUDING MRCP) TECHNIQUE: Multiplanar multisequence MR imaging of the abdomen was performed. Heavily T2-weighted images of the biliary and pancreatic ducts were obtained, and three-dimensional MRCP images were rendered by post processing. COMPARISON:  CT scan earlier today. Virtual colonoscopy study from 11/05/2012. FINDINGS: Lower chest: 8 mm posterior left lung nodule noted (see image 11 series 4). Hepatobiliary: Evaluation of the liver is degraded by patient breathing motion. Multiple hypervascular lesions are identified in the liver, scattered through both hepatic lobes and ranging in size from 6 mm up to a dominant lesion in the dome of the liver measuring 3 cm. This dominant lesion in the hepatic dome was very subtle, but perceptible on the study from 11/05/2012 when it measured 2.3 cm. Other subtle hypo attenuating lesions  scattered through the liver parenchyma were seen on the 2014 exam as well. Gallbladder is unremarkable. Extrahepatic common duct measures 9 mm in diameter today. Common bile duct in the head of the pancreas measures 7 mm which is upper normal for patient age. Pancreas: 12 mm cystic lesion in the head of the pancreas has homogeneous high signal intensity on T2 weighted imaging and and shows no evidence for enhancement after IV contrast administration. Relationship of this lesion to the main pancreatic duct cannot be evaluated secondary to motion artifact. There is no evidence for an enhancing mass in the pancreatic parenchyma. No dilatation of the main pancreatic duct. Spleen: No splenomegaly. No focal mass lesion. Adrenals/Urinary Tract: No  adrenal nodule or mass. Multiple simple cysts are noted in the kidneys bilaterally. There is no hydronephrosis. Stomach/Bowel: Stomach is unremarkable. Duodenum is normally positioned as is the ligament of Treitz. Visualize small bowel and colon of the abdomen is nondilated. Vascular/Lymphatic: No abdominal aortic aneurysm. There is no gastrohepatic or hepatoduodenal ligament lymphadenopathy. No intraperitoneal or retroperitoneal lymphadenopathy. Other: No intraperitoneal free fluid. Musculoskeletal: No abnormal marrow enhancement within the visualized bony anatomy. IMPRESSION: 1. Multiple hypervascular lesions are identified throughout the liver. The largest lesions today were barely perceptible on the noncontrast CT scan from 11/05/2012 and have progressed only mildly in the interval. As such, these likely represent benign disease but cannot be definitively characterized on today's study. Dedicated hepatic MRI with hepatocyte specific contrast agent (Eovist) may prove helpful to further evaluate. This exam should be performed after resolution of acute symptoms and could be performed on an outpatient basis when the patient would be best able to cooperate with positioning and  reproducible breath holding as motion artifact substantially degrades image quality on today's study. 2. 12 mm cystic lesion in the head of the pancreas has simple features and is likely benign. Repeat MRI without and with contrast in 12 months is recommended. This recommendation follows ACR consensus guidelines: Management of Incidental Pancreatic Cysts: A White Paper of the ACR Incidental Findings Committee. J Am Coll Radiol [id number]. 3. Mild prominence of the extrahepatic common duct with upper normal diameter of the common bile duct. No obstructing pancreatic head mass or evidence of choledocholithiasis. No cholelithiasis. 4. **An incidental finding of potential clinical significance has been found. 8 mm left lung nodule. Non-contrast chest CT at 6-12 months is recommended. If the nodule is stable at time of repeat CT, then future CT at 18-24 months (from today's scan) is considered optional for low-risk patients, but is recommended for high-risk patients. This recommendation follows the consensus statement: Guidelines for Management of Incidental Pulmonary Nodules Detected on CT Images:From the Fleischner Society 2017; published online before print (10.1148/radiol.IJ:2314499).** Electronically Signed   By: Misty Stanley M.D.   On: 10/31/2015 11:27   [place] Extrem Low Left Ltd  Result Date: 10/30/2015 CLINICAL DATA:  [place] from ladder 3 weeks ago. Assess for anterior tibia/fibula infection or hematoma. EXAM: ULTRASOUND LEFT LOWER EXTREMITY LIMITED TECHNIQUE: Ultrasound examination of the lower extremity soft tissues was performed in the area of clinical concern. COMPARISON:  LEFT tibia and fibula radiographs October 30, 2015 at 1812 hours FINDINGS: Heterogeneous, predominately hypoechoic 5.1 x 0.7 x 2.1 cm avascular fluid collection within pretibial soft tissues corresponding to area of redness and swelling. Fluid collection is approximate 4 mm deep to the skin surface. IMPRESSION: Avascular 5.1 x 0.7 x  2.1 cm pretibial fluid collection, in the setting of trauma this likely represents resolving hematoma. No definite abscess though, this could be confirmed with sampling as clinically indicated. Electronically Signed   By: Elon Alas M.D.   On: 10/30/2015 20:31   Mr Jeananne Rama W/wo Cm/mrcp  Result Date: 10/31/2015 CLINICAL DATA:  Intra and extrahepatic biliary duct dilatation on CT scan earlier today. 12 mm pancreatic cyst. EXAM: MRI ABDOMEN WITHOUT CONTRAST  (INCLUDING MRCP) TECHNIQUE: Multiplanar multisequence MR imaging of the abdomen was performed. Heavily T2-weighted images of the biliary and pancreatic ducts were obtained, and three-dimensional MRCP images were rendered by post processing. COMPARISON:  CT scan earlier today. Virtual colonoscopy study from 11/05/2012. FINDINGS: Lower chest: 8 mm posterior left lung nodule noted (see image 11 series 4). Hepatobiliary: Evaluation of the  liver is degraded by patient breathing motion. Multiple hypervascular lesions are identified in the liver, scattered through both hepatic lobes and ranging in size from 6 mm up to a dominant lesion in the dome of the liver measuring 3 cm. This dominant lesion in the hepatic dome was very subtle, but perceptible on the study from 11/05/2012 when it measured 2.3 cm. Other subtle hypo attenuating lesions scattered through the liver parenchyma were seen on the 2014 exam as well. Gallbladder is unremarkable. Extrahepatic common duct measures 9 mm in diameter today. Common bile duct in the head of the pancreas measures 7 mm which is upper normal for patient age. Pancreas: 12 mm cystic lesion in the head of the pancreas has homogeneous high signal intensity on T2 weighted imaging and and shows no evidence for enhancement after IV contrast administration. Relationship of this lesion to the main pancreatic duct cannot be evaluated secondary to motion artifact. There is no evidence for an enhancing mass in the pancreatic parenchyma. No  dilatation of the main pancreatic duct. Spleen: No splenomegaly. No focal mass lesion. Adrenals/Urinary Tract: No adrenal nodule or mass. Multiple simple cysts are noted in the kidneys bilaterally. There is no hydronephrosis. Stomach/Bowel: Stomach is unremarkable. Duodenum is normally positioned as is the ligament of Treitz. Visualize small bowel and colon of the abdomen is nondilated. Vascular/Lymphatic: No abdominal aortic aneurysm. There is no gastrohepatic or hepatoduodenal ligament lymphadenopathy. No intraperitoneal or retroperitoneal lymphadenopathy. Other: No intraperitoneal free fluid. Musculoskeletal: No abnormal marrow enhancement within the visualized bony anatomy. IMPRESSION: 1. Multiple hypervascular lesions are identified throughout the liver. The largest lesions today were barely perceptible on the noncontrast CT scan from 11/05/2012 and have progressed only mildly in the interval. As such, these likely represent benign disease but cannot be definitively characterized on today's study. Dedicated hepatic MRI with hepatocyte specific contrast agent (Eovist) may prove helpful to further evaluate. This exam should be performed after resolution of acute symptoms and could be performed on an outpatient basis when the patient would be best able to cooperate with positioning and reproducible breath holding as motion artifact substantially degrades image quality on today's study. 2. 12 mm cystic lesion in the head of the pancreas has simple features and is likely benign. Repeat MRI without and with contrast in 12 months is recommended. This recommendation follows ACR consensus guidelines: Management of Incidental Pancreatic Cysts: A White Paper of the ACR Incidental Findings Committee. J Am Coll Radiol Q4852182. 3. Mild prominence of the extrahepatic common duct with upper normal diameter of the common bile duct. No obstructing pancreatic head mass or evidence of choledocholithiasis. No  cholelithiasis. 4. **An incidental finding of potential clinical significance has been found. 8 mm left lung nodule. Non-contrast chest CT at 6-12 months is recommended. If the nodule is stable at time of repeat CT, then future CT at 18-24 months (from today's scan) is considered optional for low-risk patients, but is recommended for high-risk patients. This recommendation follows the consensus statement: Guidelines for Management of Incidental Pulmonary Nodules Detected on CT Images:From the Fleischner Society 2017; published online before print (10.1148/radiol.IJ:2314499).** Electronically Signed   By: Misty Stanley M.D.   On: 10/31/2015 11:27    EKG & Cardiac Imaging    EKG: NSR  Echocardiogram:   - Left ventricle: The cavity size was normal. There was mild concentric hypertrophy. Systolic function was normal. The estimated ejection fraction was in the range of 55% to 60%. Wall motion was normal; there were no regional wall  motion abnormalities. Features are consistent with a pseudonormal left ventricular filling pattern, with concomitant abnormal relaxation and increased filling pressure (grade 2 diastolic dysfunction). - Aortic valve: There was trivial regurgitation. - Mitral valve: There was mild to moderate regurgitation. - Left atrium: The atrium was severely dilated. - Atrial septum: A patent foramen ovale cannot be excluded. Recommend agitated saline contrast study. - Pulmonary arteries: PA peak pressure: 37 mm Hg (S).  Impressions:  - The right ventricular systolic pressure was increased consistent with mild pulmonary hypertension.   Assessment & Plan    1. Chest pain with moderate risk for cardiac etiology: Presents with chest pain with radiation to her back. She did have associated symptoms of SOB and diaphoresis. Also with family history of CAD. We will order Lexiscan Myoview to evaluate for ischemia.   2. HTN: well controlled on current regimen.   3.  HLD: Would get fasting lipid panel, may need statin.     Signed, Arbutus Leas, NP 11/20/2015, 11:02 AM Pager: [phone]   I have seen, examined and evaluated the patient this PM in consultation along with Jettie Booze, NP.  After reviewing all the available data and chart, we discussed the patients laboratory, study & physical findings as well as symptoms in detail. I agree with her findings, examination as well as impression recommendations as per our discussion.    75 year old woman with a history of hypertension and hyperlipidemia who presented with symptoms of chest pain was at rest, but not necessarily associated with exertion. She had prolonged episode of chest pain was ruled out for MI. Her symptoms have some atypical and some typical features for angina. With her age and risk factors, at least moderate risk for cardiac etiology.  - I agree with ischemic evaluation with Myoview stress test. Further recommendations following results   She is on amlodipine, irbesartan and Maxide and blood pressures are relatively stable. Some borderline heart rates and therefore would not recommend beta blocker now. Could consider switching to chlorthalidone from [facility].  Is on statin.   Glenetta Hew, M.D., M.S. Interventional Cardiologist   Pager # [phone] Phone # [phone] [address] [place], [place] [address]

## 2015-11-20 NOTE — Progress Notes (Signed)
Nuc was normal. Dr. Ellyn Hack reports he already sent primary team a message to inform them. I spoke with pts nurse to make them aware and to pass on to patient. No further cardiac w/u needed as inpt. Can f/u cardiology PRN.  Mandy Mosher PA-C

## 2015-11-20 NOTE — Progress Notes (Signed)
   Mandy Ortiz presented for a nuclear stress test today.  No immediate complications.  Stress imaging is pending at this time.  Preliminary EKG findings may be listed in the chart, but the stress test result will not be finalized until perfusion imaging is complete.  If nuc is abnormal may need to consider GI input given recent admission.  Charlie Pitter, PA-C 11/20/2015, 1:43 PM

## 2015-11-20 NOTE — Progress Notes (Signed)
PROGRESS NOTE    Mandy Ortiz  [id number] DOB: November 08, 1940 DOA: 11/19/2015 PCP: Lujean Amel, MD    Brief Narrative:  75 y/o with history of HTN presenting to the hospital complaining of chest pain. Currently in house undergoing chest pain rule out   Assessment & Plan:   Active Problems:   Chest pain - Cardiology consulted and considering further work up - Troponins negative - continue supportive therapy    Essential hypertension - Currently on amlodipine, avapro, and maxzide   DVT prophylaxis: Lovenox Code Status: Full Family Communication: d/c patient directly Disposition Plan: pending work up results    Consultants:   Cardiology   Procedures: pending   Antimicrobials: None   Subjective: Pt has no new complaints reported to me  Objective: Vitals:   11/20/15 0040 11/20/15 0200 11/20/15 0549 11/20/15 0853  BP: (!) 175/73 (!) 160/80 (!) 139/48 139/64  Pulse: (!) 49 (!) 48 (!) 48 (!) 55  Resp: 18 18 18 18   Temp: 98 F (36.7 C)  97.8 F (36.6 C) 98.5 F (36.9 C)  TempSrc: Oral  Oral Oral  SpO2: 100%  97% 99%  Weight: 65.3 kg (144 lb)     Height: 5\' 6"  (1.676 m)       Intake/Output Summary (Last 24 hours) at 11/20/15 1140 Last data filed at 11/20/15 0935  Gross per 24 hour  Intake               30 ml  Output              250 ml  Net             -220 ml   Filed Weights   11/19/15 1730 11/20/15 0040  Weight: 67.6 kg (149 lb) 65.3 kg (144 lb)    Examination:  General exam: Appears calm and comfortable, in NAD. Respiratory system: Clear to auscultation. Respiratory effort normal. Equal chest rise. Cardiovascular system: S1 & S2 heard, RRR. No JVD, murmurs, rubs, gallops or clicks. No pedal edema. Gastrointestinal system: Abdomen is nondistended, soft and nontender. No organomegaly or masses felt. Normal bowel sounds heard. Central nervous system: Alert and oriented. No focal neurological deficits. Extremities: Symmetric 5 x 5 power. Skin:  No rashes, lesions or ulcers on limited exam. Psychiatry: Judgement and insight appear normal. Mood & affect appropriate.     Data Reviewed: I have personally reviewed following labs and imaging studies  CBC:  Recent Labs Lab 11/19/15 1723  WBC 8.7  HGB 10.9*  HCT 35.5*  MCV 89.0  PLT [id number]   Basic Metabolic Panel:  Recent Labs Lab 11/19/15 1723  NA 140  K 3.9  CL 106  CO2 25  GLUCOSE 123*  BUN 25*  CREATININE 1.02*  CALCIUM 9.0   GFR: Estimated Creatinine Clearance: 45.3 mL/min (by C-G formula based on SCr of 1.02 mg/dL (H)). Liver Function Tests:  Recent Labs Lab 11/19/15 2016  AST 21  ALT 17  ALKPHOS 107  BILITOT 0.5  PROT 6.9  ALBUMIN 3.7    Recent Labs Lab 11/19/15 2016  LIPASE 43   No results for input(s): AMMONIA in the last 168 hours. Coagulation Profile: No results for input(s): INR, PROTIME in the last 168 hours. Cardiac Enzymes:  Recent Labs Lab 11/20/15 0106 11/20/15 0343 11/20/15 0652  TROPONINI <0.03 <0.03 <0.03   BNP (last 3 results) No results for input(s): PROBNP in the last 8760 hours. HbA1C: No results for input(s): HGBA1C in the last 72 hours. CBG: No results  for input(s): GLUCAP in the last 168 hours. Lipid Profile: No results for input(s): CHOL, HDL, LDLCALC, TRIG, CHOLHDL, LDLDIRECT in the last 72 hours. Thyroid Function Tests: No results for input(s): TSH, T4TOTAL, FREET4, T3FREE, THYROIDAB in the last 72 hours. Anemia Panel: No results for input(s): VITAMINB12, FOLATE, FERRITIN, TIBC, IRON, RETICCTPCT in the last 72 hours. Sepsis Labs: No results for input(s): PROCALCITON, LATICACIDVEN in the last 168 hours.  No results found for this or any previous visit (from the past 240 hour(s)).       Radiology Studies: Dg Chest 2 View  Result Date: 11/19/2015 CLINICAL DATA:  Chest pain EXAM: CHEST  2 VIEW COMPARISON:  05/26/2014 FINDINGS: The heart size and mediastinal contours are within normal limits. Aortic  atherosclerosis noted. Both lungs are clear. The visualized skeletal structures are unremarkable. IMPRESSION: No active cardiopulmonary disease. Electronically Signed   By: Kerby Moors M.D.   On: 11/19/2015 18:30        Scheduled Meds: . amLODipine  10 mg Oral Daily  . aspirin EC  81 mg Oral Daily  . enoxaparin (LOVENOX) injection  40 mg Subcutaneous Daily  . irbesartan  300 mg Oral Daily  . pantoprazole  40 mg Oral Daily  . simvastatin  20 mg Oral QHS  . triamterene-hydrochlorothiazide  1 tablet Oral Daily   Continuous Infusions:    LOS: 0 days   Time spent: > 35 minutes  Velvet Bathe, MD [facility] Pager [phone]  If 7PM-7AM, please contact night-coverage [email or web address] Password [email or web address] 11/20/2015, 11:40 AM

## 2015-11-21 DIAGNOSIS — I10 Essential (primary) hypertension: Secondary | ICD-10-CM | POA: Diagnosis not present

## 2015-11-21 DIAGNOSIS — R079 Chest pain, unspecified: Secondary | ICD-10-CM | POA: Diagnosis not present

## 2015-11-21 NOTE — Discharge Summary (Signed)
Physician Discharge Summary  Mandy Ortiz [id number] DOB: October 21, 1940 DOA: 11/19/2015  PCP: Lujean Amel, MD  Admit date: 11/19/2015 Discharge date: 11/21/2015  Time spent: > 35 minutes  Recommendations for Outpatient Follow-up:  1. Monitor hgb levels   Discharge Diagnoses:  Active Problems:   Chest pain   Essential hypertension   Discharge Condition: stable  Diet recommendation: Heart healthy  Filed Weights   11/19/15 1730 11/20/15 0040 11/21/15 0543  Weight: 67.6 kg (149 lb) 65.3 kg (144 lb) 64 kg (141 lb)    History of present illness:  75 y/o with history of HTN presenting to the hospital complaining of chest pain. Currently in house undergoing chest pain rule out  Hospital Course:  Chest pain - Evaluated by Cardiology. Pt had negative nuclear study. No further ischemia work up recommended. - Pt stable for discharge. Recommended she f/u with pcp for further evaluation and recommendations.  Procedures:  None  Consultations:  Cardiology  Discharge Exam: Vitals:   11/21/15 0543 11/21/15 1029  BP: (!) 131/51 (!) 113/58  Pulse: (!) 51 69  Resp: 18   Temp: 98 F (36.7 C)     General: Pt in nad, alert and awake Cardiovascular: rrr, no rubs Respiratory: no increased wob, no wheezes  Discharge Instructions   Discharge Instructions    Call MD for:  severe uncontrolled pain    Complete by:  As directed    Call MD for:  temperature >100.4    Complete by:  As directed    Diet - low sodium heart healthy    Complete by:  As directed    Discharge instructions    Complete by:  As directed    Please follow up with your pcp in 1-2 weeks or sooner should any new concerns arise.   Increase activity slowly    Complete by:  As directed      Current Discharge Medication List    CONTINUE these medications which have NOT CHANGED   Details  amLODipine (NORVASC) 10 MG tablet Take 1 tablet (10 mg total) by mouth daily. Qty: 30 tablet, Refills: 0    aspirin  81 MG tablet Take 81 mg by mouth every morning.     butalbital-acetaminophen-caffeine (FIORICET, ESGIC) 50-325-40 MG tablet Take 1 tablet by mouth every 6 (six) hours as needed for headache. Qty: 14 tablet, Refills: 0    CALCIUM-VITAMIN D PO Take 1 tablet by mouth every evening.    irbesartan (AVAPRO) 300 MG tablet Take 1 tablet (300 mg total) by mouth daily. Qty: 30 tablet, Refills: 0    pantoprazole (PROTONIX) 40 MG tablet Take 1 tablet (40 mg total) by mouth daily. Qty: 60 tablet, Refills: 0    simvastatin (ZOCOR) 20 MG tablet Take 1 tablet (20 mg total) by mouth at bedtime. Qty: 30 tablet, Refills: 0    triamterene-hydrochlorothiazide (DYAZIDE) 50-25 MG capsule Take 1 capsule by mouth daily.       Allergies  Allergen Reactions  . Sulfa Antibiotics Hives and Swelling  . Sulfamethoxazole Hives and Swelling      The results of significant diagnostics from this hospitalization (including imaging, microbiology, ancillary and laboratory) are listed below for reference.    Significant Diagnostic Studies: Dg Chest 2 View  Result Date: 11/19/2015 CLINICAL DATA:  Chest pain EXAM: CHEST  2 VIEW COMPARISON:  05/26/2014 FINDINGS: The heart size and mediastinal contours are within normal limits. Aortic atherosclerosis noted. Both lungs are clear. The visualized skeletal structures are unremarkable. IMPRESSION: No active cardiopulmonary disease.  Electronically Signed   By: Kerby Moors M.D.   On: 11/19/2015 18:30   Dg Tibia/fibula Left  Result Date: 10/30/2015 CLINICAL DATA:  75 year old female with injury to left comment. Midshaft left leg wound. EXAM: LEFT TIBIA AND FIBULA - 2 VIEW COMPARISON:  Radiograph dated 10/15/2015 FINDINGS: There is no acute fracture or dislocation. The bones are mildly osteopenic. No significant arthritic changes. The soft tissues appear unremarkable. No radiopaque foreign object or soft tissue gas identified. IMPRESSION: No acute findings. Electronically Signed    By: Anner Crete M.D.   On: 10/30/2015 18:47   Ct Abdomen Pelvis W Contrast  Result Date: 10/31/2015 CLINICAL DATA:  Subacute onset of generalized abdominal pain. Initial encounter. EXAM: CT ABDOMEN AND PELVIS WITH CONTRAST TECHNIQUE: Multidetector CT imaging of the abdomen and pelvis was performed using the standard protocol following bolus administration of intravenous contrast. CONTRAST:  132mL ISOVUE-300 IOPAMIDOL (ISOVUE-300) INJECTION 61% COMPARISON:  None. FINDINGS: Lower chest: Minimal bibasilar atelectasis is noted. The visualized portions of the mediastinum are unremarkable. Wall thickening is noted along the distal esophagus, concerning for esophagitis. Hepatobiliary: There is mild intrahepatic biliary ductal dilatation, with dilatation of the common bile duct to 1.6 cm. The gallbladder is unremarkable in appearance. Pancreas: A 1.2 cm cystic structure is noted at the proximal body of the pancreas. The pancreas is otherwise grossly unremarkable. Spleen: The spleen is unremarkable in appearance. Adrenals/Urinary Tract: The adrenal glands are unremarkable in appearance. Small bilateral renal cysts are seen. The kidneys are otherwise unremarkable. There is no evidence of hydronephrosis. No renal or ureteral stones are identified. No perinephric stranding is seen. Stomach/Bowel: The stomach is unremarkable in appearance. The small bowel is grossly unremarkable, with postoperative change noted at the mid to distal ileum. The appendix is not visualized; there is no evidence for appendicitis. Minimal diverticulosis is noted along the sigmoid colon, without evidence of diverticulitis. Vascular/Lymphatic: Scattered calcification is seen along the abdominal aorta and its branches. The abdominal aorta is otherwise grossly unremarkable. The inferior vena cava is grossly unremarkable. No retroperitoneal lymphadenopathy is seen. No pelvic sidewall lymphadenopathy is identified. Reproductive: The bladder is  moderately distended and grossly unremarkable. The patient is status post hysterectomy. No suspicious adnexal masses are seen. Other: No additional soft tissue abnormalities are characterized. Musculoskeletal: No acute osseous abnormalities are identified. Vacuum phenomenon is noted at L5-S1. The visualized musculature is grossly unremarkable. IMPRESSION: 1. Wall thickening along the distal esophagus raises concern for esophagitis. 2. Mild intrahepatic biliary ductal dilatation, with dilatation of the common bile duct to 1.6 cm, concerning for distal obstruction. MRCP is recommended for further evaluation. 3. **An incidental finding of potential clinical significance has been found. 1.2 cm cystic structure at the proximal body of the pancreas. Would correlate with pancreatic lab values, and evaluate further on MRCP.** 4. Small bilateral renal cysts seen. 5. Minimal diverticulosis along the sigmoid colon, without evidence of diverticulitis. Electronically Signed   By: Garald Balding M.D.   On: 10/31/2015 03:32   Mr 3d Recon At Scanner  Result Date: 10/31/2015 CLINICAL DATA:  Intra and extrahepatic biliary duct dilatation on CT scan earlier today. 12 mm pancreatic cyst. EXAM: MRI ABDOMEN WITHOUT CONTRAST  (INCLUDING MRCP) TECHNIQUE: Multiplanar multisequence MR imaging of the abdomen was performed. Heavily T2-weighted images of the biliary and pancreatic ducts were obtained, and three-dimensional MRCP images were rendered by post processing. COMPARISON:  CT scan earlier today. Virtual colonoscopy study from 11/05/2012. FINDINGS: Lower chest: 8 mm posterior left lung nodule noted (  see image 11 series 4). Hepatobiliary: Evaluation of the liver is degraded by patient breathing motion. Multiple hypervascular lesions are identified in the liver, scattered through both hepatic lobes and ranging in size from 6 mm up to a dominant lesion in the dome of the liver measuring 3 cm. This dominant lesion in the hepatic dome was  very subtle, but perceptible on the study from 11/05/2012 when it measured 2.3 cm. Other subtle hypo attenuating lesions scattered through the liver parenchyma were seen on the 2014 exam as well. Gallbladder is unremarkable. Extrahepatic common duct measures 9 mm in diameter today. Common bile duct in the head of the pancreas measures 7 mm which is upper normal for patient age. Pancreas: 12 mm cystic lesion in the head of the pancreas has homogeneous high signal intensity on T2 weighted imaging and and shows no evidence for enhancement after IV contrast administration. Relationship of this lesion to the main pancreatic duct cannot be evaluated secondary to motion artifact. There is no evidence for an enhancing mass in the pancreatic parenchyma. No dilatation of the main pancreatic duct. Spleen: No splenomegaly. No focal mass lesion. Adrenals/Urinary Tract: No adrenal nodule or mass. Multiple simple cysts are noted in the kidneys bilaterally. There is no hydronephrosis. Stomach/Bowel: Stomach is unremarkable. Duodenum is normally positioned as is the ligament of Treitz. Visualize small bowel and colon of the abdomen is nondilated. Vascular/Lymphatic: No abdominal aortic aneurysm. There is no gastrohepatic or hepatoduodenal ligament lymphadenopathy. No intraperitoneal or retroperitoneal lymphadenopathy. Other: No intraperitoneal free fluid. Musculoskeletal: No abnormal marrow enhancement within the visualized bony anatomy. IMPRESSION: 1. Multiple hypervascular lesions are identified throughout the liver. The largest lesions today were barely perceptible on the noncontrast CT scan from 11/05/2012 and have progressed only mildly in the interval. As such, these likely represent benign disease but cannot be definitively characterized on today's study. Dedicated hepatic MRI with hepatocyte specific contrast agent (Eovist) may prove helpful to further evaluate. This exam should be performed after resolution of acute  symptoms and could be performed on an outpatient basis when the patient would be best able to cooperate with positioning and reproducible breath holding as motion artifact substantially degrades image quality on today's study. 2. 12 mm cystic lesion in the head of the pancreas has simple features and is likely benign. Repeat MRI without and with contrast in 12 months is recommended. This recommendation follows ACR consensus guidelines: Management of Incidental Pancreatic Cysts: A White Paper of the ACR Incidental Findings Committee. J Am Coll Radiol [id number]. 3. Mild prominence of the extrahepatic common duct with upper normal diameter of the common bile duct. No obstructing pancreatic head mass or evidence of choledocholithiasis. No cholelithiasis. 4. **An incidental finding of potential clinical significance has been found. 8 mm left lung nodule. Non-contrast chest CT at 6-12 months is recommended. If the nodule is stable at time of repeat CT, then future CT at 18-24 months (from today's scan) is considered optional for low-risk patients, but is recommended for high-risk patients. This recommendation follows the consensus statement: Guidelines for Management of Incidental Pulmonary Nodules Detected on CT Images:From the Fleischner Society 2017; published online before print (10.1148/radiol.IJ:2314499).** Electronically Signed   By: Misty Stanley M.D.   On: 10/31/2015 11:27   Nm Myocar Multi W/spect W/wall Motion / Ef  Result Date: 11/20/2015  There was no ST segment deviation noted during stress.  The study is normal.  Nuclear stress EF: 59%.  The left ventricular ejection fraction is normal (55-65%).  Normal  stress nuclear study with no ischemia or infarction; EF 59 with normal wall motion.   [place] Extrem Low Left Ltd  Result Date: 10/30/2015 CLINICAL DATA:  [place] from ladder 3 weeks ago. Assess for anterior tibia/fibula infection or hematoma. EXAM: ULTRASOUND LEFT LOWER EXTREMITY LIMITED TECHNIQUE:  Ultrasound examination of the lower extremity soft tissues was performed in the area of clinical concern. COMPARISON:  LEFT tibia and fibula radiographs October 30, 2015 at 1812 hours FINDINGS: Heterogeneous, predominately hypoechoic 5.1 x 0.7 x 2.1 cm avascular fluid collection within pretibial soft tissues corresponding to area of redness and swelling. Fluid collection is approximate 4 mm deep to the skin surface. IMPRESSION: Avascular 5.1 x 0.7 x 2.1 cm pretibial fluid collection, in the setting of trauma this likely represents resolving hematoma. No definite abscess though, this could be confirmed with sampling as clinically indicated. Electronically Signed   By: Elon Alas M.D.   On: 10/30/2015 20:31   Mr Jeananne Rama W/wo Cm/mrcp  Result Date: 10/31/2015 CLINICAL DATA:  Intra and extrahepatic biliary duct dilatation on CT scan earlier today. 12 mm pancreatic cyst. EXAM: MRI ABDOMEN WITHOUT CONTRAST  (INCLUDING MRCP) TECHNIQUE: Multiplanar multisequence MR imaging of the abdomen was performed. Heavily T2-weighted images of the biliary and pancreatic ducts were obtained, and three-dimensional MRCP images were rendered by post processing. COMPARISON:  CT scan earlier today. Virtual colonoscopy study from 11/05/2012. FINDINGS: Lower chest: 8 mm posterior left lung nodule noted (see image 11 series 4). Hepatobiliary: Evaluation of the liver is degraded by patient breathing motion. Multiple hypervascular lesions are identified in the liver, scattered through both hepatic lobes and ranging in size from 6 mm up to a dominant lesion in the dome of the liver measuring 3 cm. This dominant lesion in the hepatic dome was very subtle, but perceptible on the study from 11/05/2012 when it measured 2.3 cm. Other subtle hypo attenuating lesions scattered through the liver parenchyma were seen on the 2014 exam as well. Gallbladder is unremarkable. Extrahepatic common duct measures 9 mm in diameter today. Common bile duct in  the head of the pancreas measures 7 mm which is upper normal for patient age. Pancreas: 12 mm cystic lesion in the head of the pancreas has homogeneous high signal intensity on T2 weighted imaging and and shows no evidence for enhancement after IV contrast administration. Relationship of this lesion to the main pancreatic duct cannot be evaluated secondary to motion artifact. There is no evidence for an enhancing mass in the pancreatic parenchyma. No dilatation of the main pancreatic duct. Spleen: No splenomegaly. No focal mass lesion. Adrenals/Urinary Tract: No adrenal nodule or mass. Multiple simple cysts are noted in the kidneys bilaterally. There is no hydronephrosis. Stomach/Bowel: Stomach is unremarkable. Duodenum is normally positioned as is the ligament of Treitz. Visualize small bowel and colon of the abdomen is nondilated. Vascular/Lymphatic: No abdominal aortic aneurysm. There is no gastrohepatic or hepatoduodenal ligament lymphadenopathy. No intraperitoneal or retroperitoneal lymphadenopathy. Other: No intraperitoneal free fluid. Musculoskeletal: No abnormal marrow enhancement within the visualized bony anatomy. IMPRESSION: 1. Multiple hypervascular lesions are identified throughout the liver. The largest lesions today were barely perceptible on the noncontrast CT scan from 11/05/2012 and have progressed only mildly in the interval. As such, these likely represent benign disease but cannot be definitively characterized on today's study. Dedicated hepatic MRI with hepatocyte specific contrast agent (Eovist) may prove helpful to further evaluate. This exam should be performed after resolution of acute symptoms and could be performed on an outpatient basis  when the patient would be best able to cooperate with positioning and reproducible breath holding as motion artifact substantially degrades image quality on today's study. 2. 12 mm cystic lesion in the head of the pancreas has simple features and is  likely benign. Repeat MRI without and with contrast in 12 months is recommended. This recommendation follows ACR consensus guidelines: Management of Incidental Pancreatic Cysts: A White Paper of the ACR Incidental Findings Committee. J Am Coll Radiol [id number]. 3. Mild prominence of the extrahepatic common duct with upper normal diameter of the common bile duct. No obstructing pancreatic head mass or evidence of choledocholithiasis. No cholelithiasis. 4. **An incidental finding of potential clinical significance has been found. 8 mm left lung nodule. Non-contrast chest CT at 6-12 months is recommended. If the nodule is stable at time of repeat CT, then future CT at 18-24 months (from today's scan) is considered optional for low-risk patients, but is recommended for high-risk patients. This recommendation follows the consensus statement: Guidelines for Management of Incidental Pulmonary Nodules Detected on CT Images:From the Fleischner Society 2017; published online before print (10.1148/radiol.IJ:2314499).** Electronically Signed   By: Misty Stanley M.D.   On: 10/31/2015 11:27    Microbiology: No results found for this or any previous visit (from the past 240 hour(s)).   Labs: Basic Metabolic Panel:  Recent Labs Lab 11/19/15 1723  NA 140  K 3.9  CL 106  CO2 25  GLUCOSE 123*  BUN 25*  CREATININE 1.02*  CALCIUM 9.0   Liver Function Tests:  Recent Labs Lab 11/19/15 2016  AST 21  ALT 17  ALKPHOS 107  BILITOT 0.5  PROT 6.9  ALBUMIN 3.7    Recent Labs Lab 11/19/15 2016  LIPASE 43   No results for input(s): AMMONIA in the last 168 hours. CBC:  Recent Labs Lab 11/19/15 1723  WBC 8.7  HGB 10.9*  HCT 35.5*  MCV 89.0  PLT 224   Cardiac Enzymes:  Recent Labs Lab 11/20/15 0106 11/20/15 0343 11/20/15 0652  TROPONINI <0.03 <0.03 <0.03   BNP: BNP (last 3 results) No results for input(s): BNP in the last 8760 hours.  ProBNP (last 3 results) No results for  input(s): PROBNP in the last 8760 hours.  CBG: No results for input(s): GLUCAP in the last 168 hours.   Signed:  Velvet Bathe MD.  [facility] 11/21/2015, 12:41 PM

## 2015-11-21 NOTE — Progress Notes (Signed)
Patient Name: Mandy Ortiz Date of Encounter: 11/21/2015  Hospital Problem List     Active Problems:   Chest pain   Essential hypertension    Patient Profile     Mandy Ortiz is a 75 year old female with a past medical history of HTN and HLD. She presented to the ED on 11/19/15 with left sided chest pain.    Subjective   She still had some mild chest pain apparently this morning.  Currently no complaints.   Inpatient Medications    . amLODipine  10 mg Oral Daily  . aspirin EC  81 mg Oral Daily  . enoxaparin (LOVENOX) injection  40 mg Subcutaneous Daily  . irbesartan  300 mg Oral Daily  . pantoprazole  40 mg Oral Daily  . regadenoson  0.4 mg Intravenous Once  . regadenoson  0.4 mg Intravenous Once  . simvastatin  20 mg Oral QHS  . triamterene-hydrochlorothiazide  1 tablet Oral Daily    Vital Signs    Vitals:   11/20/15 1545 11/20/15 1945 11/21/15 0014 11/21/15 0543  BP: 139/60 (!) 131/55 (!) 141/58 (!) 131/51  Pulse: 80 62 (!) 52 (!) 51  Resp: 18 18 18 18   Temp: 97.1 F (36.2 C) 98 F (36.7 C) 98.2 F (36.8 C) 98 F (36.7 C)  TempSrc: Oral Oral Oral Oral  SpO2: 100% 98% 100% 99%  Weight:    141 lb (64 kg)  Height:        Intake/Output Summary (Last 24 hours) at 11/21/15 0936 Last data filed at 11/21/15 0544  Gross per 24 hour  Intake              360 ml  Output             1150 ml  Net             -790 ml   Filed Weights   11/19/15 1730 11/20/15 0040 11/21/15 0543  Weight: 149 lb (67.6 kg) 144 lb (65.3 kg) 141 lb (64 kg)    Physical Exam    GEN: Well nourished, well developed, in no acute distress.  Neck: Supple, no JVD, carotid bruits, or masses. Cardiac: RRR, no rubs, or gallops. No clubbing, cyanosis, no edema.  Radials/DP/PT 2+ and equal bilaterally.  Respiratory:  Respirations  regular and unlabored, clear to auscultation bilaterally. GI: Soft, nontender, nondistended, BS + x 4. Neuro:  Strength and sensation are intact.   Labs     CBC  Recent Labs  11/19/15 1723  WBC 8.7  HGB 10.9*  HCT 35.5*  MCV 89.0  PLT [id number]   Basic Metabolic Panel  Recent Labs  11/19/15 1723  NA 140  K 3.9  CL 106  CO2 25  GLUCOSE 123*  BUN 25*  CREATININE 1.02*  CALCIUM 9.0   Liver Function Tests  Recent Labs  11/19/15 2016  AST 21  ALT 17  ALKPHOS 107  BILITOT 0.5  PROT 6.9  ALBUMIN 3.7    Recent Labs  11/19/15 2016  LIPASE 43   Cardiac Enzymes  Recent Labs  11/20/15 0106 11/20/15 0343 11/20/15 0652  TROPONINI <0.03 <0.03 <0.03   BNP Invalid input(s): POCBNP D-Dimer No results for input(s): DDIMER in the last 72 hours. Hemoglobin A1C No results for input(s): HGBA1C in the last 72 hours. Fasting Lipid Panel No results for input(s): CHOL, HDL, LDLCALC, TRIG, CHOLHDL, LDLDIRECT in the last 72 hours. Thyroid Function Tests No results for input(s): TSH, T4TOTAL, T3FREE,  THYROIDAB in the last 72 hours.  Invalid input(s): FREET3   ECG    NA  Radiology    Dg Chest 2 View  Result Date: 11/19/2015 CLINICAL DATA:  Chest pain EXAM: CHEST  2 VIEW COMPARISON:  05/26/2014 FINDINGS: The heart size and mediastinal contours are within normal limits. Aortic atherosclerosis noted. Both lungs are clear. The visualized skeletal structures are unremarkable. IMPRESSION: No active cardiopulmonary disease. Electronically Signed   By: Kerby Moors M.D.   On: 11/19/2015 18:30   Dg Tibia/fibula Left  Result Date: 10/30/2015 CLINICAL DATA:  75 year old female with injury to left comment. Midshaft left leg wound. EXAM: LEFT TIBIA AND FIBULA - 2 VIEW COMPARISON:  Radiograph dated 10/15/2015 FINDINGS: There is no acute fracture or dislocation. The bones are mildly osteopenic. No significant arthritic changes. The soft tissues appear unremarkable. No radiopaque foreign object or soft tissue gas identified. IMPRESSION: No acute findings. Electronically Signed   By: Anner Crete M.D.   On: 10/30/2015 18:47   Ct Abdomen  Pelvis W Contrast  Result Date: 10/31/2015 CLINICAL DATA:  Subacute onset of generalized abdominal pain. Initial encounter. EXAM: CT ABDOMEN AND PELVIS WITH CONTRAST TECHNIQUE: Multidetector CT imaging of the abdomen and pelvis was performed using the standard protocol following bolus administration of intravenous contrast. CONTRAST:  176mL ISOVUE-300 IOPAMIDOL (ISOVUE-300) INJECTION 61% COMPARISON:  None. FINDINGS: Lower chest: Minimal bibasilar atelectasis is noted. The visualized portions of the mediastinum are unremarkable. Wall thickening is noted along the distal esophagus, concerning for esophagitis. Hepatobiliary: There is mild intrahepatic biliary ductal dilatation, with dilatation of the common bile duct to 1.6 cm. The gallbladder is unremarkable in appearance. Pancreas: A 1.2 cm cystic structure is noted at the proximal body of the pancreas. The pancreas is otherwise grossly unremarkable. Spleen: The spleen is unremarkable in appearance. Adrenals/Urinary Tract: The adrenal glands are unremarkable in appearance. Small bilateral renal cysts are seen. The kidneys are otherwise unremarkable. There is no evidence of hydronephrosis. No renal or ureteral stones are identified. No perinephric stranding is seen. Stomach/Bowel: The stomach is unremarkable in appearance. The small bowel is grossly unremarkable, with postoperative change noted at the mid to distal ileum. The appendix is not visualized; there is no evidence for appendicitis. Minimal diverticulosis is noted along the sigmoid colon, without evidence of diverticulitis. Vascular/Lymphatic: Scattered calcification is seen along the abdominal aorta and its branches. The abdominal aorta is otherwise grossly unremarkable. The inferior vena cava is grossly unremarkable. No retroperitoneal lymphadenopathy is seen. No pelvic sidewall lymphadenopathy is identified. Reproductive: The bladder is moderately distended and grossly unremarkable. The patient is status  post hysterectomy. No suspicious adnexal masses are seen. Other: No additional soft tissue abnormalities are characterized. Musculoskeletal: No acute osseous abnormalities are identified. Vacuum phenomenon is noted at L5-S1. The visualized musculature is grossly unremarkable. IMPRESSION: 1. Wall thickening along the distal esophagus raises concern for esophagitis. 2. Mild intrahepatic biliary ductal dilatation, with dilatation of the common bile duct to 1.6 cm, concerning for distal obstruction. MRCP is recommended for further evaluation. 3. **An incidental finding of potential clinical significance has been found. 1.2 cm cystic structure at the proximal body of the pancreas. Would correlate with pancreatic lab values, and evaluate further on MRCP.** 4. Small bilateral renal cysts seen. 5. Minimal diverticulosis along the sigmoid colon, without evidence of diverticulitis. Electronically Signed   By: Garald Balding M.D.   On: 10/31/2015 03:32   Mr 3d Recon At Scanner  Result Date: 10/31/2015 CLINICAL DATA:  Intra and  extrahepatic biliary duct dilatation on CT scan earlier today. 12 mm pancreatic cyst. EXAM: MRI ABDOMEN WITHOUT CONTRAST  (INCLUDING MRCP) TECHNIQUE: Multiplanar multisequence MR imaging of the abdomen was performed. Heavily T2-weighted images of the biliary and pancreatic ducts were obtained, and three-dimensional MRCP images were rendered by post processing. COMPARISON:  CT scan earlier today. Virtual colonoscopy study from 11/05/2012. FINDINGS: Lower chest: 8 mm posterior left lung nodule noted (see image 11 series 4). Hepatobiliary: Evaluation of the liver is degraded by patient breathing motion. Multiple hypervascular lesions are identified in the liver, scattered through both hepatic lobes and ranging in size from 6 mm up to a dominant lesion in the dome of the liver measuring 3 cm. This dominant lesion in the hepatic dome was very subtle, but perceptible on the study from 11/05/2012 when it  measured 2.3 cm. Other subtle hypo attenuating lesions scattered through the liver parenchyma were seen on the 2014 exam as well. Gallbladder is unremarkable. Extrahepatic common duct measures 9 mm in diameter today. Common bile duct in the head of the pancreas measures 7 mm which is upper normal for patient age. Pancreas: 12 mm cystic lesion in the head of the pancreas has homogeneous high signal intensity on T2 weighted imaging and and shows no evidence for enhancement after IV contrast administration. Relationship of this lesion to the main pancreatic duct cannot be evaluated secondary to motion artifact. There is no evidence for an enhancing mass in the pancreatic parenchyma. No dilatation of the main pancreatic duct. Spleen: No splenomegaly. No focal mass lesion. Adrenals/Urinary Tract: No adrenal nodule or mass. Multiple simple cysts are noted in the kidneys bilaterally. There is no hydronephrosis. Stomach/Bowel: Stomach is unremarkable. Duodenum is normally positioned as is the ligament of Treitz. Visualize small bowel and colon of the abdomen is nondilated. Vascular/Lymphatic: No abdominal aortic aneurysm. There is no gastrohepatic or hepatoduodenal ligament lymphadenopathy. No intraperitoneal or retroperitoneal lymphadenopathy. Other: No intraperitoneal free fluid. Musculoskeletal: No abnormal marrow enhancement within the visualized bony anatomy. IMPRESSION: 1. Multiple hypervascular lesions are identified throughout the liver. The largest lesions today were barely perceptible on the noncontrast CT scan from 11/05/2012 and have progressed only mildly in the interval. As such, these likely represent benign disease but cannot be definitively characterized on today's study. Dedicated hepatic MRI with hepatocyte specific contrast agent (Eovist) may prove helpful to further evaluate. This exam should be performed after resolution of acute symptoms and could be performed on an outpatient basis when the patient  would be best able to cooperate with positioning and reproducible breath holding as motion artifact substantially degrades image quality on today's study. 2. 12 mm cystic lesion in the head of the pancreas has simple features and is likely benign. Repeat MRI without and with contrast in 12 months is recommended. This recommendation follows ACR consensus guidelines: Management of Incidental Pancreatic Cysts: A White Paper of the ACR Incidental Findings Committee. J Am Coll Radiol [id number]. 3. Mild prominence of the extrahepatic common duct with upper normal diameter of the common bile duct. No obstructing pancreatic head mass or evidence of choledocholithiasis. No cholelithiasis. 4. **An incidental finding of potential clinical significance has been found. 8 mm left lung nodule. Non-contrast chest CT at 6-12 months is recommended. If the nodule is stable at time of repeat CT, then future CT at 18-24 months (from today's scan) is considered optional for low-risk patients, but is recommended for high-risk patients. This recommendation follows the consensus statement: Guidelines for Management of Incidental Pulmonary Nodules  Detected on CT Images:From the Fleischner Society 2017; published online before print (10.1148/radiol.SG:[id number]).** Electronically Signed   By: Misty Stanley M.D.   On: 10/31/2015 11:27   Nm Myocar Multi W/spect W/wall Motion / Ef  Result Date: 11/20/2015  There was no ST segment deviation noted during stress.  The study is normal.  Nuclear stress EF: 59%.  The left ventricular ejection fraction is normal (55-65%).  Normal stress nuclear study with no ischemia or infarction; EF 59 with normal wall motion.   [place] Extrem Low Left Ltd  Result Date: 10/30/2015 CLINICAL DATA:  [place] from ladder 3 weeks ago. Assess for anterior tibia/fibula infection or hematoma. EXAM: ULTRASOUND LEFT LOWER EXTREMITY LIMITED TECHNIQUE: Ultrasound examination of the lower extremity soft tissues was  performed in the area of clinical concern. COMPARISON:  LEFT tibia and fibula radiographs October 30, 2015 at 1812 hours FINDINGS: Heterogeneous, predominately hypoechoic 5.1 x 0.7 x 2.1 cm avascular fluid collection within pretibial soft tissues corresponding to area of redness and swelling. Fluid collection is approximate 4 mm deep to the skin surface. IMPRESSION: Avascular 5.1 x 0.7 x 2.1 cm pretibial fluid collection, in the setting of trauma this likely represents resolving hematoma. No definite abscess though, this could be confirmed with sampling as clinically indicated. Electronically Signed   By: Elon Alas M.D.   On: 10/30/2015 20:31   Mr Jeananne Rama W/wo Cm/mrcp  Result Date: 10/31/2015 CLINICAL DATA:  Intra and extrahepatic biliary duct dilatation on CT scan earlier today. 12 mm pancreatic cyst. EXAM: MRI ABDOMEN WITHOUT CONTRAST  (INCLUDING MRCP) TECHNIQUE: Multiplanar multisequence MR imaging of the abdomen was performed. Heavily T2-weighted images of the biliary and pancreatic ducts were obtained, and three-dimensional MRCP images were rendered by post processing. COMPARISON:  CT scan earlier today. Virtual colonoscopy study from 11/05/2012. FINDINGS: Lower chest: 8 mm posterior left lung nodule noted (see image 11 series 4). Hepatobiliary: Evaluation of the liver is degraded by patient breathing motion. Multiple hypervascular lesions are identified in the liver, scattered through both hepatic lobes and ranging in size from 6 mm up to a dominant lesion in the dome of the liver measuring 3 cm. This dominant lesion in the hepatic dome was very subtle, but perceptible on the study from 11/05/2012 when it measured 2.3 cm. Other subtle hypo attenuating lesions scattered through the liver parenchyma were seen on the 2014 exam as well. Gallbladder is unremarkable. Extrahepatic common duct measures 9 mm in diameter today. Common bile duct in the head of the pancreas measures 7 mm which is upper normal for  patient age. Pancreas: 12 mm cystic lesion in the head of the pancreas has homogeneous high signal intensity on T2 weighted imaging and and shows no evidence for enhancement after IV contrast administration. Relationship of this lesion to the main pancreatic duct cannot be evaluated secondary to motion artifact. There is no evidence for an enhancing mass in the pancreatic parenchyma. No dilatation of the main pancreatic duct. Spleen: No splenomegaly. No focal mass lesion. Adrenals/Urinary Tract: No adrenal nodule or mass. Multiple simple cysts are noted in the kidneys bilaterally. There is no hydronephrosis. Stomach/Bowel: Stomach is unremarkable. Duodenum is normally positioned as is the ligament of Treitz. Visualize small bowel and colon of the abdomen is nondilated. Vascular/Lymphatic: No abdominal aortic aneurysm. There is no gastrohepatic or hepatoduodenal ligament lymphadenopathy. No intraperitoneal or retroperitoneal lymphadenopathy. Other: No intraperitoneal free fluid. Musculoskeletal: No abnormal marrow enhancement within the visualized bony anatomy. IMPRESSION: 1. Multiple hypervascular lesions are identified throughout  the liver. The largest lesions today were barely perceptible on the noncontrast CT scan from 11/05/2012 and have progressed only mildly in the interval. As such, these likely represent benign disease but cannot be definitively characterized on today's study. Dedicated hepatic MRI with hepatocyte specific contrast agent (Eovist) may prove helpful to further evaluate. This exam should be performed after resolution of acute symptoms and could be performed on an outpatient basis when the patient would be best able to cooperate with positioning and reproducible breath holding as motion artifact substantially degrades image quality on today's study. 2. 12 mm cystic lesion in the head of the pancreas has simple features and is likely benign. Repeat MRI without and with contrast in 12 months is  recommended. This recommendation follows ACR consensus guidelines: Management of Incidental Pancreatic Cysts: A White Paper of the ACR Incidental Findings Committee. J Am Coll Radiol [id number]. 3. Mild prominence of the extrahepatic common duct with upper normal diameter of the common bile duct. No obstructing pancreatic head mass or evidence of choledocholithiasis. No cholelithiasis. 4. **An incidental finding of potential clinical significance has been found. 8 mm left lung nodule. Non-contrast chest CT at 6-12 months is recommended. If the nodule is stable at time of repeat CT, then future CT at 18-24 months (from today's scan) is considered optional for low-risk patients, but is recommended for high-risk patients. This recommendation follows the consensus statement: Guidelines for Management of Incidental Pulmonary Nodules Detected on CT Images:From the Fleischner Society 2017; published online before print (10.1148/radiol.IJ:2314499).** Electronically Signed   By: Misty Stanley M.D.   On: 10/31/2015 11:27    ECHO - Left ventricle: The cavity size was normal. There was mild concentric hypertrophy. Systolic function was normal. The estimated ejection fraction was in the range of 55% to 60%. Wall motion was normal; there were no regional wall motion abnormalities. Features are consistent with a pseudonormal left ventricular filling pattern, with concomitant abnormal relaxation and increased filling pressure (grade 2 diastolic dysfunction). - Aortic valve: There was trivial regurgitation. - Mitral valve: There was mild to moderate regurgitation. - Left atrium: The atrium was severely dilated. - Atrial septum: A patent foramen ovale cannot be excluded. Recommend agitated saline contrast study. - Pulmonary arteries: PA peak pressure: 37 mm Hg (S).  Assessment & Plan    CHEST PAIN:  Negative nuclear study.  No further ischemia work up.  Discussed results with the patient.     HTN:   Controlled on current meds.  No change in therapy.      Signed, Minus Breeding, MD  11/21/2015, 9:36 AM

## 2015-11-21 NOTE — Progress Notes (Signed)
Patient Patient is discharge to home at 14:05 pm  accompanied by patient's family members and NT via wheelchair. Discharge instructions given . Patient verbalizes understanding. All personal belongings given. Telemetry box and IV removed prior to discharge and site in good condition.

## 2015-12-18 ENCOUNTER — Encounter (HOSPITAL_COMMUNITY): Payer: Self-pay | Admitting: Emergency Medicine

## 2015-12-18 ENCOUNTER — Emergency Department (HOSPITAL_COMMUNITY)
Admission: EM | Admit: 2015-12-18 | Discharge: 2015-12-19 | Disposition: A | Discharge status: Home / Self Care | Payer: Medicare Other | Attending: Emergency Medicine | Admitting: Emergency Medicine

## 2015-12-18 DIAGNOSIS — Z7982 Long term (current) use of aspirin: Secondary | ICD-10-CM | POA: Insufficient documentation

## 2015-12-18 DIAGNOSIS — I10 Essential (primary) hypertension: Secondary | ICD-10-CM | POA: Insufficient documentation

## 2015-12-18 DIAGNOSIS — R079 Chest pain, unspecified: Secondary | ICD-10-CM | POA: Diagnosis not present

## 2015-12-18 DIAGNOSIS — Z79899 Other long term (current) drug therapy: Secondary | ICD-10-CM | POA: Insufficient documentation

## 2015-12-18 DIAGNOSIS — R1013 Epigastric pain: Secondary | ICD-10-CM

## 2015-12-18 NOTE — ED Triage Notes (Signed)
Pt brought to ED by [facility] from home for c/o 9/10 mid cp that she describe as burning sensation going from her chest, abd and throat and tightness that go from her mid chest to her back. VS pta BP 121/75, HR 60, R-16, SPO2 99% RA.

## 2015-12-18 NOTE — ED Provider Notes (Signed)
[facility] DEPT Provider Note   CSN: [id number] Arrival date & time: 12/18/15  2339  By signing my name below, I, Evelene Croon, attest that this documentation has been prepared under the direction and in the presence of Orpah Greek, MD . Electronically Signed: Evelene Croon, Scribe. 12/19/2015. 6:10 AM.    History   Chief Complaint Chief Complaint  Patient presents with  . Chest Pain    The history is provided by the patient. No language interpreter was used.     HPI Comments:  Mandy Ortiz is a 75 y.o. female with a history of HTN, and HLD who presents to the Emergency Department complaining of intermittent central CP which began ~ 24 hours ago. Pt states she feels like there is a knot in her chest. She reports associated nausea, lightheadedness, and 1 episode of vomiting at time of onset. Pt reports h/o similar symptoms but states she has not been received any diagnoses for this. She also notes mild SOB and  abdominal soreness that radiates up into her chest and into her back. No alleviating factors noted. Pt has a h/o abdominal surgeries but none recently.    Past Medical History:  Diagnosis Date  . GERD (gastroesophageal reflux disease)   . Hiatal hernia   . Hyperlipidemia   . Hypertension   . Migraine     Patient Active Problem List   Diagnosis Date Noted  . AKI (acute kidney injury) ([place]) 11/01/2015  . Positive fecal occult blood test 11/01/2015  . Nodule of left lung 10/31/2015  . Liver lesions, multiple 10/31/2015  . Pancreatic lesion 10/31/2015  . History of Bell's palsy 10/31/2015  . Traumatic hematoma of lower leg with infection 10/30/2015  . Abdominal pain 10/30/2015  . Diarrhea 10/30/2015  . Essential hypertension 10/30/2015  . GERD (gastroesophageal reflux disease) 10/30/2015  . Hypertensive urgency 05/27/2014  . Chest pain 05/27/2014  . Headache 05/27/2014  . Hyperlipidemia 05/27/2014  . Migraine without aura and with status  migrainosus, not intractable     Past Surgical History:  Procedure Laterality Date  . ABDOMINAL HYSTERECTOMY    . ABDOMINAL SURGERY    . COLON SURGERY      OB History    No data available       Home Medications    Prior to Admission medications   Medication Sig Start Date End Date Taking? Authorizing Provider  amLODipine (NORVASC) 10 MG tablet Take 1 tablet (10 mg total) by mouth daily. 05/30/14   Shanker Kristeen Mans, MD  aspirin 81 MG tablet Take 81 mg by mouth every morning.     Historical Provider, MD  butalbital-acetaminophen-caffeine (FIORICET, ESGIC) [phone] MG tablet Take 1 tablet by mouth every 6 (six) hours as needed for headache. 11/03/15   Venetia Maxon Rama, MD  CALCIUM-VITAMIN D PO Take 1 tablet by mouth every evening.    Historical Provider, MD  irbesartan (AVAPRO) 300 MG tablet Take 1 tablet (300 mg total) by mouth daily. 05/30/14   Shanker Kristeen Mans, MD  pantoprazole (PROTONIX) 40 MG tablet Take 1 tablet (40 mg total) by mouth daily. 05/30/14   Shanker Kristeen Mans, MD  ranitidine (ZANTAC) 150 MG tablet Take 1 tablet (150 mg total) by mouth 2 (two) times daily. 12/19/15   Orpah Greek, MD  simvastatin (ZOCOR) 20 MG tablet Take 1 tablet (20 mg total) by mouth at bedtime. 05/30/14   Shanker Kristeen Mans, MD  sucralfate (CARAFATE) 1 GM/10ML suspension Take 10 mLs (1 g total) by  mouth 4 (four) times daily -  with meals and at bedtime. 12/19/15   Orpah Greek, MD  triamterene-hydrochlorothiazide (DYAZIDE) 50-25 MG capsule Take 1 capsule by mouth daily. 11/16/15   Historical Provider, MD    Family History Family History  Problem Relation Age of Onset  . Hypertension Mother   . Hypertension Father   . Hypertension Brother   . Heart attack Brother     Social History Social History  Substance Use Topics  . Smoking status: Never Smoker  . Smokeless tobacco: Never Used  . Alcohol use No     Allergies   Sulfa antibiotics and Sulfamethoxazole   Review of  Systems Review of Systems  Respiratory: Positive for shortness of breath.   Cardiovascular: Positive for chest pain.  Gastrointestinal: Positive for abdominal pain, nausea and vomiting (resolved).  Musculoskeletal: Positive for back pain.  Neurological: Positive for light-headedness.  All other systems reviewed and are negative.    Physical Exam Updated Vital Signs BP (!) 95/48   Pulse (!) 50   Temp 98.5 F (36.9 C) (Oral)   Resp 16   Ht 5\' 4"  (1.626 m)   Wt 141 lb (64 kg)   SpO2 97%   BMI 24.20 kg/m   Physical Exam  Constitutional: She is oriented to person, place, and time. She appears well-developed and well-nourished. No distress.  HENT:  Head: Normocephalic and atraumatic.  Right Ear: Hearing normal.  Left Ear: Hearing normal.  Nose: Nose normal.  Mouth/Throat: Oropharynx is clear and moist and mucous membranes are normal.  Eyes: Conjunctivae and EOM are normal. Pupils are equal, round, and reactive to light.  Neck: Normal range of motion. Neck supple.  Cardiovascular: Regular rhythm, S1 normal and S2 normal.  Exam reveals no gallop and no friction rub.   No murmur heard. Pulmonary/Chest: Effort normal and breath sounds normal. No respiratory distress. She exhibits no tenderness.  Abdominal: Soft. Normal appearance and bowel sounds are normal. There is no hepatosplenomegaly. There is tenderness in the epigastric area and periumbilical area. There is no rebound, no guarding, no tenderness at McBurney's point and negative Murphy's sign. No hernia.  Epi and peri  Musculoskeletal: Normal range of motion.  Neurological: She is alert and oriented to person, place, and time. She has normal strength. No cranial nerve deficit or sensory deficit. Coordination normal. GCS eye subscore is 4. GCS verbal subscore is 5. GCS motor subscore is 6.  Skin: Skin is warm, dry and intact. No rash noted. No cyanosis.  Psychiatric: She has a normal mood and affect. Her speech is normal and  behavior is normal. Thought content normal.  Nursing note and vitals reviewed.    ED Treatments / Results  DIAGNOSTIC STUDIES:  Oxygen Saturation is 98% on RA, normal by my interpretation.    COORDINATION OF CARE:  11:57 PM Discussed treatment plan with pt at bedside and pt agreed to plan.  Labs (all labs ordered are listed, but only abnormal results are displayed) Labs Reviewed  CBC - Abnormal; Notable for the following:       Result Value   Hemoglobin 11.1 (*)    HCT 34.5 (*)    All other components within normal limits  COMPREHENSIVE METABOLIC PANEL - Abnormal; Notable for the following:    Glucose, Bld 103 (*)    BUN 36 (*)    Creatinine, Ser 1.82 (*)    GFR calc non Af Amer 26 (*)    GFR calc Af Amer 30 (*)  All other components within normal limits  LIPASE, BLOOD  URINALYSIS, ROUTINE W REFLEX MICROSCOPIC (NOT AT [facility])  I-STAT TROPOININ, ED  Randolm Idol, ED    EKG  EKG Interpretation  Date/Time:  Friday December 18 2015 23:43:01 EDT Ventricular Rate:  62 PR Interval:    QRS Duration: 104 QT Interval:  447 QTC Calculation: 454 R Axis:   76 Text Interpretation:  Sinus rhythm Left ventricular hypertrophy Anterior ST elevation, probably due to LVH No significant change since last tracing Confirmed by Ajahni Nay  MD, [place] [phone]) on 12/18/2015 11:52:52 PM       Radiology Dg Chest 2 View  Result Date: 12/19/2015 CLINICAL DATA:  Initial evaluation for acute knee chest and epigastric pain. History retention. EXAM: CHEST  2 VIEW COMPARISON:  Prior radiograph from 11/19/2015. FINDINGS: Cardiac and mediastinal silhouettes are stable in size and contour, and remain within normal limits. Aortic atherosclerosis noted. Lungs are normally inflated. No focal infiltrate, pulmonary edema, or pleural effusion. No pneumothorax. Mild chronic coarsening of the interstitial markings is stable. No acute osseous abnormality. IMPRESSION: 1. No active cardiopulmonary disease.  2. Aortic atherosclerosis. Electronically Signed   By: Jeannine Boga M.D.   On: 12/19/2015 02:15    Procedures Procedures (including critical care time)  Medications Ordered in ED Medications  gi cocktail (Maalox,Lidocaine,Donnatal) (30 mLs Oral Given 12/19/15 0413)  famotidine (PEPCID) tablet 40 mg (40 mg Oral Given 12/19/15 0504)     Initial Impression / Assessment and Plan / ED Course  I have reviewed the triage vital signs and the nursing notes.  Pertinent labs & imaging results that were available during my care of the patient were reviewed by me and considered in my medical decision making (see chart for details).  Clinical Course   Patient presents to the ER for evaluation of chest pain. Patient reports a burning pain in the center of her upper abdomen and lower chest region. Pain was initially intermittent, started 24 hours ago. This evening the pain worsened. She had some nausea and lightheadedness associated with the symptoms. She does have a history of GERD. No known history of coronary artery disease. Patient's symptoms appear to be reproducible with palpation, not felt to be high likelihood of cardiac etiology. Patient has a heart score of 3. Initial EKG did not show evidence of ischemia or infarct. Troponin negative. Patient held in the ER and had a second troponin which was negative. She continued to be symptomatic here in the ER until she was given a GI cocktail, at which point the pain immediately resolved. This is felt to be good evidence that this was reflux in nature and will be treated as such. Follow-up with primary doctor and possible GI follow-up.  Final Clinical Impressions(s) / ED Diagnoses   Final diagnoses:  Epigastric pain    New Prescriptions New Prescriptions   RANITIDINE (ZANTAC) 150 MG TABLET    Take 1 tablet (150 mg total) by mouth 2 (two) times daily.   SUCRALFATE (CARAFATE) 1 GM/10ML SUSPENSION    Take 10 mLs (1 g total) by mouth 4 (four)  times daily -  with meals and at bedtime.   I personally performed the services described in this documentation, which was scribed in my presence. The recorded information has been reviewed and is accurate.     Orpah Greek, MD 12/19/15 [phone]

## 2015-12-19 ENCOUNTER — Emergency Department (HOSPITAL_COMMUNITY): Payer: Medicare Other

## 2015-12-19 LAB — CBC
HEMATOCRIT: 34.5 % — AB (ref 36.0–46.0)
HEMOGLOBIN: 11.1 g/dL — AB (ref 12.0–15.0)
MCH: 27.6 pg (ref 26.0–34.0)
MCHC: 32.2 g/dL (ref 30.0–36.0)
MCV: 85.8 fL (ref 78.0–100.0)
Platelets: 201 10*3/uL (ref 150–400)
RBC: 4.02 MIL/uL (ref 3.87–5.11)
RDW: 14.1 % (ref 11.5–15.5)
WBC: 10.2 10*3/uL (ref 4.0–10.5)

## 2015-12-19 LAB — COMPREHENSIVE METABOLIC PANEL
ALBUMIN: 3.9 g/dL (ref 3.5–5.0)
ALT: 16 U/L (ref 14–54)
ANION GAP: 12 (ref 5–15)
AST: 29 U/L (ref 15–41)
Alkaline Phosphatase: 102 U/L (ref 38–126)
BUN: 36 mg/dL — AB (ref 6–20)
CO2: 24 mmol/L (ref 22–32)
Calcium: 9.7 mg/dL (ref 8.9–10.3)
Chloride: 103 mmol/L (ref 101–111)
Creatinine, Ser: 1.82 mg/dL — ABNORMAL HIGH (ref 0.44–1.00)
GFR calc Af Amer: 30 mL/min — ABNORMAL LOW (ref >60)
GFR calc non Af Amer: 26 mL/min — ABNORMAL LOW (ref >60)
GLUCOSE: 103 mg/dL — AB (ref 65–99)
POTASSIUM: 4.6 mmol/L (ref 3.5–5.1)
SODIUM: 139 mmol/L (ref 135–145)
Total Bilirubin: 1 mg/dL (ref 0.3–1.2)
Total Protein: 6.9 g/dL (ref 6.5–8.1)

## 2015-12-19 LAB — I-STAT TROPONIN, ED
Troponin i, poc: 0 ng/mL (ref 0.00–0.08)
Troponin i, poc: 0 ng/mL (ref 0.00–0.08)

## 2015-12-19 LAB — LIPASE, BLOOD: Lipase: 39 U/L (ref 11–51)

## 2015-12-19 MED ORDER — SUCRALFATE 1 GM/10ML PO SUSP: 1.0000 g | Freq: Three times a day (TID) | ORAL | 0 refills | Status: DC

## 2015-12-19 MED ORDER — GI COCKTAIL ~~LOC~~
30.0000 mL | Freq: Once | ORAL | Status: AC
  Administered 2015-12-19: 30 mL via ORAL
  Filled 2015-12-19: qty 30

## 2015-12-19 MED ORDER — FAMOTIDINE 20 MG PO TABS
40.0000 mg | ORAL_TABLET | Freq: Once | ORAL | Status: AC
  Administered 2015-12-19: 40 mg via ORAL
  Filled 2015-12-19: qty 2

## 2015-12-19 MED ORDER — RANITIDINE HCL 150 MG PO TABS: 150.0000 mg | ORAL_TABLET | Freq: Two times a day (BID) | ORAL | 0 refills | Status: DC

## 2015-12-19 NOTE — ED Notes (Signed)
Patient returned from XRAY 

## 2015-12-19 NOTE — ED Notes (Signed)
Patient taken to XRAY

## 2016-04-28 ENCOUNTER — Encounter (HOSPITAL_BASED_OUTPATIENT_CLINIC_OR_DEPARTMENT_OTHER): Payer: Self-pay | Admitting: *Deleted

## 2016-04-28 ENCOUNTER — Emergency Department (HOSPITAL_BASED_OUTPATIENT_CLINIC_OR_DEPARTMENT_OTHER)
Admission: EM | Admit: 2016-04-28 | Discharge: 2016-04-28 | Disposition: A | Discharge status: Home / Self Care | Payer: Medicare Other | Attending: Emergency Medicine | Admitting: Emergency Medicine

## 2016-04-28 DIAGNOSIS — Z79899 Other long term (current) drug therapy: Secondary | ICD-10-CM | POA: Insufficient documentation

## 2016-04-28 DIAGNOSIS — H6121 Impacted cerumen, right ear: Secondary | ICD-10-CM | POA: Insufficient documentation

## 2016-04-28 DIAGNOSIS — I10 Essential (primary) hypertension: Secondary | ICD-10-CM | POA: Diagnosis not present

## 2016-04-28 DIAGNOSIS — H9311 Tinnitus, right ear: Secondary | ICD-10-CM | POA: Diagnosis present

## 2016-04-28 NOTE — ED Triage Notes (Signed)
States when she shakes her head she gets a high pitched sound in her right ear. Started a week ago.

## 2016-04-28 NOTE — ED Provider Notes (Signed)
Medical screening examination/treatment/procedure(s) were conducted as a shared visit with non-physician practitioner(s) and myself.  I personally evaluated the patient during the encounter.   EKG Interpretation None      76 year old female who presents with right ear fullness and ringing when she shakes her head. Right right ear cerumen impaction. Underwent cerumen disimpaction. Symptoms fully resolved. Appropriate for discharge home.    Forde Dandy, MD 04/28/16 [phone]

## 2016-04-28 NOTE — ED Provider Notes (Signed)
[facility] DEPT [facility] Provider Note   CSN: [id number] Arrival date & time: 04/28/16  1206     History   Chief Complaint Chief Complaint  Patient presents with  . Tinnitus    HPI Mandy Ortiz is a 76 y.o. female.  HPI  Mandy Ortiz is a 76 y.o. female, with a history of HTN and GERD, presenting to the ED with ear fullness and ringing in the right ear beginning about a week ago. Ear fullness is constant and has not been worsening. High pitched sound in her ear comes on when she turns her head side to side. This high pitched note lasts for a second or two and then resolves. She has never had this before. No new medications. No antibiotic use in previous three months. Also endorses some muffled hearing in the right ear. 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    Denies dizziness, N/V, fever/chills, neuro deficits, vision changes, headache, or any other complaints.      Past Medical History:  Diagnosis Date  . GERD (gastroesophageal reflux disease)   . Hiatal hernia   . Hyperlipidemia   . Hypertension   . Migraine     Patient Active Problem List   Diagnosis Date Noted  . AKI (acute kidney injury) ([place]) 11/01/2015  . Positive fecal occult blood test 11/01/2015  . Nodule of left lung 10/31/2015  . Liver lesions, multiple 10/31/2015  . Pancreatic lesion 10/31/2015  . History of Bell's palsy 10/31/2015  . Traumatic hematoma of lower leg with infection 10/30/2015  . Abdominal pain 10/30/2015  . Diarrhea 10/30/2015  . Essential hypertension 10/30/2015  . GERD (gastroesophageal reflux disease) 10/30/2015  . Hypertensive urgency 05/27/2014  . Chest pain 05/27/2014  . Headache 05/27/2014  . Hyperlipidemia 05/27/2014  . Migraine without aura and with status migrainosus, not intractable     Past Surgical History:  Procedure Laterality Date  . ABDOMINAL HYSTERECTOMY    . ABDOMINAL SURGERY    . COLON SURGERY        OB History    No data available       Home Medications    Prior to Admission medications   Medication Sig Start Date End Date Taking? Authorizing Provider  amLODipine (NORVASC) 10 MG tablet Take 1 tablet (10 mg total) by mouth daily. 05/30/14   Shanker Kristeen Mans, MD  aspirin 81 MG tablet Take 81 mg by mouth every morning.     Historical Provider, MD  butalbital-acetaminophen-caffeine (FIORICET, ESGIC) [phone] MG tablet Take 1 tablet by mouth every 6 (six) hours as needed for headache. 11/03/15   Venetia Maxon Rama, MD  CALCIUM-VITAMIN D PO Take 1 tablet by mouth every evening.    Historical Provider, MD  irbesartan (AVAPRO) 300 MG tablet Take 1 tablet (300 mg total) by mouth daily. 05/30/14   Shanker Kristeen Mans, MD  pantoprazole (PROTONIX) 40 MG tablet Take 1 tablet (40 mg total) by mouth daily. 05/30/14   Shanker Kristeen Mans, MD  ranitidine (ZANTAC) 150 MG tablet Take 1 tablet (150 mg total) by mouth 2 (two) times daily. 12/19/15   Orpah Greek, MD  simvastatin (ZOCOR) 20 MG tablet Take 1 tablet (20 mg total) by mouth at bedtime. 05/30/14   Shanker Kristeen Mans, MD  sucralfate (CARAFATE) 1 GM/10ML suspension Take 10 mLs (1 g total) by mouth 4 (four) times daily -  with meals and at bedtime. 12/19/15   Harrell Gave  Hewitt Shorts, MD  triamterene-hydrochlorothiazide (DYAZIDE) 50-25 MG capsule Take 1 capsule by mouth daily. 11/16/15   Historical Provider, MD    Family History Family History  Problem Relation Age of Onset  . Hypertension Mother   . Hypertension Father   . Hypertension Brother   . Heart attack Brother     Social History Social History  Substance Use Topics  . Smoking status: Never Smoker  . Smokeless tobacco: Never Used  . Alcohol use No     Allergies   Sulfa antibiotics and Sulfamethoxazole   Review of Systems Review of Systems  Constitutional: Negative for chills and fever.  HENT: Negative for congestion, ear discharge, ear pain, facial swelling, sinus pain,  sore throat, trouble swallowing and voice change.   Gastrointestinal: Negative for nausea and vomiting.  Neurological: Negative for dizziness, weakness, numbness and headaches.     Physical Exam Updated Vital Signs BP 146/67 (BP Location: Right Arm)   Pulse 61   Temp 98 F (36.7 C) (Oral)   Resp 16   Ht 5\' 6"  (1.676 m)   Wt 62.1 kg   SpO2 98%   BMI 22.11 kg/m   Physical Exam  Constitutional: She appears well-developed and well-nourished. No distress.  HENT:  Head: Normocephalic and atraumatic.  Mouth/Throat: Oropharynx is clear and moist.  Eyes: Conjunctivae are normal.  Neck: Neck supple.  Cardiovascular: Normal rate and regular rhythm.   Pulmonary/Chest: Effort normal.  Neurological: She is alert.  Movement in the right side of the face is limited due to patient's Bell's palsy. Pt states this is normal for her and there has been no change.   Cerumen impaction bilaterally, complete impaction on the right.   Skin: Skin is warm and dry. She is not diaphoretic.  Psychiatric: She has a normal mood and affect. Her behavior is normal.  Nursing note and vitals reviewed.    ED Treatments / Results  Labs (all labs ordered are listed, but only abnormal results are displayed) Labs Reviewed - No data to display  EKG  EKG Interpretation None       Radiology No results found.  Procedures .Ear Cerumen Removal Date/Time: 04/28/2016 12:38 PM Performed by: Lorayne Bender Authorized by: Lorayne Bender   Consent:    Consent obtained:  Verbal   Consent given by:  Patient   Risks discussed:  Dizziness, infection, incomplete removal, TM perforation and pain Procedure details:    Location:  R ear   Procedure type: irrigation   Post-procedure details:    Inspection:  TM intact   Hearing quality:  Improved   Patient tolerance of procedure:  Tolerated well, no immediate complications Comments:     Performed by RN with evaluation before and after. I was available for consult  throughout the procedure.     (including critical care time)  Medications Ordered in ED Medications - No data to display   Initial Impression / Assessment and Plan / ED Course  I have reviewed the triage vital signs and the nursing notes.  Pertinent labs & imaging results that were available during my care of the patient were reviewed by me and considered in my medical decision making (see chart for details).     Patient presents with ear fullness. Cerumen impaction noted and corrected. Patient voiced resolution of symptoms. PCP follow-up as needed. Continued care and return precautions discussed. Patient voiced understanding of all instructions and is comfortable with discharge.  Findings and plan of care discussed with Hinton Dyer  Oleta Mouse, MD. Dr. Oleta Mouse personally evaluated and examined this patient.  Final Clinical Impressions(s) / ED Diagnoses   Final diagnoses:  Impacted cerumen of right ear    New Prescriptions Discharge Medication List as of 04/28/2016  1:44 PM       Lorayne Bender, PA-C 04/29/16 Johnson Liu, MD 04/30/16 [phone]

## 2016-04-28 NOTE — Discharge Instructions (Signed)
You had a build up of ear wax in your ears, which was flushed out here in the ED. Use the attached information sheets to repeat these flushes, as needed. Follow up with your primary care provider for any further management of this issue.

## 2016-04-28 NOTE — ED Notes (Signed)
Large piece of cerum irrigated  From rt ear with approx  200cc warm water  And peroxide.  Pt tolerated well and states feels better and can hear better

## 2016-09-21 ENCOUNTER — Other Ambulatory Visit: Payer: Self-pay | Admitting: Family Medicine

## 2016-09-21 DIAGNOSIS — R911 Solitary pulmonary nodule: Secondary | ICD-10-CM

## 2016-09-25 ENCOUNTER — Emergency Department (HOSPITAL_BASED_OUTPATIENT_CLINIC_OR_DEPARTMENT_OTHER)
Admission: EM | Admit: 2016-09-25 | Discharge: 2016-09-25 | Disposition: A | Discharge status: Home / Self Care | Payer: Medicare Other | Attending: Emergency Medicine | Admitting: Emergency Medicine

## 2016-09-25 ENCOUNTER — Encounter (HOSPITAL_BASED_OUTPATIENT_CLINIC_OR_DEPARTMENT_OTHER): Payer: Self-pay | Admitting: Emergency Medicine

## 2016-09-25 DIAGNOSIS — Y999 Unspecified external cause status: Secondary | ICD-10-CM | POA: Insufficient documentation

## 2016-09-25 DIAGNOSIS — Y939 Activity, unspecified: Secondary | ICD-10-CM | POA: Insufficient documentation

## 2016-09-25 DIAGNOSIS — I10 Essential (primary) hypertension: Secondary | ICD-10-CM | POA: Insufficient documentation

## 2016-09-25 DIAGNOSIS — T63441A Toxic effect of venom of bees, accidental (unintentional), initial encounter: Secondary | ICD-10-CM | POA: Insufficient documentation

## 2016-09-25 DIAGNOSIS — Z7982 Long term (current) use of aspirin: Secondary | ICD-10-CM | POA: Diagnosis not present

## 2016-09-25 DIAGNOSIS — Z79899 Other long term (current) drug therapy: Secondary | ICD-10-CM | POA: Diagnosis not present

## 2016-09-25 DIAGNOSIS — R600 Localized edema: Secondary | ICD-10-CM | POA: Diagnosis not present

## 2016-09-25 DIAGNOSIS — R229 Localized swelling, mass and lump, unspecified: Secondary | ICD-10-CM

## 2016-09-25 DIAGNOSIS — Y929 Unspecified place or not applicable: Secondary | ICD-10-CM | POA: Insufficient documentation

## 2016-09-25 MED ORDER — DIPHENHYDRAMINE HCL 50 MG/ML IJ SOLN
25.0000 mg | Freq: Once | INTRAMUSCULAR | Status: AC
  Administered 2016-09-25: 25 mg via INTRAVENOUS
  Filled 2016-09-25: qty 1

## 2016-09-25 MED ORDER — EPINEPHRINE 0.3 MG/0.3ML IJ SOAJ
INTRAMUSCULAR | Status: AC
  Filled 2016-09-25: qty 0.3

## 2016-09-25 MED ORDER — FAMOTIDINE IN NACL 20-0.9 MG/50ML-% IV SOLN
20.0000 mg | Freq: Once | INTRAVENOUS | Status: AC
  Administered 2016-09-25: 20 mg via INTRAVENOUS
  Filled 2016-09-25: qty 50

## 2016-09-25 NOTE — ED Notes (Signed)
Noted swelling to upper lip is getting larger at this time.

## 2016-09-25 NOTE — ED Notes (Signed)
Alert, NAD, calm, interactive, resps e/u, speaking in clear complete sentences, no dyspnea noted, skin W&D, VSS, (denies: sob, itching, nausea, difficulty swallowing, dizziness or visual changes). Family at [facility].

## 2016-09-25 NOTE — Discharge Instructions (Signed)
Take 25 mg of Benadryl every 6-8 hours as needed for itching and swelling. Continue to use your Zantac daily as this also provides antihistamine coverage.

## 2016-09-25 NOTE — ED Notes (Signed)
EDP in to see, update, no changes.

## 2016-09-25 NOTE — ED Provider Notes (Signed)
[facility] Provider Note   CSN: [id number] Arrival date & time: 09/25/16  1813  By signing my name below, I, Mandy Ortiz, attest that this documentation has been prepared under the direction and in the presence of physician practitioner, Chevon Fomby, Grayce Sessions, MD. Electronically Signed: Dora Ortiz, Scribe. 09/25/2016. 7:08 PM.  History   Chief Complaint Chief Complaint  Patient presents with  . Allergic Reaction   The history is provided by the patient. No language interpreter was used.    HPI Comments: Mandy Ortiz is a 76 y.o. female who presents to the Emergency Department for evaluation of an possible allergic reaction that began shortly prior to arrival. She was stung by bees on her upper extremities and face. Shortly thereafter she developed swelling to her top lip where she was stung. Patient has been bitten by bees in the past with subsequent local swelling and has never had an anaphylactic reaction. 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 She has never had to use an EpiPen. Patient denies dysphagia, dyspnea, wheezing, nausea, vomiting, headaches, or any other associated symptoms.  Past Medical History:  Diagnosis Date  . GERD (gastroesophageal reflux disease)   . Hiatal hernia   . Hyperlipidemia   . Hypertension   . Migraine     Patient Active Problem List   Diagnosis Date Noted  . AKI (acute kidney injury) ([place]) 11/01/2015  . Positive fecal occult blood test 11/01/2015  . Nodule of left lung 10/31/2015  . Liver lesions, multiple 10/31/2015  . Pancreatic lesion 10/31/2015  . History of Bell's palsy 10/31/2015  . Traumatic hematoma of lower leg with infection 10/30/2015  . Abdominal pain 10/30/2015  . Diarrhea 10/30/2015  . Essential hypertension 10/30/2015  . GERD (gastroesophageal reflux disease) 10/30/2015  . Hypertensive urgency 05/27/2014  . Chest pain 05/27/2014  . Headache 05/27/2014  .  Hyperlipidemia 05/27/2014  . Migraine without aura and with status migrainosus, not intractable     Past Surgical History:  Procedure Laterality Date  . ABDOMINAL HYSTERECTOMY    . ABDOMINAL SURGERY    . COLON SURGERY      OB History    No data available       Home Medications    Prior to Admission medications   Medication Sig Start Date End Date Taking? Authorizing Provider  amLODipine (NORVASC) 10 MG tablet Take 1 tablet (10 mg total) by mouth daily. 05/30/14   Ghimire, Henreitta Leber, MD  aspirin 81 MG tablet Take 81 mg by mouth every morning.     [provider]  butalbital-acetaminophen-caffeine (FIORICET, ESGIC) [phone] MG tablet Take 1 tablet by mouth every 6 (six) hours as needed for headache. 11/03/15   Rama, Venetia Maxon, MD  CALCIUM-VITAMIN D PO Take 1 tablet by mouth every evening.    [provider]  irbesartan (AVAPRO) 300 MG tablet Take 1 tablet (300 mg total) by mouth daily. 05/30/14   Ghimire, Henreitta Leber, MD  pantoprazole (PROTONIX) 40 MG tablet Take 1 tablet (40 mg total) by mouth daily. 05/30/14   Ghimire, Henreitta Leber, MD  ranitidine (ZANTAC) 150 MG tablet Take 1 tablet (150 mg total) by mouth 2 (two) times daily. 12/19/15   Orpah Greek, MD  simvastatin (ZOCOR) 20 MG tablet Take 1 tablet (20 mg total) by mouth at bedtime. 05/30/14   Ghimire, Henreitta Leber, MD  sucralfate (CARAFATE) 1 GM/10ML suspension Take 10 mLs (1 g total) by mouth  4 (four) times daily -  with meals and at bedtime. 12/19/15   Orpah Greek, MD  triamterene-hydrochlorothiazide (DYAZIDE) 50-25 MG capsule Take 1 capsule by mouth daily. 11/16/15   [provider]    Family History Family History  Problem Relation Age of Onset  . Hypertension Mother   . Hypertension Father   . Hypertension Brother   . Heart attack Brother     Social History Social History  Substance Use Topics  . Smoking status: Never Smoker  . Smokeless tobacco: Never Used  . Alcohol use No      Allergies   Sulfa antibiotics and Sulfamethoxazole   Review of Systems Review of Systems All other systems reviewed and are negative for acute change except as noted in the HPI. Physical Exam Updated Vital Signs BP 132/74   Pulse (!) 53   Temp 98.4 F (36.9 C) (Oral)   Resp 12   Ht 5\' 6"  (1.676 m)   Wt 69.9 kg (154 lb)   SpO2 98%   BMI 24.86 kg/m   Physical Exam  Constitutional: She is oriented to person, place, and time. She appears well-developed and well-nourished. No distress.  HENT:  Head: Normocephalic and atraumatic.    Nose: Nose normal.  Mouth/Throat: Uvula is midline and mucous membranes are normal. No trismus in the jaw. No uvula swelling. No posterior oropharyngeal edema. Tonsils are 0 on the right. Tonsils are 0 on the left.  Eyes: Pupils are equal, round, and reactive to light. Conjunctivae and EOM are normal. Right eye exhibits no discharge. Left eye exhibits no discharge. No scleral icterus.  Neck: Normal range of motion. Neck supple.  Cardiovascular: Normal rate and regular rhythm.  Exam reveals no gallop and no friction rub.   No murmur heard. Pulmonary/Chest: Effort normal and breath sounds normal. No stridor. No respiratory distress. She has no rales.  Abdominal: Soft. She exhibits no distension. There is no tenderness.  Musculoskeletal: She exhibits no edema or tenderness.  Multiple sting sites to left upper and right upper extremity with localized swelling. No retained stingers noted.  Neurological: She is alert and oriented to person, place, and time.  Skin: Skin is warm and dry. No rash noted. She is not diaphoretic. No erythema.  Psychiatric: She has a normal mood and affect.  Vitals reviewed.    ED Treatments / Results  Labs (all labs ordered are listed, but only abnormal results are displayed) Labs Reviewed - No data to display  EKG  EKG Interpretation None       Radiology No results found.  Procedures Procedures (including  critical care time)  DIAGNOSTIC STUDIES: Oxygen Saturation is 100% on RA, normal by my interpretation.    COORDINATION OF CARE: 7:08 PM Discussed treatment plan with pt at bedside and pt agreed to plan.  Medications Ordered in ED Medications  EPINEPHrine (EPI-PEN) 0.3 mg/0.3 mL injection (  Not Given 09/25/16 1830)  famotidine (PEPCID) IVPB 20 mg premix (0 mg Intravenous Stopped 09/25/16 1921)  diphenhydrAMINE (BENADRYL) injection 25 mg (25 mg Intravenous Given 09/25/16 1845)     Initial Impression / Assessment and Plan / ED Course  I have reviewed the triage vital signs and the nursing notes.  Pertinent labs & imaging results that were available during my care of the patient were reviewed by me and considered in my medical decision making (see chart for details).     Presentation consistent with localized swelling from bee stings. Patient without respiratory distress, oral swelling,  wheezing, nausea/vomiting. No evidence to suggest anaphylaxis. Given IV Benadryl and Pepcid. Swelling did have mild to moderate improvement. Patient was monitored for approximately 5 hours without progression of her symptomatology or evidence suggestive of anaphylaxis.  The patient is safe for discharge with strict return precautions.   Final Clinical Impressions(s) / ED Diagnoses   Final diagnoses:  Bee sting, accidental or unintentional, initial encounter  Local superficial swelling   Disposition: Discharge  Condition: Good  I have discussed the results, Dx and Tx plan with the patient and family who expressed understanding and agree(s) with the plan. Discharge instructions discussed at great length. The patient and family were given strict return precautions who verbalized understanding of the instructions. No further questions at time of discharge.    New Prescriptions   No medications on file    Follow Up: Lujean Amel, [employer] [address] [place]  [address] [phone]  Schedule an appointment as soon as possible for a visit  As needed   I personally performed the services described in this documentation, which was scribed in my presence. The recorded information has been reviewed and is accurate.        Fatima Blank, MD 09/25/16 2245

## 2016-09-25 NOTE — ED Notes (Signed)
EDP in to see, update.  ?

## 2016-09-25 NOTE — ED Triage Notes (Signed)
Patient was stung by multiple bees. The patient reports that she is allergic to bees. The patient has swelling noted to the right side of her mouth and burning to other areas that were stung. The patient is in no noted distress, but states that she is "not really" have trouble breathing or swallowing. Reports that she feels lightheaded

## 2016-10-07 ENCOUNTER — Ambulatory Visit
Admission: RE | Admit: 2016-10-07 | Discharge: 2016-10-07 | Disposition: A | Discharge status: Home / Self Care | Payer: Medicare Other | Source: Ambulatory Visit | Attending: Family Medicine | Admitting: Family Medicine

## 2016-10-07 ENCOUNTER — Other Ambulatory Visit: Payer: Self-pay | Admitting: Family Medicine

## 2016-10-07 DIAGNOSIS — Z1231 Encounter for screening mammogram for malignant neoplasm of breast: Secondary | ICD-10-CM

## 2016-10-07 DIAGNOSIS — R911 Solitary pulmonary nodule: Secondary | ICD-10-CM

## 2016-10-11 ENCOUNTER — Ambulatory Visit
Admission: RE | Admit: 2016-10-11 | Discharge: 2016-10-11 | Disposition: A | Discharge status: Home / Self Care | Payer: Medicare Other | Source: Ambulatory Visit | Attending: Family Medicine | Admitting: Family Medicine

## 2016-10-11 DIAGNOSIS — Z1231 Encounter for screening mammogram for malignant neoplasm of breast: Secondary | ICD-10-CM

## 2016-10-13 ENCOUNTER — Other Ambulatory Visit: Payer: Self-pay | Admitting: Family Medicine

## 2016-10-13 DIAGNOSIS — R928 Other abnormal and inconclusive findings on diagnostic imaging of breast: Secondary | ICD-10-CM

## 2016-11-01 ENCOUNTER — Ambulatory Visit
Admission: RE | Admit: 2016-11-01 | Discharge: 2016-11-01 | Disposition: A | Discharge status: Home / Self Care | Payer: Medicare Other | Source: Ambulatory Visit | Attending: Family Medicine | Admitting: Family Medicine

## 2016-11-01 DIAGNOSIS — R928 Other abnormal and inconclusive findings on diagnostic imaging of breast: Secondary | ICD-10-CM

## 2016-11-15 ENCOUNTER — Other Ambulatory Visit: Payer: Self-pay | Admitting: Family Medicine

## 2016-11-15 DIAGNOSIS — K862 Cyst of pancreas: Secondary | ICD-10-CM

## 2016-11-29 ENCOUNTER — Ambulatory Visit
Admission: RE | Admit: 2016-11-29 | Discharge: 2016-11-29 | Disposition: A | Discharge status: Home / Self Care | Payer: Self-pay | Source: Ambulatory Visit | Attending: Family Medicine | Admitting: Family Medicine

## 2016-11-29 DIAGNOSIS — K862 Cyst of pancreas: Secondary | ICD-10-CM

## 2016-12-13 ENCOUNTER — Other Ambulatory Visit: Payer: Commercial Managed Care - HMO

## 2016-12-15 ENCOUNTER — Ambulatory Visit
Admission: RE | Admit: 2016-12-15 | Discharge: 2016-12-15 | Disposition: A | Discharge status: Home / Self Care | Payer: Commercial Managed Care - HMO | Source: Ambulatory Visit | Attending: Family Medicine | Admitting: Family Medicine

## 2016-12-15 MED ORDER — GADOXETATE DISODIUM 0.25 MMOL/ML IV SOLN
6.0000 mL | Freq: Once | INTRAVENOUS | Status: AC | PRN
  Administered 2016-12-15: 6 mL via INTRAVENOUS

## 2017-03-24 DIAGNOSIS — Z23 Encounter for immunization: Secondary | ICD-10-CM | POA: Diagnosis not present

## 2017-03-24 DIAGNOSIS — I10 Essential (primary) hypertension: Secondary | ICD-10-CM | POA: Diagnosis not present

## 2017-03-24 DIAGNOSIS — M25512 Pain in left shoulder: Secondary | ICD-10-CM | POA: Diagnosis not present

## 2017-03-29 DIAGNOSIS — J069 Acute upper respiratory infection, unspecified: Secondary | ICD-10-CM | POA: Diagnosis not present

## 2017-03-29 DIAGNOSIS — I10 Essential (primary) hypertension: Secondary | ICD-10-CM | POA: Diagnosis not present

## 2017-03-31 DIAGNOSIS — M67912 Unspecified disorder of synovium and tendon, left shoulder: Secondary | ICD-10-CM | POA: Diagnosis not present

## 2017-04-14 DIAGNOSIS — J0100 Acute maxillary sinusitis, unspecified: Secondary | ICD-10-CM | POA: Diagnosis not present

## 2017-05-03 DIAGNOSIS — M67912 Unspecified disorder of synovium and tendon, left shoulder: Secondary | ICD-10-CM | POA: Diagnosis not present

## 2017-07-03 IMAGING — DX DG CHEST 2V
2 series · 2 of 2 positions shown · non-contrast
Comparison: 05/26/2014

CLINICAL DATA: Chest pain

EXAM:
CHEST  2 VIEW

[w chest pa]
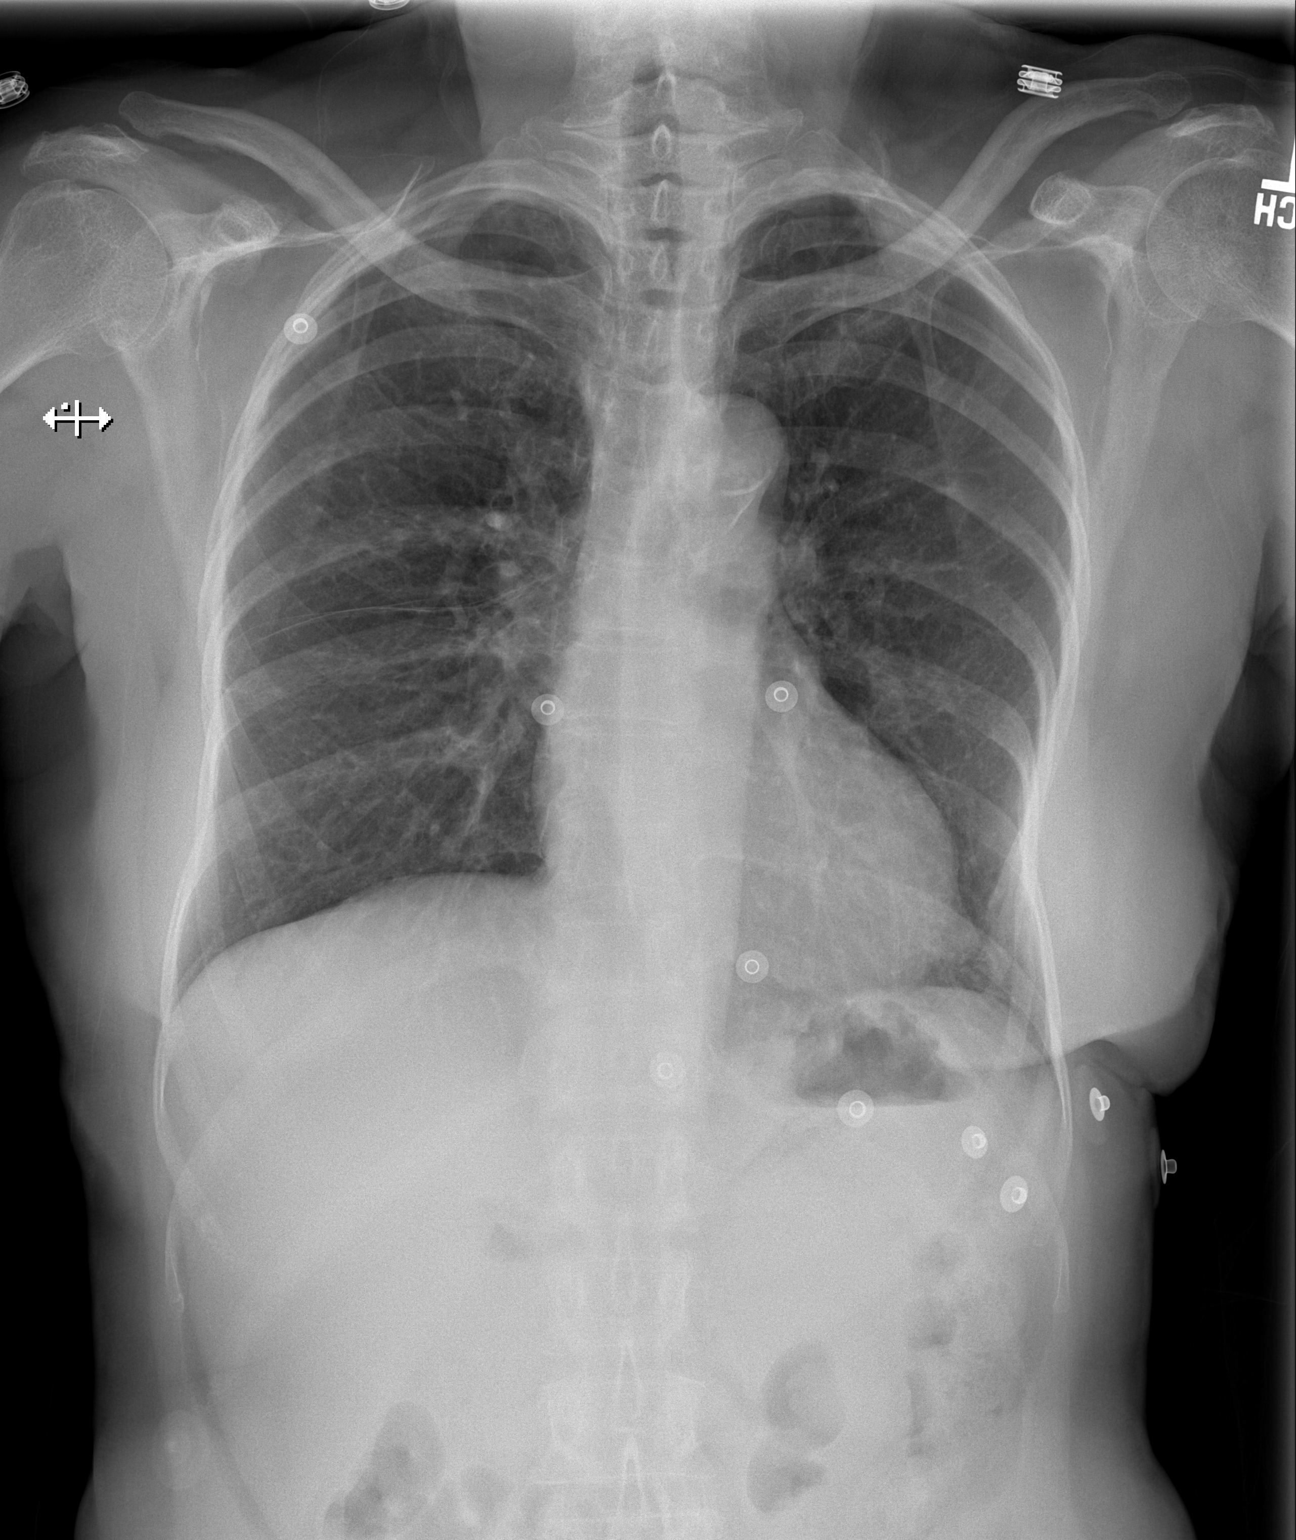

[w chest lat]
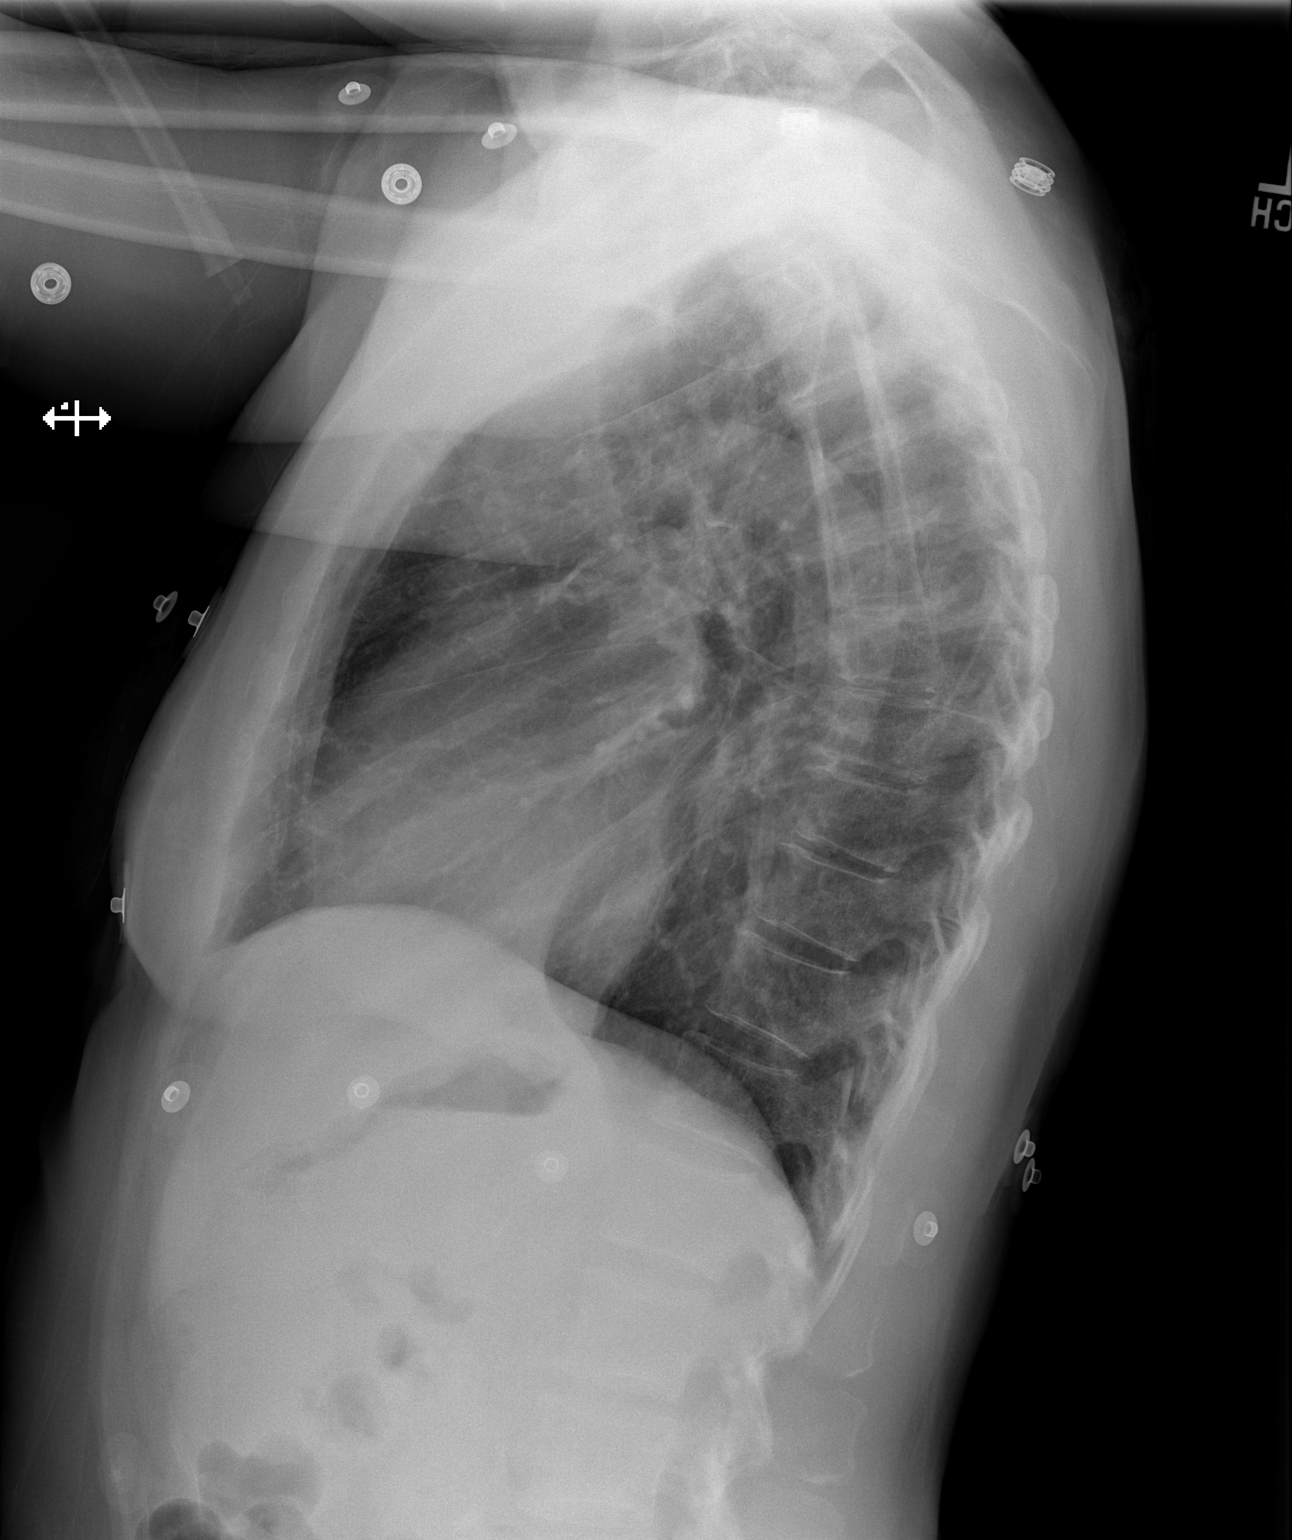

[2 of 2 positions shown; findings below may reference images not displayed]

FINDINGS: The heart size and mediastinal contours are within normal limits.
Aortic atherosclerosis noted. Both lungs are clear. The visualized
skeletal structures are unremarkable.
IMPRESSION: No active cardiopulmonary disease.

## 2017-08-14 DIAGNOSIS — H52209 Unspecified astigmatism, unspecified eye: Secondary | ICD-10-CM | POA: Diagnosis not present

## 2017-08-14 DIAGNOSIS — H524 Presbyopia: Secondary | ICD-10-CM | POA: Diagnosis not present

## 2017-08-14 DIAGNOSIS — H5213 Myopia, bilateral: Secondary | ICD-10-CM | POA: Diagnosis not present

## 2017-09-04 ENCOUNTER — Emergency Department (HOSPITAL_BASED_OUTPATIENT_CLINIC_OR_DEPARTMENT_OTHER): Payer: Medicare HMO

## 2017-09-04 ENCOUNTER — Encounter (HOSPITAL_BASED_OUTPATIENT_CLINIC_OR_DEPARTMENT_OTHER): Payer: Self-pay | Admitting: *Deleted

## 2017-09-04 ENCOUNTER — Other Ambulatory Visit: Payer: Self-pay

## 2017-09-04 ENCOUNTER — Emergency Department (HOSPITAL_BASED_OUTPATIENT_CLINIC_OR_DEPARTMENT_OTHER)
Admission: EM | Admit: 2017-09-04 | Discharge: 2017-09-04 | Disposition: A | Discharge status: Home / Self Care | Payer: Medicare HMO | Attending: Emergency Medicine | Admitting: Emergency Medicine

## 2017-09-04 DIAGNOSIS — R509 Fever, unspecified: Secondary | ICD-10-CM | POA: Diagnosis not present

## 2017-09-04 DIAGNOSIS — R05 Cough: Secondary | ICD-10-CM | POA: Diagnosis not present

## 2017-09-04 DIAGNOSIS — Z7982 Long term (current) use of aspirin: Secondary | ICD-10-CM | POA: Diagnosis not present

## 2017-09-04 DIAGNOSIS — J189 Pneumonia, unspecified organism: Secondary | ICD-10-CM | POA: Insufficient documentation

## 2017-09-04 DIAGNOSIS — Z79899 Other long term (current) drug therapy: Secondary | ICD-10-CM | POA: Insufficient documentation

## 2017-09-04 DIAGNOSIS — I10 Essential (primary) hypertension: Secondary | ICD-10-CM | POA: Insufficient documentation

## 2017-09-04 LAB — COMPREHENSIVE METABOLIC PANEL
ALK PHOS: 117 U/L (ref 38–126)
ALT: 17 U/L (ref 0–44)
ANION GAP: 8 (ref 5–15)
AST: 28 U/L (ref 15–41)
Albumin: 3.9 g/dL (ref 3.5–5.0)
BILIRUBIN TOTAL: 0.9 mg/dL (ref 0.3–1.2)
BUN: 20 mg/dL (ref 8–23)
CALCIUM: 8.9 mg/dL (ref 8.9–10.3)
CO2: 26 mmol/L (ref 22–32)
CREATININE: 1.53 mg/dL — AB (ref 0.44–1.00)
Chloride: 104 mmol/L (ref 98–111)
GFR calc Af Amer: 37 mL/min — ABNORMAL LOW (ref >60)
GFR calc non Af Amer: 32 mL/min — ABNORMAL LOW (ref >60)
GLUCOSE: 105 mg/dL — AB (ref 70–99)
Potassium: 4.3 mmol/L (ref 3.5–5.1)
Sodium: 138 mmol/L (ref 135–145)
TOTAL PROTEIN: 7.4 g/dL (ref 6.5–8.1)

## 2017-09-04 LAB — URINALYSIS, ROUTINE W REFLEX MICROSCOPIC
BILIRUBIN URINE: NEGATIVE
Glucose, UA: NEGATIVE mg/dL
Hgb urine dipstick: NEGATIVE
Ketones, ur: NEGATIVE mg/dL
Leukocytes, UA: NEGATIVE
Nitrite: NEGATIVE
PH: 6 (ref 5.0–8.0)
Protein, ur: NEGATIVE mg/dL

## 2017-09-04 LAB — CBC WITH DIFFERENTIAL/PLATELET
Basophils Absolute: 0 10*3/uL (ref 0.0–0.1)
Basophils Relative: 0 %
Eosinophils Absolute: 0.1 10*3/uL (ref 0.0–0.7)
Eosinophils Relative: 1 %
HEMATOCRIT: 35.2 % — AB (ref 36.0–46.0)
HEMOGLOBIN: 11.2 g/dL — AB (ref 12.0–15.0)
LYMPHS PCT: 10 %
Lymphs Abs: 1.2 10*3/uL (ref 0.7–4.0)
MCH: 27.2 pg (ref 26.0–34.0)
MCHC: 31.8 g/dL (ref 30.0–36.0)
MCV: 85.4 fL (ref 78.0–100.0)
MONO ABS: 1.2 10*3/uL — AB (ref 0.1–1.0)
MONOS PCT: 10 %
Neutro Abs: 9.7 10*3/uL — ABNORMAL HIGH (ref 1.7–7.7)
Neutrophils Relative %: 79 %
Platelets: 182 10*3/uL (ref 150–400)
RBC: 4.12 MIL/uL (ref 3.87–5.11)
RDW: 14.5 % (ref 11.5–15.5)
WBC: 12.1 10*3/uL — ABNORMAL HIGH (ref 4.0–10.5)

## 2017-09-04 LAB — I-STAT CG4 LACTIC ACID, ED: Lactic Acid, Venous: 1.84 mmol/L (ref 0.5–1.9)

## 2017-09-04 MED ORDER — DOXYCYCLINE HYCLATE 100 MG PO TABS
100.0000 mg | ORAL_TABLET | Freq: Once | ORAL | Status: AC
  Administered 2017-09-04: 100 mg via ORAL
  Filled 2017-09-04: qty 1

## 2017-09-04 MED ORDER — CALCIUM CARBONATE ANTACID 500 MG PO CHEW
1.0000 | CHEWABLE_TABLET | Freq: Once | ORAL | Status: AC
  Administered 2017-09-04: 200 mg via ORAL
  Filled 2017-09-04: qty 1

## 2017-09-04 MED ORDER — ACETAMINOPHEN 500 MG PO TABS
1000.0000 mg | ORAL_TABLET | Freq: Once | ORAL | Status: AC
  Administered 2017-09-04: 1000 mg via ORAL
  Filled 2017-09-04: qty 2

## 2017-09-04 MED ORDER — GI COCKTAIL ~~LOC~~
30.0000 mL | Freq: Once | ORAL | Status: AC
  Administered 2017-09-04: 30 mL via ORAL
  Filled 2017-09-04: qty 30

## 2017-09-04 MED ORDER — DOXYCYCLINE HYCLATE 100 MG PO CAPS: 100.0000 mg | ORAL_CAPSULE | Freq: Two times a day (BID) | ORAL | 0 refills | Status: DC

## 2017-09-04 MED FILL — DOXYCYCLINE HYCLATE 100 MG: 100 | 7 days supply | Qty: 14 | Fill #0

## 2017-09-04 NOTE — ED Notes (Signed)
Patient ambulated with pulse ox. SAT 94-99%

## 2017-09-04 NOTE — ED Triage Notes (Signed)
Pt reports 2 months of "cold" cough and congestion, "I've been nursing it but it won't go away". Reports cough produces yellow, green and brown sputum x 2 months, this am woke up with fever. Denies any n/v/d or other c/o.

## 2017-09-04 NOTE — ED Provider Notes (Signed)
[facility] EMERGENCY DEPARTMENT Provider Note   CSN: [id number] Arrival date & time: 09/04/17  0847     History   Chief Complaint Chief Complaint  Patient presents with  . Cough  . Fever    HPI Mandy Ortiz is a 77 y.o. female.  77 year old female with past medical history including HTN, HLD, GERD, migraines who p/w cough and fever.  The patient reports 2 months of cough and congestion associated with yellow, green, and brown sputum.  This morning, she woke up with fever.  She has had associated diffuse body pain today.  She denies any associated shortness of breath or chest pain.  No nausea, vomiting, diarrhea, urinary symptoms, sick contacts, or recent travel.  She has not tried any medications for her symptoms.  The history is provided by the patient.  Cough   Fever   Associated symptoms include cough.    Past Medical History:  Diagnosis Date  . GERD (gastroesophageal reflux disease)   . Hiatal hernia   . Hyperlipidemia   . Hypertension   . Migraine     Patient Active Problem List   Diagnosis Date Noted  . AKI (acute kidney injury) ([place]) 11/01/2015  . Positive fecal occult blood test 11/01/2015  . Nodule of left lung 10/31/2015  . Liver lesions, multiple 10/31/2015  . Pancreatic lesion 10/31/2015  . History of Bell's palsy 10/31/2015  . Traumatic hematoma of lower leg with infection 10/30/2015  . Abdominal pain 10/30/2015  . Diarrhea 10/30/2015  . Essential hypertension 10/30/2015  . GERD (gastroesophageal reflux disease) 10/30/2015  . Hypertensive urgency 05/27/2014  . Chest pain 05/27/2014  . Headache 05/27/2014  . Hyperlipidemia 05/27/2014  . Migraine without aura and with status migrainosus, not intractable     Past Surgical History:  Procedure Laterality Date  . ABDOMINAL HYSTERECTOMY    . ABDOMINAL SURGERY    . COLON SURGERY       OB History   None      Home Medications    Prior to Admission medications   Medication Sig  Start Date End Date Taking? Authorizing Provider  amLODipine (NORVASC) 10 MG tablet Take 1 tablet (10 mg total) by mouth daily. 05/30/14   Ghimire, Henreitta Leber, MD  aspirin 81 MG tablet Take 81 mg by mouth every morning.     [provider]  butalbital-acetaminophen-caffeine (FIORICET, ESGIC) [phone] MG tablet Take 1 tablet by mouth every 6 (six) hours as needed for headache. 11/03/15   Rama, Venetia Maxon, MD  CALCIUM-VITAMIN D PO Take 1 tablet by mouth every evening.    [provider]  doxycycline (VIBRAMYCIN) 100 MG capsule Take 1 capsule (100 mg total) by mouth 2 (two) times daily. 09/04/17   Alick Lecomte, Wenda Overland, MD  irbesartan (AVAPRO) 300 MG tablet Take 1 tablet (300 mg total) by mouth daily. 05/30/14   Ghimire, Henreitta Leber, MD  pantoprazole (PROTONIX) 40 MG tablet Take 1 tablet (40 mg total) by mouth daily. 05/30/14   Ghimire, Henreitta Leber, MD  ranitidine (ZANTAC) 150 MG tablet Take 1 tablet (150 mg total) by mouth 2 (two) times daily. 12/19/15   Orpah Greek, MD  simvastatin (ZOCOR) 20 MG tablet Take 1 tablet (20 mg total) by mouth at bedtime. 05/30/14   Ghimire, Henreitta Leber, MD  sucralfate (CARAFATE) 1 GM/10ML suspension Take 10 mLs (1 g total) by mouth 4 (four) times daily -  with meals and at bedtime. 12/19/15   Orpah Greek, MD  triamterene-hydrochlorothiazide Jacklynn Lewis)  50-25 MG capsule Take 1 capsule by mouth daily. 11/16/15   [provider]    Family History Family History  Problem Relation Age of Onset  . Hypertension Mother   . Breast cancer Mother   . Hypertension Father   . Hypertension Brother   . Heart attack Brother     Social History Social History   Tobacco Use  . Smoking status: Never Smoker  . Smokeless tobacco: Never Used  Substance Use Topics  . Alcohol use: No  . Drug use: No     Allergies   Sulfa antibiotics and Sulfamethoxazole   Review of Systems Review of Systems  Constitutional: Positive for fever.    Respiratory: Positive for cough.    All other systems reviewed and are negative except that which was mentioned in HPI   Physical Exam Updated Vital Signs BP 112/73 (BP Location: Right Arm)   Pulse 72   Temp (!) 100.4 F (38 C) (Oral)   Resp 18   SpO2 96%   Physical Exam  Constitutional: She is oriented to person, place, and time. She appears well-developed and well-nourished. No distress.  uncomfortable  HENT:  Head: Normocephalic and atraumatic.  dry mucous membranes  Eyes: Pupils are equal, round, and reactive to light. Conjunctivae are normal.  Neck: Neck supple.  Cardiovascular: Regular rhythm and normal heart sounds. Tachycardia present.  No murmur heard. Pulmonary/Chest: Effort normal.  Diminished b/l  Abdominal: Soft. Bowel sounds are normal. She exhibits no distension. There is no tenderness.  Musculoskeletal: She exhibits no edema.  Neurological: She is alert and oriented to person, place, and time.  Fluent speech  Skin: Skin is warm and dry. No rash noted.  Psychiatric: She has a normal mood and affect. Judgment normal.  Nursing note and vitals reviewed.    ED Treatments / Results  Labs (all labs ordered are listed, but only abnormal results are displayed) Labs Reviewed  CBC WITH DIFFERENTIAL/PLATELET - Abnormal; Notable for the following components:      Result Value   WBC 12.1 (*)    Hemoglobin 11.2 (*)    HCT 35.2 (*)    Neutro Abs 9.7 (*)    Monocytes Absolute 1.2 (*)    All other components within normal limits  URINALYSIS, ROUTINE W REFLEX MICROSCOPIC - Abnormal; Notable for the following components:   Specific Gravity, Urine <1.005 (*)    All other components within normal limits  COMPREHENSIVE METABOLIC PANEL - Abnormal; Notable for the following components:   Glucose, Bld 105 (*)    Creatinine, Ser 1.53 (*)    GFR calc non Af Amer 32 (*)    GFR calc Af Amer 37 (*)    All other components within normal limits  CULTURE, BLOOD (ROUTINE X 2)   CULTURE, BLOOD (ROUTINE X 2)  URINE CULTURE  I-STAT CG4 LACTIC ACID, ED  I-STAT CG4 LACTIC ACID, ED    EKG None  Radiology Dg Chest 2 View  Result Date: 09/04/2017 CLINICAL DATA:  Productive cough and chest congestion.  Fever. EXAM: CHEST - 2 VIEW COMPARISON:  Chest x-rays dated 10/15/2016 and 12/19/2015 and chest CT dated 10/07/2016 FINDINGS: There is a vague small area of increased density in the anterior aspect of the right upper lobe on the lateral view and in the mid zone on the PA view. There is slight scarring in the left lung base laterally. Lungs are otherwise clear. Heart size and vascularity are normal. Aortic atherosclerosis. No effusions. No bone abnormality. IMPRESSION:  1. Faint patchy areas of infiltrate in the right upper lobe. 2. Scarring at the left lung base. 3.  Aortic Atherosclerosis (ICD10-I70.0). Electronically Signed   By: Lorriane Shire M.D.   On: 09/04/2017 09:23    Procedures Procedures (including critical care time)  Medications Ordered in ED Medications  acetaminophen (TYLENOL) tablet 1,000 mg (1,000 mg Oral Given 09/04/17 0927)  doxycycline (VIBRA-TABS) tablet 100 mg (100 mg Oral Given 09/04/17 1019)  gi cocktail (Maalox,Lidocaine,Donnatal) (30 mLs Oral Given 09/04/17 1220)  calcium carbonate (TUMS - dosed in mg elemental calcium) chewable tablet 200 mg of elemental calcium (200 mg of elemental calcium Oral Given 09/04/17 1220)     Initial Impression / Assessment and Plan / ED Course  I have reviewed the triage vital signs and the nursing notes.  Pertinent labs & imaging results that were available during my care of the patient were reviewed by me and considered in my medical decision making (see chart for details).     PT non-toxic on exam, no respiratory distress, T 101.3 but remainder of VS normal. Initial lactate normal. XR w/ RUL infiltrate. Gave doxycycline to cover CAP.   Labs show WBC 12.1, negative UA, Cr 1.53 similar to previous. PT able to  ambulate with normal O2 sat. she was resting and breathing comfortably on reassessment.  I discussed outpatient course of antibiotics and instructed patient to see PCP in 2 to 3 days for reassessment.  Extensively reviewed return precautions and patient voiced understanding.  Final Clinical Impressions(s) / ED Diagnoses   Final diagnoses:  Community acquired pneumonia, unspecified laterality    ED Discharge Orders        Ordered    doxycycline (VIBRAMYCIN) 100 MG capsule  2 times daily     09/04/17 1355       Burel Kahre, Wenda Overland, MD 09/04/17 1359

## 2017-09-04 NOTE — ED Notes (Signed)
Went to ambulate patient with pulse ox. Got patient up on side of bed, but she stated she was in too much reflux pain. Would like to have reflux medication before she attempted to walk. RN aware

## 2017-09-04 NOTE — Discharge Instructions (Addendum)
Return to ER if you have any breathing problems, high fevers, chest pain, or worsening symptoms. See your primary care provider in 2-3 days.

## 2017-09-06 LAB — URINE CULTURE
Culture: 80000 — AB
Special Requests: NORMAL

## 2017-09-07 ENCOUNTER — Telehealth: Payer: Self-pay | Admitting: *Deleted

## 2017-09-07 DIAGNOSIS — I10 Essential (primary) hypertension: Secondary | ICD-10-CM | POA: Diagnosis not present

## 2017-09-07 DIAGNOSIS — N3000 Acute cystitis without hematuria: Secondary | ICD-10-CM | POA: Diagnosis not present

## 2017-09-07 DIAGNOSIS — J189 Pneumonia, unspecified organism: Secondary | ICD-10-CM | POA: Diagnosis not present

## 2017-09-07 NOTE — Progress Notes (Signed)
ED Antimicrobial Stewardship Positive Culture Follow Up   Mandy Ortiz is an 77 y.o. female who presented to [facility] on 09/04/2017 with a chief complaint of  Chief Complaint  Patient presents with  . Cough  . Fever    Recent Results (from the past 720 hour(s))  Culture, blood (routine x 2)     Status: None (Preliminary result)   Collection Time: 09/04/17  9:28 AM  Result Value Ref Range Status   Specimen Description   Final    RIGHT ANTECUBITAL Performed at [facility], [address] [place], [place] [address]    Special Requests   Final    BOTTLES DRAWN AEROBIC AND ANAEROBIC Blood Culture adequate volume Performed at [facility], [address] [place], [place] [address]    Culture   Final    NO GROWTH 2 DAYS Performed at [facility], [place] [address], [place], [place] [address]    Report Status PENDING  Incomplete  Culture, blood (routine x 2)     Status: None (Preliminary result)   Collection Time: 09/04/17  9:28 AM  Result Value Ref Range Status   Specimen Description   Final    BLOOD LEFT WRIST Performed at [facility], [address] [place], [place] [address]    Special Requests   Final    BOTTLES DRAWN AEROBIC AND ANAEROBIC Blood Culture results may not be optimal due to an inadequate volume of blood received in culture bottles Performed at [facility], [address] [place], [place] [address]    Culture   Final    NO GROWTH 2 DAYS Performed at [facility], [place] [address], [place], [place] [address]    Report Status PENDING  Incomplete  Urine culture     Status: Abnormal   Collection Time: 09/04/17  9:28 AM  Result Value Ref Range Status   Specimen Description   Final    URINE, CLEAN CATCH Performed at [facility], [address] [place],  [address]    Special Requests   Final    Normal Performed at [facility], [address] [place], [place] [address]    Culture 80,000 COLONIES/mL ESCHERICHIA COLI (A)  Final   Report Status 09/06/2017 FINAL  Final   Organism ID, Bacteria ESCHERICHIA COLI (A)  Final      Susceptibility   Escherichia coli - MIC*    AMPICILLIN <=2 SENSITIVE Sensitive     CEFAZOLIN <=4 SENSITIVE Sensitive     CEFTRIAXONE <=1 SENSITIVE Sensitive     CIPROFLOXACIN <=0.25 SENSITIVE Sensitive     GENTAMICIN <=1 SENSITIVE Sensitive     IMIPENEM <=0.25 SENSITIVE Sensitive     NITROFURANTOIN <=16 SENSITIVE Sensitive     TRIMETH/SULFA <=20 SENSITIVE Sensitive     AMPICILLIN/SULBACTAM <=2 SENSITIVE Sensitive     PIP/TAZO <=4 SENSITIVE Sensitive     Extended ESBL NEGATIVE Sensitive     * 80,000 COLONIES/mL ESCHERICHIA COLI    [x]  Treated with doxycyline, organism resistant to prescribed antimicrobial []  Patient discharged originally without antimicrobial agent and treatment is now indicated  New antibiotic prescription: cephalexin 500mg  PO BID x 7 days  ED Provider: Suella Broad, PA   [name], Rande Lawman 09/07/2017, 9:31 AM Clinical Pharmacist Monday - Friday phone -  [phone] Saturday - Sunday phone - [phone]

## 2017-09-07 NOTE — Telephone Encounter (Signed)
Post ED Visit - Positive Culture Follow-up: Successful Patient Follow-Up  Culture assessed and recommendations reviewed by:  []  Elenor Quinones, Pharm.D. []  Heide Guile, Pharm.D., BCPS AQ-ID []  Parks Neptune, Pharm.D., BCPS []  Alycia Rossetti, Pharm.D., BCPS []  [place], [place].D., BCPS, AAHIVP []  Legrand Como, Pharm.D., BCPS, AAHIVP [x]  Salome Arnt, PharmD, BCPS []  Johnnette Gourd, PharmD, BCPS []  Hughes Better, PharmD, BCPS []  Leeroy Cha, PharmD  Positive urine culture  []  Patient discharged without antimicrobial prescription and treatment is now indicated [x]  Organism is resistant to prescribed ED discharge antimicrobial []  Patient with positive blood cultures  Changes discussed with ED provider: Suella Broad, PA-C New antibiotic prescription Keflex 500mg  PO BID x 7 days Called to [facility], [place]  Contacted patient, date 09/07/2017, time [place] 09/07/2017, 12:14 PM

## 2017-09-09 LAB — CULTURE, BLOOD (ROUTINE X 2)
CULTURE: NO GROWTH
Culture: NO GROWTH
Special Requests: ADEQUATE

## 2017-09-25 DIAGNOSIS — R413 Other amnesia: Secondary | ICD-10-CM | POA: Diagnosis not present

## 2017-09-25 DIAGNOSIS — I10 Essential (primary) hypertension: Secondary | ICD-10-CM | POA: Diagnosis not present

## 2017-09-25 DIAGNOSIS — Z0001 Encounter for general adult medical examination with abnormal findings: Secondary | ICD-10-CM | POA: Diagnosis not present

## 2017-09-25 DIAGNOSIS — Z23 Encounter for immunization: Secondary | ICD-10-CM | POA: Diagnosis not present

## 2017-09-25 DIAGNOSIS — Z136 Encounter for screening for cardiovascular disorders: Secondary | ICD-10-CM | POA: Diagnosis not present

## 2017-09-25 DIAGNOSIS — Z79899 Other long term (current) drug therapy: Secondary | ICD-10-CM | POA: Diagnosis not present

## 2017-09-25 DIAGNOSIS — K220 Achalasia of cardia: Secondary | ICD-10-CM | POA: Diagnosis not present

## 2017-09-25 DIAGNOSIS — K862 Cyst of pancreas: Secondary | ICD-10-CM | POA: Diagnosis not present

## 2017-09-29 ENCOUNTER — Other Ambulatory Visit: Payer: Self-pay | Admitting: Family Medicine

## 2017-09-29 DIAGNOSIS — R911 Solitary pulmonary nodule: Secondary | ICD-10-CM

## 2017-10-20 ENCOUNTER — Other Ambulatory Visit: Payer: Medicare HMO

## 2017-10-26 ENCOUNTER — Other Ambulatory Visit: Payer: Medicare HMO

## 2017-11-03 ENCOUNTER — Ambulatory Visit
Admission: RE | Admit: 2017-11-03 | Discharge: 2017-11-03 | Disposition: A | Discharge status: Home / Self Care | Payer: Medicare HMO | Source: Ambulatory Visit | Attending: Family Medicine | Admitting: Family Medicine

## 2017-11-03 DIAGNOSIS — R911 Solitary pulmonary nodule: Secondary | ICD-10-CM | POA: Diagnosis not present

## 2017-12-01 ENCOUNTER — Ambulatory Visit
Admission: RE | Admit: 2017-12-01 | Discharge: 2017-12-01 | Disposition: A | Discharge status: Home / Self Care | Payer: Self-pay | Source: Ambulatory Visit | Attending: Family Medicine | Admitting: Family Medicine

## 2017-12-01 ENCOUNTER — Other Ambulatory Visit: Payer: Self-pay | Admitting: Family Medicine

## 2017-12-01 DIAGNOSIS — M79671 Pain in right foot: Secondary | ICD-10-CM

## 2017-12-01 DIAGNOSIS — T1490XA Injury, unspecified, initial encounter: Secondary | ICD-10-CM

## 2018-03-23 ENCOUNTER — Encounter (HOSPITAL_BASED_OUTPATIENT_CLINIC_OR_DEPARTMENT_OTHER): Payer: Self-pay | Admitting: Adult Health

## 2018-03-23 ENCOUNTER — Other Ambulatory Visit: Payer: Self-pay

## 2018-03-23 ENCOUNTER — Emergency Department (HOSPITAL_BASED_OUTPATIENT_CLINIC_OR_DEPARTMENT_OTHER): Payer: Medicare Other

## 2018-03-23 ENCOUNTER — Emergency Department (HOSPITAL_BASED_OUTPATIENT_CLINIC_OR_DEPARTMENT_OTHER)
Admission: EM | Admit: 2018-03-23 | Discharge: 2018-03-23 | Disposition: A | Discharge status: Home / Self Care | Payer: Medicare Other | Attending: Emergency Medicine | Admitting: Emergency Medicine

## 2018-03-23 DIAGNOSIS — J111 Influenza due to unidentified influenza virus with other respiratory manifestations: Secondary | ICD-10-CM | POA: Diagnosis not present

## 2018-03-23 DIAGNOSIS — Z79899 Other long term (current) drug therapy: Secondary | ICD-10-CM | POA: Diagnosis not present

## 2018-03-23 DIAGNOSIS — R531 Weakness: Secondary | ICD-10-CM | POA: Diagnosis present

## 2018-03-23 DIAGNOSIS — I10 Essential (primary) hypertension: Secondary | ICD-10-CM | POA: Diagnosis not present

## 2018-03-23 DIAGNOSIS — Z7982 Long term (current) use of aspirin: Secondary | ICD-10-CM | POA: Diagnosis not present

## 2018-03-23 DIAGNOSIS — R69 Illness, unspecified: Secondary | ICD-10-CM

## 2018-03-23 LAB — COMPREHENSIVE METABOLIC PANEL
ALT: 16 U/L (ref 0–44)
ANION GAP: 9 (ref 5–15)
AST: 23 U/L (ref 15–41)
Albumin: 3.4 g/dL — ABNORMAL LOW (ref 3.5–5.0)
Alkaline Phosphatase: 93 U/L (ref 38–126)
BUN: 28 mg/dL — ABNORMAL HIGH (ref 8–23)
CHLORIDE: 104 mmol/L (ref 98–111)
CO2: 25 mmol/L (ref 22–32)
Calcium: 8.7 mg/dL — ABNORMAL LOW (ref 8.9–10.3)
Creatinine, Ser: 1.12 mg/dL — ABNORMAL HIGH (ref 0.44–1.00)
GFR, EST AFRICAN AMERICAN: 55 mL/min — AB (ref >60)
GFR, EST NON AFRICAN AMERICAN: 47 mL/min — AB (ref >60)
Glucose, Bld: 88 mg/dL (ref 70–99)
POTASSIUM: 3.6 mmol/L (ref 3.5–5.1)
Sodium: 138 mmol/L (ref 135–145)
TOTAL PROTEIN: 6.9 g/dL (ref 6.5–8.1)
Total Bilirubin: 1.1 mg/dL (ref 0.3–1.2)

## 2018-03-23 LAB — CBC WITH DIFFERENTIAL/PLATELET
Abs Immature Granulocytes: 0.02 10*3/uL (ref 0.00–0.07)
BASOS PCT: 0 %
Basophils Absolute: 0 10*3/uL (ref 0.0–0.1)
EOS ABS: 0.1 10*3/uL (ref 0.0–0.5)
EOS PCT: 1 %
HCT: 36.3 % (ref 36.0–46.0)
Hemoglobin: 10.9 g/dL — ABNORMAL LOW (ref 12.0–15.0)
IMMATURE GRANULOCYTES: 0 %
Lymphocytes Relative: 19 %
Lymphs Abs: 1.1 10*3/uL (ref 0.7–4.0)
MCH: 26.3 pg (ref 26.0–34.0)
MCHC: 30 g/dL (ref 30.0–36.0)
MCV: 87.5 fL (ref 80.0–100.0)
MONOS PCT: 8 %
Monocytes Absolute: 0.5 10*3/uL (ref 0.1–1.0)
NRBC: 0 % (ref 0.0–0.2)
Neutro Abs: 4.2 10*3/uL (ref 1.7–7.7)
Neutrophils Relative %: 72 %
PLATELETS: 215 10*3/uL (ref 150–400)
RBC: 4.15 MIL/uL (ref 3.87–5.11)
RDW: 15.6 % — AB (ref 11.5–15.5)
WBC: 5.9 10*3/uL (ref 4.0–10.5)

## 2018-03-23 LAB — LIPASE, BLOOD: LIPASE: 24 U/L (ref 11–51)

## 2018-03-23 LAB — LACTIC ACID, PLASMA: LACTIC ACID, VENOUS: 0.7 mmol/L (ref 0.5–1.9)

## 2018-03-23 MED ORDER — OSELTAMIVIR PHOSPHATE 30 MG PO CAPS: 30.0000 mg | ORAL_CAPSULE | Freq: Two times a day (BID) | ORAL | 0 refills | Status: AC

## 2018-03-23 MED ORDER — KETOROLAC TROMETHAMINE 15 MG/ML IJ SOLN
15.0000 mg | Freq: Once | INTRAMUSCULAR | Status: AC
  Administered 2018-03-23: 15 mg via INTRAVENOUS
  Filled 2018-03-23: qty 1

## 2018-03-23 MED ORDER — ONDANSETRON HCL 4 MG/2ML IJ SOLN
4.0000 mg | Freq: Once | INTRAMUSCULAR | Status: AC
  Administered 2018-03-23: 4 mg via INTRAVENOUS
  Filled 2018-03-23: qty 2

## 2018-03-23 MED ORDER — SODIUM CHLORIDE 0.9 % IV BOLUS
1000.0000 mL | Freq: Once | INTRAVENOUS | Status: AC
  Administered 2018-03-23: 1000 mL via INTRAVENOUS

## 2018-03-23 MED ORDER — DOXYCYCLINE HYCLATE 100 MG PO CAPS: 100.0000 mg | ORAL_CAPSULE | Freq: Two times a day (BID) | ORAL | 0 refills | Status: AC

## 2018-03-23 MED ORDER — ONDANSETRON 4 MG PO TBDP: 4.0000 mg | ORAL_TABLET | Freq: Three times a day (TID) | ORAL | 0 refills | Status: DC | PRN

## 2018-03-23 NOTE — ED Triage Notes (Addendum)
PResents with onset of bodyaches, chills, rhinnorhea, dizziness, headaches, cough, sore throat and low BP that began last night. She reports that she is unable to get warm and feels constantly cold.  She was throwing up yesterday as well. Denies abdominal pain today or vomiting today. Denies diarrhea.

## 2018-03-23 NOTE — Discharge Instructions (Signed)
Make sure that you drink plenty of fluids.  Follow-up with your doctor in 2 days for repeat exam and checkup.  Take the medications as prescribed.

## 2018-03-23 NOTE — ED Notes (Signed)
Ginger Ale given for p.o. challenge

## 2018-03-23 NOTE — ED Provider Notes (Signed)
[facility] EMERGENCY DEPARTMENT Provider Note   CSN: [id number] Arrival date & time: 03/23/18  5885     History   Chief Complaint Chief Complaint  Patient presents with  . Influenza    HPI Mandy Ortiz is a 78 y.o. female.  HPI 78 year old female with past medical history as below here with generalized weakness.  Patient states that for the last 24 hours, she has had progressive worsening generalized weakness, mild headache, cough, sore throat, and nausea.  She states her symptoms initially started actually as nausea and vomiting.  She vomited once.  She then began to develop chills, and has felt very cold since last night.  She is unable to sleep.  She had associated nasal congestion and sore throat.  No recent sick contacts.  Her nausea has largely improved.  She also has a mild, generalized headache which is not atypical for her.  No focal numbness or weakness.  No neck pain or neck stiffness.  No photophobia.  She has not taken anything for her symptoms.  She has poor appetite.  No urinary symptoms.  Past Medical History:  Diagnosis Date  . GERD (gastroesophageal reflux disease)   . Hiatal hernia   . Hyperlipidemia   . Hypertension   . Migraine     Patient Active Problem List   Diagnosis Date Noted  . AKI (acute kidney injury) ([place]) 11/01/2015  . Positive fecal occult blood test 11/01/2015  . Nodule of left lung 10/31/2015  . Liver lesions, multiple 10/31/2015  . Pancreatic lesion 10/31/2015  . History of Bell's palsy 10/31/2015  . Traumatic hematoma of lower leg with infection 10/30/2015  . Abdominal pain 10/30/2015  . Diarrhea 10/30/2015  . Essential hypertension 10/30/2015  . GERD (gastroesophageal reflux disease) 10/30/2015  . Hypertensive urgency 05/27/2014  . Chest pain 05/27/2014  . Headache 05/27/2014  . Hyperlipidemia 05/27/2014  . Migraine without aura and with status migrainosus, not intractable     Past Surgical History:  Procedure  Laterality Date  . ABDOMINAL HYSTERECTOMY    . ABDOMINAL SURGERY    . COLON SURGERY       OB History   No obstetric history on file.      Home Medications    Prior to Admission medications   Medication Sig Start Date End Date Taking? Authorizing Provider  amLODipine (NORVASC) 10 MG tablet Take 1 tablet (10 mg total) by mouth daily. 05/30/14   Ghimire, Henreitta Leber, MD  aspirin 81 MG tablet Take 81 mg by mouth every morning.     [provider]  butalbital-acetaminophen-caffeine (FIORICET, ESGIC) [phone] MG tablet Take 1 tablet by mouth every 6 (six) hours as needed for headache. 11/03/15   Rama, Venetia Maxon, MD  CALCIUM-VITAMIN D PO Take 1 tablet by mouth every evening.    [provider]  doxycycline (VIBRAMYCIN) 100 MG capsule Take 1 capsule (100 mg total) by mouth 2 (two) times daily for 7 days. 03/23/18 03/30/18  Duffy Bruce, MD  irbesartan (AVAPRO) 300 MG tablet Take 1 tablet (300 mg total) by mouth daily. 05/30/14   Ghimire, Henreitta Leber, MD  ondansetron (ZOFRAN ODT) 4 MG disintegrating tablet Take 1 tablet (4 mg total) by mouth every 8 (eight) hours as needed for nausea or vomiting. 03/23/18   Duffy Bruce, MD  oseltamivir (TAMIFLU) 30 MG capsule Take 1 capsule (30 mg total) by mouth every 12 (twelve) hours for 5 days. 03/23/18 03/28/18  Duffy Bruce, MD  pantoprazole (PROTONIX) 40 MG tablet  Take 1 tablet (40 mg total) by mouth daily. 05/30/14   Ghimire, Henreitta Leber, MD  ranitidine (ZANTAC) 150 MG tablet Take 1 tablet (150 mg total) by mouth 2 (two) times daily. 12/19/15   Orpah Greek, MD  simvastatin (ZOCOR) 20 MG tablet Take 1 tablet (20 mg total) by mouth at bedtime. 05/30/14   Ghimire, Henreitta Leber, MD  sucralfate (CARAFATE) 1 GM/10ML suspension Take 10 mLs (1 g total) by mouth 4 (four) times daily -  with meals and at bedtime. 12/19/15   Orpah Greek, MD  triamterene-hydrochlorothiazide (DYAZIDE) 50-25 MG capsule Take 1 capsule by mouth daily. 11/16/15    [provider]    Family History Family History  Problem Relation Age of Onset  . Hypertension Mother   . Breast cancer Mother   . Hypertension Father   . Hypertension Brother   . Heart attack Brother     Social History Social History   Tobacco Use  . Smoking status: Never Smoker  . Smokeless tobacco: Never Used  Substance Use Topics  . Alcohol use: No  . Drug use: No     Allergies   Sulfa antibiotics and Sulfamethoxazole   Review of Systems Review of Systems  Constitutional: Positive for chills and fatigue. Negative for fever.  HENT: Positive for sore throat. Negative for congestion and rhinorrhea.   Eyes: Negative for visual disturbance.  Respiratory: Positive for cough. Negative for shortness of breath and wheezing.   Cardiovascular: Negative for chest pain and leg swelling.  Gastrointestinal: Positive for nausea and vomiting. Negative for abdominal pain and diarrhea.  Genitourinary: Negative for dysuria, flank pain, vaginal bleeding and vaginal discharge.  Musculoskeletal: Positive for myalgias. Negative for neck pain.  Skin: Negative for rash.  Allergic/Immunologic: Negative for immunocompromised state.  Neurological: Positive for weakness. Negative for syncope and headaches.  Hematological: Does not bruise/bleed easily.  All other systems reviewed and are negative.    Physical Exam Updated Vital Signs BP 123/62 (BP Location: Right Arm)   Pulse 80   Temp 98 F (36.7 C) (Oral)   Resp 20   Ht 5\' 6"  (1.676 m)   Wt 65.8 kg   SpO2 99%   BMI 23.40 kg/m   Physical Exam Vitals signs and nursing note reviewed.  Constitutional:      General: She is not in acute distress.    Appearance: She is well-developed.  HENT:     Head: Normocephalic and atraumatic.     Comments: Moderate posterior pharyngeal erythema without tonsillar swelling or exudates.  No peritonsillar asymmetry. Eyes:     Conjunctiva/sclera: Conjunctivae normal.  Neck:      Musculoskeletal: Neck supple.  Cardiovascular:     Rate and Rhythm: Normal rate and regular rhythm.     Heart sounds: Normal heart sounds. No murmur. No friction rub.  Pulmonary:     Effort: Pulmonary effort is normal. No respiratory distress.     Breath sounds: Normal breath sounds. No wheezing or rales.  Abdominal:     General: There is no distension.     Palpations: Abdomen is soft.     Tenderness: There is no abdominal tenderness.     Comments: Mild suprapubic tenderness.  No CVA tenderness.  No rebound or guarding.  Skin:    General: Skin is warm.     Capillary Refill: Capillary refill takes less than 2 seconds.  Neurological:     Mental Status: She is alert and oriented to person, place, and time.  Motor: No abnormal muscle tone.      ED Treatments / Results  Labs (all labs ordered are listed, but only abnormal results are displayed) Labs Reviewed  CBC WITH DIFFERENTIAL/PLATELET - Abnormal; Notable for the following components:      Result Value   Hemoglobin 10.9 (*)    RDW 15.6 (*)    All other components within normal limits  COMPREHENSIVE METABOLIC PANEL - Abnormal; Notable for the following components:   BUN 28 (*)    Creatinine, Ser 1.12 (*)    Calcium 8.7 (*)    Albumin 3.4 (*)    GFR calc non Af Amer 47 (*)    GFR calc Af Amer 55 (*)    All other components within normal limits  LIPASE, BLOOD  LACTIC ACID, PLASMA  URINALYSIS, ROUTINE W REFLEX MICROSCOPIC  LACTIC ACID, PLASMA    EKG None  Radiology Dg Chest 2 View  Result Date: 03/23/2018 CLINICAL DATA:  Fever, vomiting and weakness since yesterday. EXAM: CHEST - 2 VIEW COMPARISON:  03/07/2010 FINDINGS: The cardiac silhouette, mediastinal and hilar contours are within normal limits and stable. There is mild tortuosity and calcification of the thoracic aorta. Mild chronic lung changes but no acute overlying pulmonary process. No pleural effusion. The bony thorax is intact. IMPRESSION: No acute  cardiopulmonary findings.  Mild chronic lung changes. Electronically Signed   By: Marijo Sanes M.D.   On: 03/23/2018 11:03    Procedures Procedures (including critical care time)  Medications Ordered in ED Medications  sodium chloride 0.9 % bolus 1,000 mL ( Intravenous Stopped 03/23/18 1232)  ondansetron (ZOFRAN) injection 4 mg (4 mg Intravenous Given 03/23/18 1130)  ketorolac (TORADOL) 15 MG/ML injection 15 mg (15 mg Intravenous Given 03/23/18 1233)     Initial Impression / Assessment and Plan / ED Course  I have reviewed the triage vital signs and the nursing notes.  Pertinent labs & imaging results that were available during my care of the patient were reviewed by me and considered in my medical decision making (see chart for details).    78 year old female here with generalized body aches, sore throat, chills, and cough.  Symptoms began over the last 24 hours.  She has known sick contacts with grandchildren.  No shortness of breath.  No known fevers.  On arrival here, she is afebrile and hemodynamically stable.  Chest x-ray shows no pneumonia.  Her lab work is very reassuring.  No evidence suggest sepsis or systemic infection at this time.  She was given IV fluids and feels markedly improved in the ED.  Given stable vitals and reassuring labs and likely viral syndrome, will treat empirically for flu given her age.  Given her comorbidities, also give a brief course of doxycycline for her bronchitis.  Return precautions given.  I encouraged her to follow-up with her PCP in 48 hours for repeat checkup.  Encourage fluids at home.  Final Clinical Impressions(s) / ED Diagnoses   Final diagnoses:  Influenza-like illness    ED Discharge Orders         Ordered    oseltamivir (TAMIFLU) 30 MG capsule  Every 12 hours     03/23/18 1320    doxycycline (VIBRAMYCIN) 100 MG capsule  2 times daily     03/23/18 1320    ondansetron (ZOFRAN ODT) 4 MG disintegrating tablet  Every 8 hours PRN      03/23/18 1320           Duffy Bruce, MD 03/23/18  1322  

## 2018-05-22 IMAGING — CT CT CHEST W/O CM
1 series · 15 of 34 positions shown, 19 images · non-contrast
Comparison: Chest CT 04/10/2012

CLINICAL DATA: Followup pulmonary nodule.

EXAM:
CT CHEST WITHOUT CONTRAST
TECHNIQUE: Multidetector CT imaging of the chest was performed following the
standard protocol without IV contrast.

[Series 2: chest w/(date) · axial · 0.66mm/px · z∈[-291,-57]mm · 15 of 139 slices shown, 19 images]
[im 11/139  mediastinal]
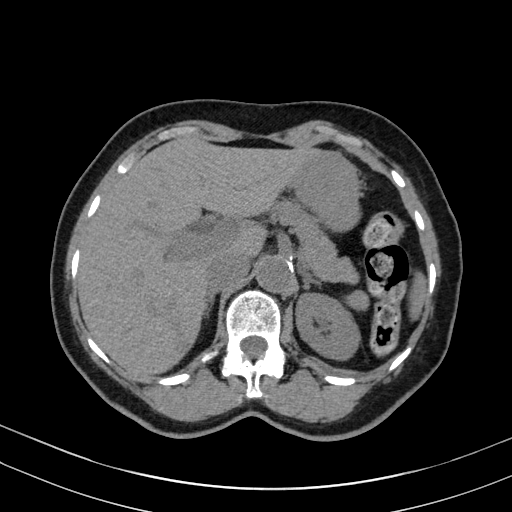
[im 11/139  lung]
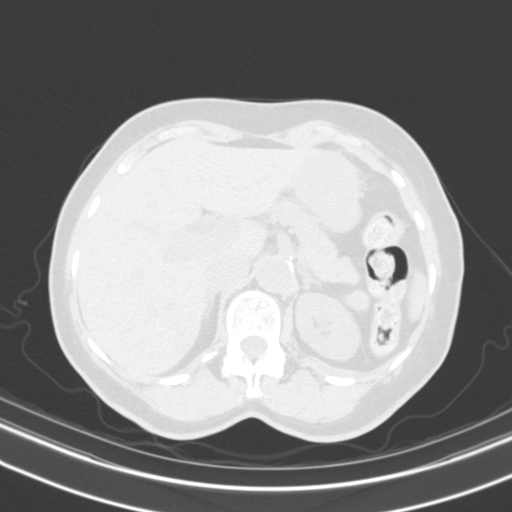
[im 21/139  lung]
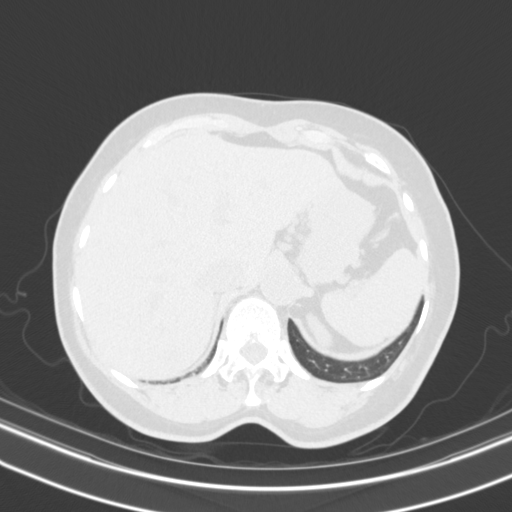
[im 28/139  lung]
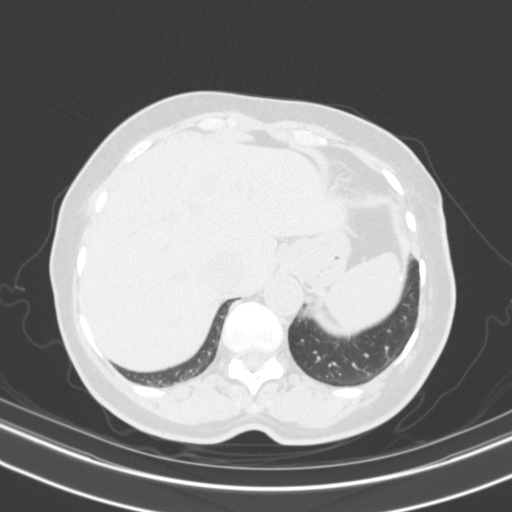
[im 36/139  lung]
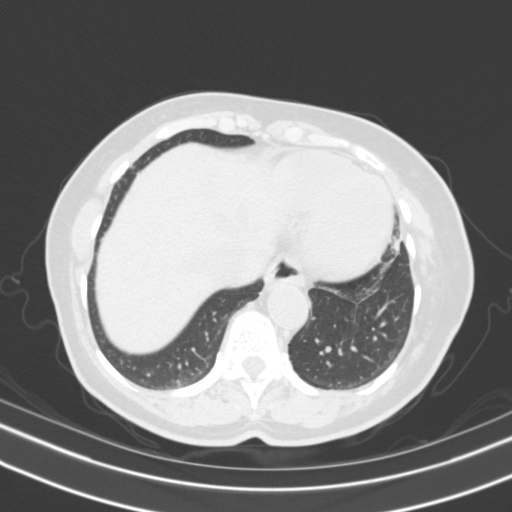
[im 47/139  mediastinal]
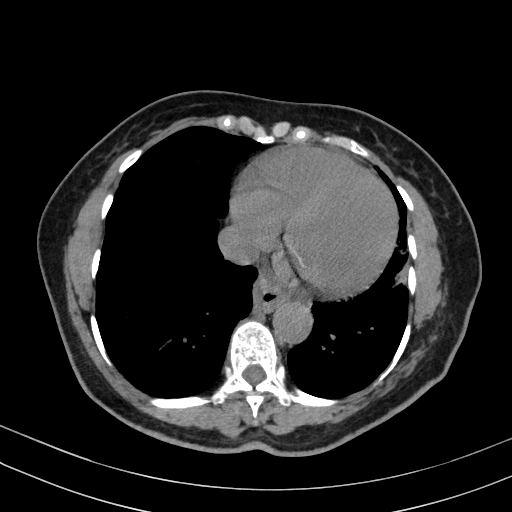
[im 47/139  lung]
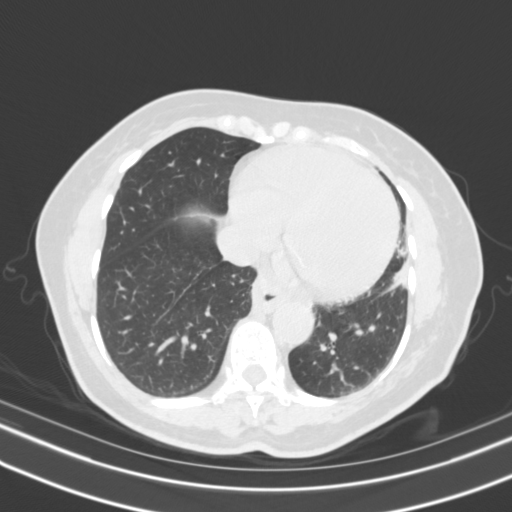
[im 56/139  lung]
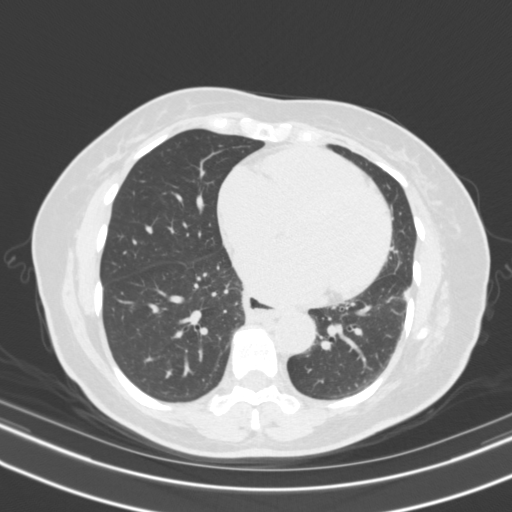
[im 62/139  lung]
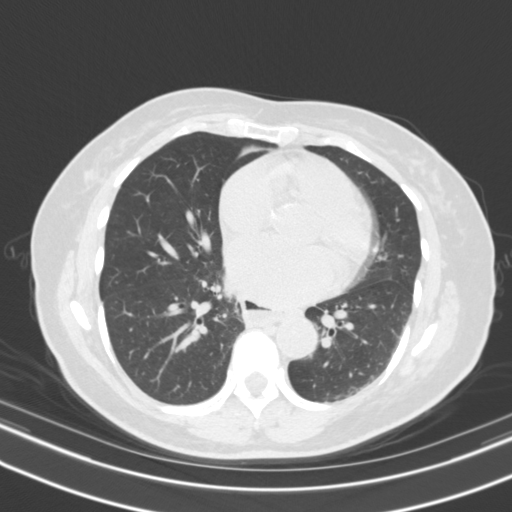
[im 72/139  lung]
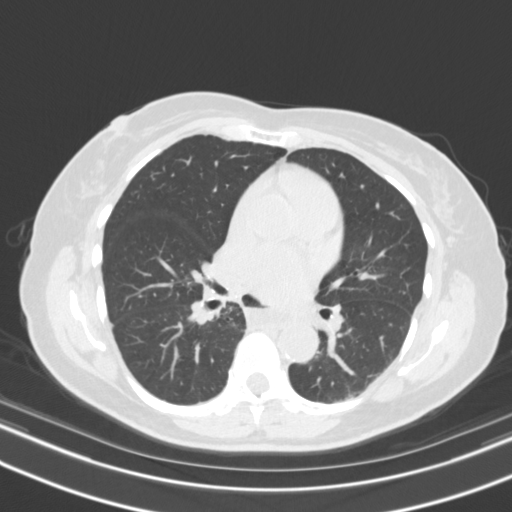
[im 77/139  mediastinal]
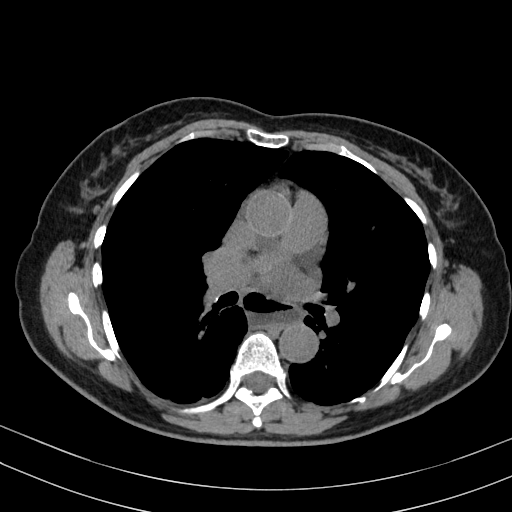
[im 77/139  lung]
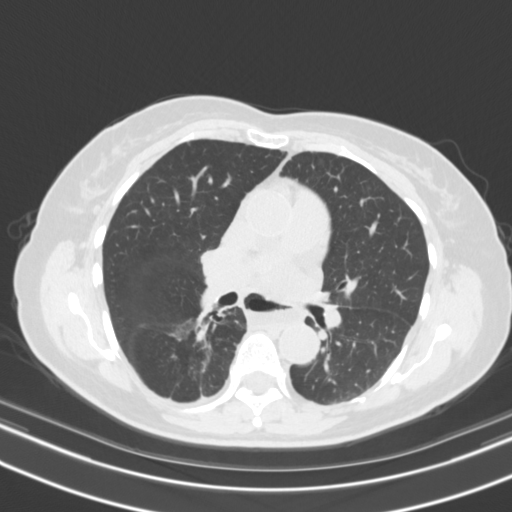
[im 83/139  lung]
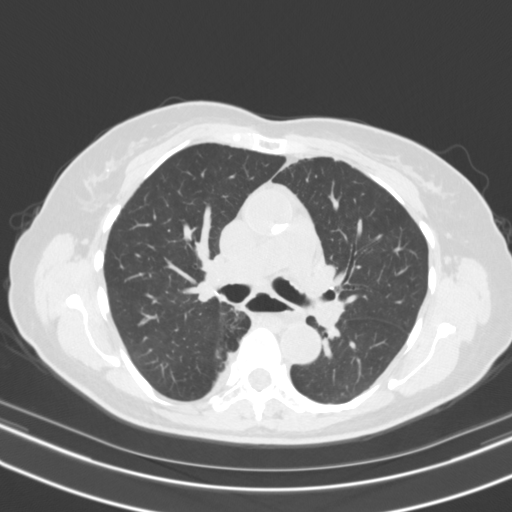
[im 93/139  lung]
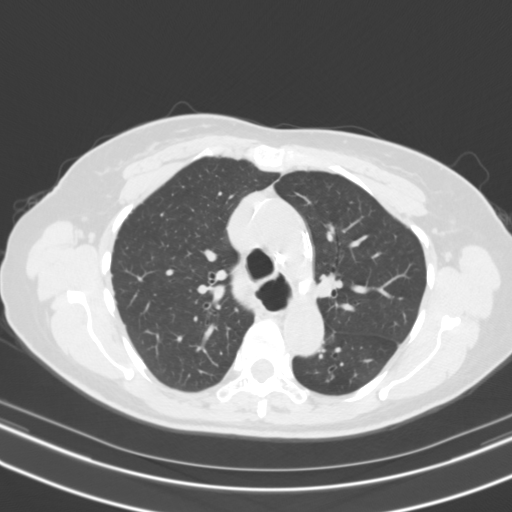
[im 103/139  lung]
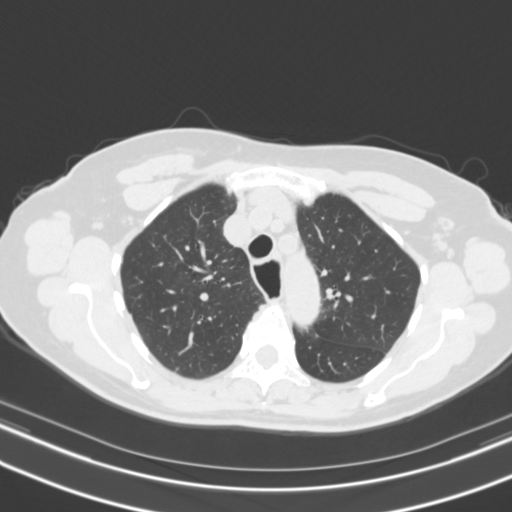
[im 111/139  mediastinal]
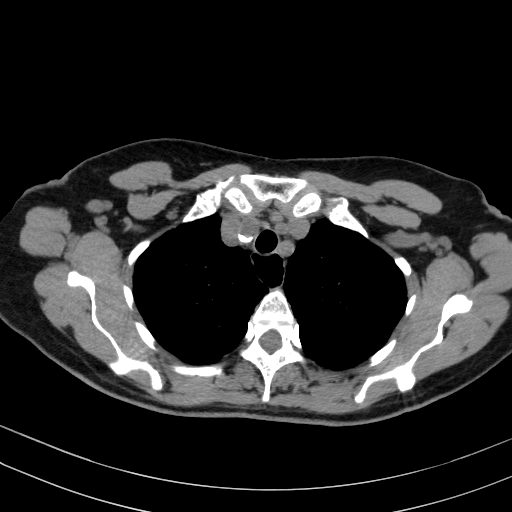
[im 111/139  lung]
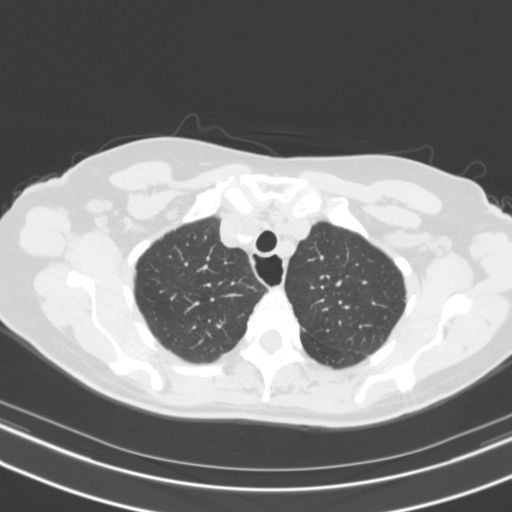
[im 118/139  lung]
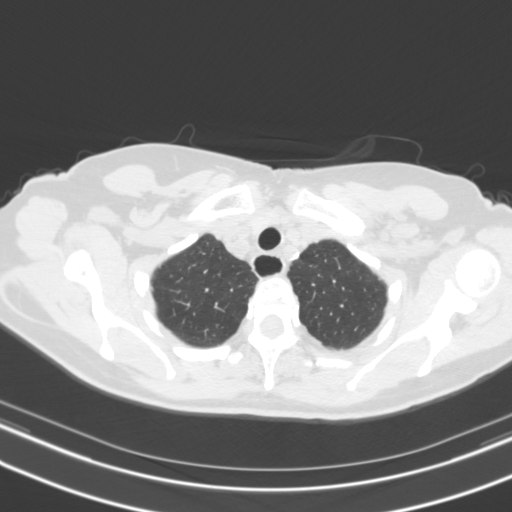
[im 128/139  lung]
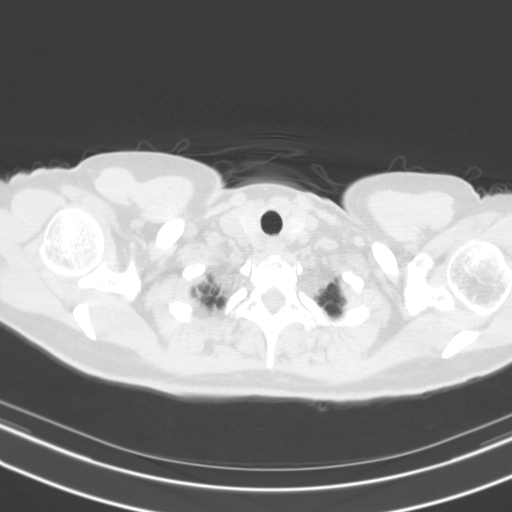

[15 of 34 positions shown; findings below may reference images not displayed]

FINDINGS: Cardiovascular: The heart is normal in size for age. No pericardial
effusion. The mild left atrial enlargement noted. The aorta is
normal in caliber. Moderate atherosclerotic calcifications involving
the aortic arch. Stable coronary artery calcifications.

Mediastinum/Nodes: No mediastinal or hilar mass for lymphadenopathy.
The esophagus is diffusely dilated with air-fluid levels. Findings
suspicious for achalasia. Recommend clinical correlation.

Lungs/Pleura: Slowly enlarging left lower lobe pulmonary nodule. It
measures 11.0 x 9.5 mm on image number 73 and on the prior study
measured 9.5 x 8.0 mm. The lesion has been present since 9998. It is
smoothly marginated and well circumscribed. Recommend continued
observation given the slow enlargement in size.

Stable surgical and possible radiation changes involving the right
lower lobe. No recurrent process is identified.

Small, 3 mm, nodule noted on image number 60 in the right lower
lobe. Not for certain seen on the prior CT scan. Stable calcified
granuloma in the right upper lobe on image number 50.

Upper Abdomen: No significant upper abdominal findings. The known
liver lesions appear stable.

Musculoskeletal: No significant bony findings.
IMPRESSION: 1. Slight interval enlargement of the smoothly marginated well
circumscribed left lower lobe pulmonary nodule now measuring 11.0 x
9.5 mm. Recommend continued surveillance. Repeat study in 12 months
is suggested.
2. Possible new 3 mm right lower lobe pulmonary nodule. Next number
stable postoperative changes and scarring changes in the right lower
lobe.
3. Markedly dilated esophagus with air-fluid level suggesting
achalasia.
4. Stable appearing liver lesions.

Aortic Atherosclerosis (CW0SX-66U.U) and Emphysema ([id number].X).

## 2018-10-09 DIAGNOSIS — Z0001 Encounter for general adult medical examination with abnormal findings: Secondary | ICD-10-CM | POA: Diagnosis not present

## 2018-10-09 DIAGNOSIS — K449 Diaphragmatic hernia without obstruction or gangrene: Secondary | ICD-10-CM | POA: Diagnosis not present

## 2018-10-09 DIAGNOSIS — Z79899 Other long term (current) drug therapy: Secondary | ICD-10-CM | POA: Diagnosis not present

## 2018-10-09 DIAGNOSIS — E2839 Other primary ovarian failure: Secondary | ICD-10-CM | POA: Diagnosis not present

## 2018-10-09 DIAGNOSIS — Z1159 Encounter for screening for other viral diseases: Secondary | ICD-10-CM | POA: Diagnosis not present

## 2018-10-09 DIAGNOSIS — E7800 Pure hypercholesterolemia, unspecified: Secondary | ICD-10-CM | POA: Diagnosis not present

## 2018-10-09 DIAGNOSIS — K219 Gastro-esophageal reflux disease without esophagitis: Secondary | ICD-10-CM | POA: Diagnosis not present

## 2018-10-09 DIAGNOSIS — I10 Essential (primary) hypertension: Secondary | ICD-10-CM | POA: Diagnosis not present

## 2018-10-09 DIAGNOSIS — R911 Solitary pulmonary nodule: Secondary | ICD-10-CM | POA: Diagnosis not present

## 2018-10-12 ENCOUNTER — Other Ambulatory Visit: Payer: Self-pay | Admitting: Family Medicine

## 2018-10-12 DIAGNOSIS — E2839 Other primary ovarian failure: Secondary | ICD-10-CM

## 2018-10-16 DIAGNOSIS — Z1159 Encounter for screening for other viral diseases: Secondary | ICD-10-CM | POA: Diagnosis not present

## 2018-10-16 DIAGNOSIS — Z79899 Other long term (current) drug therapy: Secondary | ICD-10-CM | POA: Diagnosis not present

## 2018-10-16 DIAGNOSIS — E7800 Pure hypercholesterolemia, unspecified: Secondary | ICD-10-CM | POA: Diagnosis not present

## 2018-11-13 ENCOUNTER — Other Ambulatory Visit: Payer: Self-pay | Admitting: Family Medicine

## 2018-11-13 DIAGNOSIS — R911 Solitary pulmonary nodule: Secondary | ICD-10-CM

## 2018-11-20 ENCOUNTER — Ambulatory Visit
Admission: RE | Admit: 2018-11-20 | Discharge: 2018-11-20 | Disposition: A | Discharge status: Home / Self Care | Payer: Medicare Other | Source: Ambulatory Visit | Attending: Family Medicine | Admitting: Family Medicine

## 2018-11-20 DIAGNOSIS — J984 Other disorders of lung: Secondary | ICD-10-CM | POA: Diagnosis not present

## 2018-11-20 DIAGNOSIS — I700 Atherosclerosis of aorta: Secondary | ICD-10-CM | POA: Diagnosis not present

## 2018-11-20 DIAGNOSIS — R911 Solitary pulmonary nodule: Secondary | ICD-10-CM | POA: Diagnosis not present

## 2018-12-12 ENCOUNTER — Other Ambulatory Visit: Payer: Self-pay | Admitting: Gastroenterology

## 2018-12-12 DIAGNOSIS — K869 Disease of pancreas, unspecified: Secondary | ICD-10-CM

## 2018-12-12 DIAGNOSIS — K769 Liver disease, unspecified: Secondary | ICD-10-CM

## 2018-12-31 ENCOUNTER — Ambulatory Visit
Admission: RE | Admit: 2018-12-31 | Discharge: 2018-12-31 | Disposition: A | Discharge status: Home / Self Care | Payer: Medicare HMO | Source: Ambulatory Visit | Attending: Gastroenterology | Admitting: Gastroenterology

## 2018-12-31 ENCOUNTER — Other Ambulatory Visit: Payer: Self-pay

## 2018-12-31 DIAGNOSIS — K769 Liver disease, unspecified: Secondary | ICD-10-CM | POA: Diagnosis not present

## 2018-12-31 DIAGNOSIS — K869 Disease of pancreas, unspecified: Secondary | ICD-10-CM

## 2018-12-31 MED ORDER — GADOXETATE DISODIUM 0.25 MMOL/ML IV SOLN
8.0000 mL | Freq: Once | INTRAVENOUS | Status: AC | PRN
  Administered 2018-12-31: 8 mL via INTRAVENOUS

## 2019-01-15 DIAGNOSIS — K2950 Unspecified chronic gastritis without bleeding: Secondary | ICD-10-CM | POA: Diagnosis not present

## 2019-01-15 DIAGNOSIS — Z79899 Other long term (current) drug therapy: Secondary | ICD-10-CM | POA: Diagnosis not present

## 2019-01-15 DIAGNOSIS — E7800 Pure hypercholesterolemia, unspecified: Secondary | ICD-10-CM | POA: Diagnosis not present

## 2019-01-15 DIAGNOSIS — I10 Essential (primary) hypertension: Secondary | ICD-10-CM | POA: Diagnosis not present

## 2019-01-15 DIAGNOSIS — R911 Solitary pulmonary nodule: Secondary | ICD-10-CM | POA: Diagnosis not present

## 2019-01-15 DIAGNOSIS — K862 Cyst of pancreas: Secondary | ICD-10-CM | POA: Diagnosis not present

## 2019-04-01 ENCOUNTER — Other Ambulatory Visit: Payer: Self-pay | Admitting: Gastroenterology

## 2019-04-01 DIAGNOSIS — Z1211 Encounter for screening for malignant neoplasm of colon: Secondary | ICD-10-CM

## 2019-04-05 ENCOUNTER — Other Ambulatory Visit: Payer: Self-pay | Admitting: Gastroenterology

## 2019-05-20 ENCOUNTER — Other Ambulatory Visit: Payer: Self-pay | Admitting: Gastroenterology

## 2019-05-20 DIAGNOSIS — R195 Other fecal abnormalities: Secondary | ICD-10-CM

## 2019-06-11 ENCOUNTER — Ambulatory Visit
Admission: RE | Admit: 2019-06-11 | Discharge: 2019-06-11 | Disposition: A | Discharge status: Home / Self Care | Payer: Medicare HMO | Source: Ambulatory Visit | Attending: Gastroenterology | Admitting: Gastroenterology

## 2019-06-11 DIAGNOSIS — R195 Other fecal abnormalities: Secondary | ICD-10-CM

## 2019-08-19 ENCOUNTER — Other Ambulatory Visit: Payer: Self-pay | Admitting: Family Medicine

## 2019-08-19 DIAGNOSIS — E2839 Other primary ovarian failure: Secondary | ICD-10-CM

## 2019-08-23 ENCOUNTER — Other Ambulatory Visit: Payer: Medicare Other

## 2019-11-15 ENCOUNTER — Other Ambulatory Visit: Payer: Medicare Other

## 2020-02-24 DIAGNOSIS — I10 Essential (primary) hypertension: Secondary | ICD-10-CM | POA: Diagnosis not present

## 2020-02-24 DIAGNOSIS — E7800 Pure hypercholesterolemia, unspecified: Secondary | ICD-10-CM | POA: Diagnosis not present

## 2020-02-24 DIAGNOSIS — K219 Gastro-esophageal reflux disease without esophagitis: Secondary | ICD-10-CM | POA: Diagnosis not present

## 2020-02-24 DIAGNOSIS — Z79899 Other long term (current) drug therapy: Secondary | ICD-10-CM | POA: Diagnosis not present

## 2020-02-24 DIAGNOSIS — G43009 Migraine without aura, not intractable, without status migrainosus: Secondary | ICD-10-CM | POA: Diagnosis not present

## 2020-02-24 DIAGNOSIS — I700 Atherosclerosis of aorta: Secondary | ICD-10-CM | POA: Diagnosis not present

## 2020-03-02 ENCOUNTER — Other Ambulatory Visit: Payer: Medicare Other

## 2020-05-26 DIAGNOSIS — N1831 Chronic kidney disease, stage 3a: Secondary | ICD-10-CM | POA: Diagnosis not present

## 2020-06-18 ENCOUNTER — Other Ambulatory Visit: Payer: Self-pay

## 2020-06-18 ENCOUNTER — Ambulatory Visit
Admission: RE | Admit: 2020-06-18 | Discharge: 2020-06-18 | Disposition: A | Discharge status: Home / Self Care | Payer: Medicare Other | Source: Ambulatory Visit | Attending: Family Medicine | Admitting: Family Medicine

## 2020-06-18 DIAGNOSIS — E2839 Other primary ovarian failure: Secondary | ICD-10-CM

## 2020-06-18 DIAGNOSIS — Z780 Asymptomatic menopausal state: Secondary | ICD-10-CM | POA: Diagnosis not present

## 2020-06-18 DIAGNOSIS — M8589 Other specified disorders of bone density and structure, multiple sites: Secondary | ICD-10-CM | POA: Diagnosis not present

## 2020-09-01 DIAGNOSIS — D649 Anemia, unspecified: Secondary | ICD-10-CM | POA: Diagnosis not present

## 2020-09-01 DIAGNOSIS — K449 Diaphragmatic hernia without obstruction or gangrene: Secondary | ICD-10-CM | POA: Diagnosis not present

## 2020-09-01 DIAGNOSIS — E7800 Pure hypercholesterolemia, unspecified: Secondary | ICD-10-CM | POA: Diagnosis not present

## 2020-09-01 DIAGNOSIS — N1831 Chronic kidney disease, stage 3a: Secondary | ICD-10-CM | POA: Diagnosis not present

## 2020-09-01 DIAGNOSIS — K2950 Unspecified chronic gastritis without bleeding: Secondary | ICD-10-CM | POA: Diagnosis not present

## 2020-09-01 DIAGNOSIS — I700 Atherosclerosis of aorta: Secondary | ICD-10-CM | POA: Diagnosis not present

## 2020-09-01 DIAGNOSIS — Z0001 Encounter for general adult medical examination with abnormal findings: Secondary | ICD-10-CM | POA: Diagnosis not present

## 2020-09-01 DIAGNOSIS — I10 Essential (primary) hypertension: Secondary | ICD-10-CM | POA: Diagnosis not present

## 2020-09-01 DIAGNOSIS — Z79899 Other long term (current) drug therapy: Secondary | ICD-10-CM | POA: Diagnosis not present

## 2020-12-15 DIAGNOSIS — Z79899 Other long term (current) drug therapy: Secondary | ICD-10-CM | POA: Diagnosis not present

## 2020-12-15 DIAGNOSIS — N183 Chronic kidney disease, stage 3 unspecified: Secondary | ICD-10-CM | POA: Diagnosis not present

## 2020-12-15 DIAGNOSIS — D649 Anemia, unspecified: Secondary | ICD-10-CM | POA: Diagnosis not present

## 2020-12-30 ENCOUNTER — Other Ambulatory Visit: Payer: Self-pay | Admitting: Gastroenterology

## 2020-12-31 ENCOUNTER — Other Ambulatory Visit: Payer: Self-pay | Admitting: Family Medicine

## 2020-12-31 DIAGNOSIS — K862 Cyst of pancreas: Secondary | ICD-10-CM

## 2021-02-01 DIAGNOSIS — Z23 Encounter for immunization: Secondary | ICD-10-CM | POA: Diagnosis not present

## 2021-02-10 DIAGNOSIS — E7800 Pure hypercholesterolemia, unspecified: Secondary | ICD-10-CM | POA: Diagnosis not present

## 2021-02-10 DIAGNOSIS — N183 Chronic kidney disease, stage 3 unspecified: Secondary | ICD-10-CM | POA: Diagnosis not present

## 2021-02-10 DIAGNOSIS — K219 Gastro-esophageal reflux disease without esophagitis: Secondary | ICD-10-CM | POA: Diagnosis not present

## 2021-02-10 DIAGNOSIS — G43009 Migraine without aura, not intractable, without status migrainosus: Secondary | ICD-10-CM | POA: Diagnosis not present

## 2021-02-10 DIAGNOSIS — E785 Hyperlipidemia, unspecified: Secondary | ICD-10-CM | POA: Diagnosis not present

## 2021-02-10 DIAGNOSIS — N1831 Chronic kidney disease, stage 3a: Secondary | ICD-10-CM | POA: Diagnosis not present

## 2021-02-10 DIAGNOSIS — D649 Anemia, unspecified: Secondary | ICD-10-CM | POA: Diagnosis not present

## 2021-02-10 DIAGNOSIS — M8580 Other specified disorders of bone density and structure, unspecified site: Secondary | ICD-10-CM | POA: Diagnosis not present

## 2021-02-10 DIAGNOSIS — I10 Essential (primary) hypertension: Secondary | ICD-10-CM | POA: Diagnosis not present

## 2021-04-01 ENCOUNTER — Encounter (HOSPITAL_COMMUNITY): Payer: Self-pay | Admitting: Emergency Medicine

## 2021-04-01 ENCOUNTER — Ambulatory Visit (HOSPITAL_COMMUNITY)
Admission: EM | Admit: 2021-04-01 | Discharge: 2021-04-01 | Disposition: A | Discharge status: Home / Self Care | Payer: Medicare Other | Attending: Urgent Care | Admitting: Urgent Care

## 2021-04-01 DIAGNOSIS — H6590 Unspecified nonsuppurative otitis media, unspecified ear: Secondary | ICD-10-CM

## 2021-04-01 DIAGNOSIS — H6593 Unspecified nonsuppurative otitis media, bilateral: Secondary | ICD-10-CM | POA: Diagnosis not present

## 2021-04-01 DIAGNOSIS — H6123 Impacted cerumen, bilateral: Secondary | ICD-10-CM

## 2021-04-01 DIAGNOSIS — R42 Dizziness and giddiness: Secondary | ICD-10-CM | POA: Diagnosis not present

## 2021-04-01 DIAGNOSIS — R519 Headache, unspecified: Secondary | ICD-10-CM | POA: Diagnosis not present

## 2021-04-01 MED ORDER — PREDNISONE 10 MG PO TABS: 10.0000 mg | ORAL_TABLET | Freq: Every day | ORAL | 0 refills | Status: AC

## 2021-04-01 MED ORDER — CARBAMIDE PEROXIDE 6.5 % OT SOLN: 5.0000 [drp] | Freq: Two times a day (BID) | OTIC | 0 refills | Status: DC

## 2021-04-01 MED ORDER — LORATADINE 10 MG PO TABS: 10.0000 mg | ORAL_TABLET | Freq: Every day | ORAL | 0 refills | Status: AC

## 2021-04-01 MED ORDER — FLUTICASONE PROPIONATE 50 MCG/ACT NA SUSP: 1.0000 | Freq: Every day | NASAL | 0 refills | Status: AC

## 2021-04-01 NOTE — Discharge Instructions (Addendum)
Your dizziness is coming from the fluid in your ears. There is no infection. The fluid comes from an obstruction to your eustachian tubes. We try to clear this by using nasal sprays and antihistamines. Please take your medications as prescribed.  If your symptoms worsen, please follow up with your PCP. Any worsening dizziness or headache should prompt an ER follow up.  Use the debrox drops for up to four days to help remove the remaining earwax.

## 2021-04-01 NOTE — ED Triage Notes (Signed)
Pt c/o ear pain for a week. Off balance/dizzy for about 4-5 days. Headache x 2 days.

## 2021-04-01 NOTE — ED Provider Notes (Signed)
[place]    CSN: [id number] Arrival date & time: 04/01/21  1605      History   Chief Complaint Chief Complaint  Patient presents with   Otalgia   Dizziness   Headache    HPI Mandy Ortiz is a 81 y.o. female.   Pleasant 81 year old female presents today with a 1 week history of alternating ear pressure.  She states it started on the right but then moved to the left and is now back on the right.  She states it feels like there is fluid in her ears.  She reports sinus pressure, and a slight headache in her frontal sinuses.  She states over the past 4 days it has been causing her to feel intermittently dizzy as well.  Patient does have a history of a stroke in the past but denies any strokelike symptoms.  She does have a history of Bell's palsy, but denies any recent change to facial expressions from her normal baseline.  She denies any weakness, slurred speech.  She denies any gait disturbances.  She does have a history of hypertension and monitors her blood pressure very often at home.  Her normal ranges between 120-130/60-70 at home.  Patient does report having a URI 2 to 3 weeks ago but thought that it resolved.   Otalgia Associated symptoms: headaches   Dizziness Associated symptoms: headaches   Headache Associated symptoms: dizziness and ear pain    Past Medical History:  Diagnosis Date   GERD (gastroesophageal reflux disease)    Hiatal hernia    Hyperlipidemia    Hypertension    Migraine     Patient Active Problem List   Diagnosis Date Noted   AKI (acute kidney injury) ([place]) 11/01/2015   Positive fecal occult blood test 11/01/2015   Nodule of left lung 10/31/2015   Liver lesions, multiple 10/31/2015   Pancreatic lesion 10/31/2015   History of Bell's palsy 10/31/2015   Traumatic hematoma of lower leg with infection 10/30/2015   Abdominal pain 10/30/2015   Diarrhea 10/30/2015   Essential hypertension 10/30/2015   GERD (gastroesophageal reflux  disease) 10/30/2015   Hypertensive urgency 05/27/2014   Chest pain 05/27/2014   Headache 05/27/2014   Hyperlipidemia 05/27/2014   Migraine without aura and with status migrainosus, not intractable     Past Surgical History:  Procedure Laterality Date   ABDOMINAL HYSTERECTOMY     ABDOMINAL SURGERY     COLON SURGERY      OB History   No obstetric history on file.      Home Medications    Prior to Admission medications   Medication Sig Start Date End Date Taking? Authorizing Provider  carbamide peroxide (DEBROX) 6.5 % OTIC solution Place 5 drops into the left ear 2 (two) times daily. 04/01/21  Yes Arkin Imran L, PA  fluticasone (FLONASE) 50 MCG/ACT nasal spray Place 1 spray into both nostrils daily. 04/01/21  Yes Eesha Schmaltz L, PA  loratadine (CLARITIN) 10 MG tablet Take 1 tablet (10 mg total) by mouth daily. 04/01/21  Yes Robi Dewolfe L, PA  predniSONE (DELTASONE) 10 MG tablet Take 1 tablet (10 mg total) by mouth daily with breakfast for 3 days. 04/01/21 04/04/21 Yes Tameika Heckmann L, PA  amLODipine (NORVASC) 10 MG tablet Take 1 tablet (10 mg total) by mouth daily. 05/30/14   Ghimire, Henreitta Leber, MD  aspirin 81 MG tablet Take 81 mg by mouth every morning.     [provider]  butalbital-acetaminophen-caffeine Emelda Brothers,  ESGIC) 50-325-40 MG tablet Take 1 tablet by mouth every 6 (six) hours as needed for headache. 11/03/15   Rama, Venetia Maxon, MD  CALCIUM-VITAMIN D PO Take 1 tablet by mouth every evening.    [provider]  irbesartan (AVAPRO) 300 MG tablet Take 1 tablet (300 mg total) by mouth daily. 05/30/14   Ghimire, Henreitta Leber, MD  ondansetron (ZOFRAN ODT) 4 MG disintegrating tablet Take 1 tablet (4 mg total) by mouth every 8 (eight) hours as needed for nausea or vomiting. 03/23/18   Duffy Bruce, MD  pantoprazole (PROTONIX) 40 MG tablet Take 1 tablet (40 mg total) by mouth daily. 05/30/14   Ghimire, Henreitta Leber, MD  ranitidine (ZANTAC) 150 MG tablet Take 1 tablet (150  mg total) by mouth 2 (two) times daily. 12/19/15   Orpah Greek, MD  simvastatin (ZOCOR) 20 MG tablet Take 1 tablet (20 mg total) by mouth at bedtime. 05/30/14   Ghimire, Henreitta Leber, MD  sucralfate (CARAFATE) 1 GM/10ML suspension Take 10 mLs (1 g total) by mouth 4 (four) times daily -  with meals and at bedtime. 12/19/15   Orpah Greek, MD  triamterene-hydrochlorothiazide (DYAZIDE) 50-25 MG capsule Take 1 capsule by mouth daily. 11/16/15   [provider]    Family History Family History  Problem Relation Age of Onset   Hypertension Mother    Breast cancer Mother    Hypertension Father    Hypertension Brother    Heart attack Brother     Social History Social History   Tobacco Use   Smoking status: Never   Smokeless tobacco: Never  Substance Use Topics   Alcohol use: No   Drug use: No     Allergies   Sulfa antibiotics and Sulfamethoxazole   Review of Systems Review of Systems  HENT:  Positive for ear pain.   Neurological:  Positive for dizziness and headaches.  All other systems reviewed and are negative.   Physical Exam Triage Vital Signs ED Triage Vitals  Enc Vitals Group     BP 04/01/21 1742 (!) 177/74     Pulse Rate 04/01/21 1742 74     Resp 04/01/21 1742 20     Temp 04/01/21 1742 98.3 F (36.8 C)     Temp Source 04/01/21 1742 Oral     SpO2 04/01/21 1742 98 %     Weight --      Height --      Head Circumference --      Peak Flow --      Pain Score 04/01/21 1740 8     Pain Loc --      Pain Edu? --      Excl. in [place]? --    No data found.  Updated Vital Signs BP (!) 177/74 (BP Location: Right Arm)    Pulse 74    Temp 98.3 F (36.8 C) (Oral)    Resp 20    SpO2 98%   Visual Acuity Right Eye Distance:   Left Eye Distance:   Bilateral Distance:    Right Eye Near:   Left Eye Near:    Bilateral Near:     Physical Exam Vitals and nursing note reviewed.  Constitutional:      General: She is not in acute distress.     Appearance: She is well-developed. She is obese. She is not ill-appearing or toxic-appearing.  HENT:     Head: Normocephalic and atraumatic.     Right Ear: Ear canal and  external ear normal. No laceration, drainage, swelling or tenderness. A middle ear effusion is present. There is impacted cerumen. No mastoid tenderness. Tympanic membrane is not injected, scarred, perforated, erythematous or retracted.     Left Ear: Ear canal and external ear normal. No laceration, drainage, swelling or tenderness. A middle ear effusion is present. There is impacted cerumen. No mastoid tenderness. Tympanic membrane is not injected, scarred, perforated, erythematous or retracted.     Ears:     Comments: Clear fluid posterior B TMS, L>R Cerumen impaction bilaterally, removed in office other than to inferior canal on L    Nose: Nose normal.     Mouth/Throat:     Mouth: Mucous membranes are moist.     Pharynx: No oropharyngeal exudate or posterior oropharyngeal erythema.  Eyes:     General: No visual field deficit.    Extraocular Movements: Extraocular movements intact.     Right eye: Normal extraocular motion.     Left eye: Normal extraocular motion.     Conjunctiva/sclera: Conjunctivae normal.     Pupils: Pupils are equal, round, and reactive to light.  Cardiovascular:     Rate and Rhythm: Normal rate and regular rhythm.     Heart sounds: No murmur heard. Pulmonary:     Effort: Pulmonary effort is normal. No respiratory distress.     Breath sounds: Normal breath sounds.  Abdominal:     Palpations: Abdomen is soft.     Tenderness: There is no abdominal tenderness.  Musculoskeletal:        General: No swelling.     Cervical back: Normal range of motion and neck supple. No rigidity or tenderness.  Lymphadenopathy:     Cervical: No cervical adenopathy.  Skin:    General: Skin is warm and dry.     Capillary Refill: Capillary refill takes less than 2 seconds.  Neurological:     General: No focal deficit  present.     Mental Status: She is alert and oriented to person, place, and time. Mental status is at baseline.     Cranial Nerves: Facial asymmetry present. No cranial nerve deficit or dysarthria.     Sensory: Sensation is intact. No sensory deficit.     Motor: Motor function is intact. No weakness, tremor, atrophy, abnormal muscle tone or pronator drift.     Coordination: Coordination is intact. Romberg sign negative. Coordination normal. Finger-Nose-Finger Test normal.     Gait: Gait is intact. Gait normal.     Deep Tendon Reflexes: Reflexes are normal and symmetric.     Comments: Chronic facial asymmetry. Bells palsy on R, defect to R 7th cranial nerve, otherwise intact.   Psychiatric:        Mood and Affect: Mood normal.     UC Treatments / Results  Labs (all labs ordered are listed, but only abnormal results are displayed) Labs Reviewed - No data to display  EKG   Radiology No results found.  Procedures Ear Cerumen Removal  Date/Time: 04/01/2021 6:41 PM Performed by: Chaney Malling, PA Authorized by: Chaney Malling, PA   Consent:    Consent obtained:  Verbal   Consent given by:  Patient   Risks, benefits, and alternatives were discussed: yes     Risks discussed:  Bleeding, infection, pain, TM perforation, incomplete removal and dizziness   Alternatives discussed:  No treatment Universal protocol:    Procedure explained and questions answered to patient or proxy's satisfaction: yes     Patient identity confirmed:  Verbally with patient Procedure details:    Location:  L ear and R ear   Procedure type: curette     Procedure outcomes: cerumen removed (complete on R, partial on L)   Post-procedure details:    Inspection:  Some cerumen remaining and ear canal clear (to L only)   Procedure completion:  Tolerated (including critical care time)  Medications Ordered in UC Medications - No data to display  Initial Impression / Assessment and Plan / UC Course  I have  reviewed the triage vital signs and the nursing notes.  Pertinent labs & imaging results that were available during my care of the patient were reviewed by me and considered in my medical decision making (see chart for details).  Clinical Course as of 04/01/21 1845  Thu Apr 01, 2021  1831 Recheck BP 126/71 [WC]    Clinical Course User Index [WC] Chaney Malling, [place]    Middle ear effusion - no evidence of infection. Likely secondary to recent URI. Supportive measures with claritin, flonase and short burst of low dose prednisone Dizziness - secondary to #1. No acute neurological findings Sinus headache - no sx of bacterial infection. Prednisone, flonase Cerumen impaction - removed in office with curette. Wax free on R, some wax remaining on L. Very hard and dry, will rx debrox  Final Clinical Impressions(s) / UC Diagnoses   Final diagnoses:  Fluid collection of middle ear  Dizziness  Sinus headache  Bilateral impacted cerumen     Discharge Instructions      Your dizziness is coming from the fluid in your ears. There is no infection. The fluid comes from an obstruction to your eustachian tubes. We try to clear this by using nasal sprays and antihistamines. Please take your medications as prescribed.  If your symptoms worsen, please follow up with your PCP. Any worsening dizziness or headache should prompt an ER follow up.  Use the debrox drops for up to four days to help remove the remaining earwax.    ED Prescriptions     Medication Sig Dispense Auth. Provider   fluticasone (FLONASE) 50 MCG/ACT nasal spray Place 1 spray into both nostrils daily. 16 g Driana Dazey L, PA   loratadine (CLARITIN) 10 MG tablet Take 1 tablet (10 mg total) by mouth daily. 30 tablet Tamim Skog L, PA   predniSONE (DELTASONE) 10 MG tablet Take 1 tablet (10 mg total) by mouth daily with breakfast for 3 days. 3 tablet Kennah Hehr L, PA   carbamide peroxide (DEBROX) 6.5 % OTIC solution  Place 5 drops into the left ear 2 (two) times daily. 15 mL Torrey Ballinas L, PA      PDMP not reviewed this encounter.   Chaney Malling, [place] 04/01/21 1845

## 2021-04-15 ENCOUNTER — Other Ambulatory Visit: Payer: Self-pay | Admitting: Family Medicine

## 2021-04-15 DIAGNOSIS — R519 Headache, unspecified: Secondary | ICD-10-CM

## 2021-04-16 ENCOUNTER — Ambulatory Visit
Admission: RE | Admit: 2021-04-16 | Discharge: 2021-04-16 | Disposition: A | Discharge status: Home / Self Care | Payer: Medicare Other | Source: Ambulatory Visit | Attending: Family Medicine | Admitting: Family Medicine

## 2021-04-16 ENCOUNTER — Other Ambulatory Visit: Payer: Self-pay

## 2021-04-16 DIAGNOSIS — R519 Headache, unspecified: Secondary | ICD-10-CM

## 2021-04-21 DIAGNOSIS — R519 Headache, unspecified: Secondary | ICD-10-CM | POA: Diagnosis not present

## 2021-05-18 DIAGNOSIS — I10 Essential (primary) hypertension: Secondary | ICD-10-CM | POA: Diagnosis not present

## 2021-05-18 DIAGNOSIS — E785 Hyperlipidemia, unspecified: Secondary | ICD-10-CM | POA: Diagnosis not present

## 2021-06-30 ENCOUNTER — Other Ambulatory Visit: Payer: Self-pay

## 2021-06-30 ENCOUNTER — Inpatient Hospital Stay (HOSPITAL_COMMUNITY): Payer: Medicare Other

## 2021-06-30 ENCOUNTER — Emergency Department (HOSPITAL_BASED_OUTPATIENT_CLINIC_OR_DEPARTMENT_OTHER): Payer: Medicare Other

## 2021-06-30 ENCOUNTER — Encounter (HOSPITAL_BASED_OUTPATIENT_CLINIC_OR_DEPARTMENT_OTHER): Payer: Self-pay

## 2021-06-30 ENCOUNTER — Inpatient Hospital Stay (HOSPITAL_BASED_OUTPATIENT_CLINIC_OR_DEPARTMENT_OTHER)
Admission: EM | Admit: 2021-06-30 | Discharge: 2021-07-03 | DRG: 390 | Disposition: A | Discharge status: Home / Self Care | Payer: Medicare Other | Attending: Internal Medicine | Admitting: Internal Medicine

## 2021-06-30 DIAGNOSIS — K769 Liver disease, unspecified: Secondary | ICD-10-CM | POA: Diagnosis not present

## 2021-06-30 DIAGNOSIS — Z90710 Acquired absence of both cervix and uterus: Secondary | ICD-10-CM

## 2021-06-30 DIAGNOSIS — I10 Essential (primary) hypertension: Secondary | ICD-10-CM | POA: Diagnosis present

## 2021-06-30 DIAGNOSIS — K589 Irritable bowel syndrome without diarrhea: Secondary | ICD-10-CM | POA: Diagnosis not present

## 2021-06-30 DIAGNOSIS — Z882 Allergy status to sulfonamides status: Secondary | ICD-10-CM

## 2021-06-30 DIAGNOSIS — R109 Unspecified abdominal pain: Secondary | ICD-10-CM | POA: Diagnosis not present

## 2021-06-30 DIAGNOSIS — Z4682 Encounter for fitting and adjustment of non-vascular catheter: Secondary | ICD-10-CM | POA: Diagnosis not present

## 2021-06-30 DIAGNOSIS — Z79899 Other long term (current) drug therapy: Secondary | ICD-10-CM | POA: Diagnosis not present

## 2021-06-30 DIAGNOSIS — K219 Gastro-esophageal reflux disease without esophagitis: Secondary | ICD-10-CM | POA: Diagnosis present

## 2021-06-30 DIAGNOSIS — E785 Hyperlipidemia, unspecified: Secondary | ICD-10-CM | POA: Diagnosis not present

## 2021-06-30 DIAGNOSIS — Z7982 Long term (current) use of aspirin: Secondary | ICD-10-CM

## 2021-06-30 DIAGNOSIS — R739 Hyperglycemia, unspecified: Secondary | ICD-10-CM | POA: Diagnosis not present

## 2021-06-30 DIAGNOSIS — K5669 Other partial intestinal obstruction: Secondary | ICD-10-CM | POA: Diagnosis not present

## 2021-06-30 DIAGNOSIS — G43909 Migraine, unspecified, not intractable, without status migrainosus: Secondary | ICD-10-CM | POA: Diagnosis present

## 2021-06-30 DIAGNOSIS — R42 Dizziness and giddiness: Secondary | ICD-10-CM | POA: Diagnosis present

## 2021-06-30 DIAGNOSIS — I129 Hypertensive chronic kidney disease with stage 1 through stage 4 chronic kidney disease, or unspecified chronic kidney disease: Secondary | ICD-10-CM | POA: Diagnosis not present

## 2021-06-30 DIAGNOSIS — D72829 Elevated white blood cell count, unspecified: Secondary | ICD-10-CM

## 2021-06-30 DIAGNOSIS — N179 Acute kidney failure, unspecified: Secondary | ICD-10-CM | POA: Diagnosis present

## 2021-06-30 DIAGNOSIS — K56609 Unspecified intestinal obstruction, unspecified as to partial versus complete obstruction: Secondary | ICD-10-CM | POA: Diagnosis not present

## 2021-06-30 DIAGNOSIS — E162 Hypoglycemia, unspecified: Secondary | ICD-10-CM

## 2021-06-30 DIAGNOSIS — Z8249 Family history of ischemic heart disease and other diseases of the circulatory system: Secondary | ICD-10-CM

## 2021-06-30 DIAGNOSIS — I700 Atherosclerosis of aorta: Secondary | ICD-10-CM | POA: Diagnosis not present

## 2021-06-30 DIAGNOSIS — N1831 Chronic kidney disease, stage 3a: Secondary | ICD-10-CM

## 2021-06-30 DIAGNOSIS — R519 Headache, unspecified: Secondary | ICD-10-CM

## 2021-06-30 DIAGNOSIS — K56699 Other intestinal obstruction unspecified as to partial versus complete obstruction: Secondary | ICD-10-CM | POA: Diagnosis not present

## 2021-06-30 LAB — URINALYSIS, ROUTINE W REFLEX MICROSCOPIC
Bilirubin Urine: NEGATIVE
Glucose, UA: NEGATIVE mg/dL
Hgb urine dipstick: NEGATIVE
Ketones, ur: NEGATIVE mg/dL
Leukocytes,Ua: NEGATIVE
Nitrite: NEGATIVE
Protein, ur: NEGATIVE mg/dL
Specific Gravity, Urine: 1.02 (ref 1.005–1.030)
pH: 7 (ref 5.0–8.0)

## 2021-06-30 LAB — CBC WITH DIFFERENTIAL/PLATELET
Abs Immature Granulocytes: 0.07 10*3/uL (ref 0.00–0.07)
Basophils Absolute: 0 10*3/uL (ref 0.0–0.1)
Basophils Relative: 0 %
Eosinophils Absolute: 0.1 10*3/uL (ref 0.0–0.5)
Eosinophils Relative: 1 %
HCT: 37 % (ref 36.0–46.0)
Hemoglobin: 11.8 g/dL — ABNORMAL LOW (ref 12.0–15.0)
Immature Granulocytes: 1 %
Lymphocytes Relative: 22 %
Lymphs Abs: 3.2 10*3/uL (ref 0.7–4.0)
MCH: 27.6 pg (ref 26.0–34.0)
MCHC: 31.9 g/dL (ref 30.0–36.0)
MCV: 86.4 fL (ref 80.0–100.0)
Monocytes Absolute: 0.8 10*3/uL (ref 0.1–1.0)
Monocytes Relative: 5 %
Neutro Abs: 10.3 10*3/uL — ABNORMAL HIGH (ref 1.7–7.7)
Neutrophils Relative %: 71 %
Platelets: 247 10*3/uL (ref 150–400)
RBC: 4.28 MIL/uL (ref 3.87–5.11)
RDW: 14.7 % (ref 11.5–15.5)
WBC: 14.5 10*3/uL — ABNORMAL HIGH (ref 4.0–10.5)
nRBC: 0 % (ref 0.0–0.2)

## 2021-06-30 LAB — COMPREHENSIVE METABOLIC PANEL
ALT: 20 U/L (ref 0–44)
AST: 31 U/L (ref 15–41)
Albumin: 3.8 g/dL (ref 3.5–5.0)
Alkaline Phosphatase: 128 U/L — ABNORMAL HIGH (ref 38–126)
Anion gap: 9 (ref 5–15)
BUN: 15 mg/dL (ref 8–23)
CO2: 25 mmol/L (ref 22–32)
Calcium: 9.5 mg/dL (ref 8.9–10.3)
Chloride: 105 mmol/L (ref 98–111)
Creatinine, Ser: 1.18 mg/dL — ABNORMAL HIGH (ref 0.44–1.00)
GFR, Estimated: 47 mL/min — ABNORMAL LOW (ref >60)
Glucose, Bld: 196 mg/dL — ABNORMAL HIGH (ref 70–99)
Potassium: 4.7 mmol/L (ref 3.5–5.1)
Sodium: 139 mmol/L (ref 135–145)
Total Bilirubin: 0.5 mg/dL (ref 0.3–1.2)
Total Protein: 7.6 g/dL (ref 6.5–8.1)

## 2021-06-30 LAB — HEMOGLOBIN A1C
Hgb A1c MFr Bld: 6.8 % — ABNORMAL HIGH (ref 4.8–5.6)
Mean Plasma Glucose: 148.46 mg/dL

## 2021-06-30 LAB — LIPASE, BLOOD: Lipase: 36 U/L (ref 11–51)

## 2021-06-30 MED ORDER — HYDRALAZINE HCL 20 MG/ML IJ SOLN: 10.0000 mg | Freq: Three times a day (TID) | INTRAMUSCULAR | Status: DC | PRN

## 2021-06-30 MED ORDER — ACETAMINOPHEN 650 MG RE SUPP: 325.0000 mg | RECTAL | Status: DC | PRN

## 2021-06-30 MED ORDER — IOHEXOL 300 MG/ML  SOLN
100.0000 mL | Freq: Once | INTRAMUSCULAR | Status: AC | PRN
  Administered 2021-06-30: 100 mL via INTRAVENOUS

## 2021-06-30 MED ORDER — SODIUM CHLORIDE 0.9 % IV SOLN
Freq: Once | INTRAVENOUS | Status: AC
Start: 2021-06-30 — End: 2021-06-30

## 2021-06-30 MED ORDER — FENTANYL CITRATE PF 50 MCG/ML IJ SOSY
25.0000 ug | PREFILLED_SYRINGE | INTRAMUSCULAR | Status: DC | PRN
  Administered 2021-06-30: 25 ug via INTRAVENOUS
  Filled 2021-06-30: qty 1

## 2021-06-30 MED ORDER — SODIUM CHLORIDE 0.9 % IV SOLN: Freq: Once | INTRAVENOUS | Status: AC

## 2021-06-30 MED ORDER — LIDOCAINE VISCOUS HCL 2 % MT SOLN
15.0000 mL | Freq: Once | OROMUCOSAL | Status: AC
  Administered 2021-06-30: 15 mL via OROMUCOSAL
  Filled 2021-06-30: qty 15

## 2021-06-30 MED ORDER — METOCLOPRAMIDE HCL 5 MG/ML IJ SOLN
10.0000 mg | Freq: Once | INTRAMUSCULAR | Status: AC
  Administered 2021-06-30: 10 mg via INTRAVENOUS
  Filled 2021-06-30: qty 2

## 2021-06-30 MED ORDER — PROCHLORPERAZINE EDISYLATE 10 MG/2ML IJ SOLN: 10.0000 mg | Freq: Four times a day (QID) | INTRAMUSCULAR | Status: DC | PRN

## 2021-06-30 MED ORDER — FENTANYL CITRATE PF 50 MCG/ML IJ SOSY
50.0000 ug | PREFILLED_SYRINGE | Freq: Once | INTRAMUSCULAR | Status: AC
  Administered 2021-06-30: 50 ug via INTRAVENOUS
  Filled 2021-06-30: qty 1

## 2021-06-30 MED ORDER — DIPHENHYDRAMINE HCL 50 MG/ML IJ SOLN
25.0000 mg | Freq: Once | INTRAMUSCULAR | Status: AC
  Administered 2021-06-30: 25 mg via INTRAVENOUS
  Filled 2021-06-30: qty 1

## 2021-06-30 MED ORDER — DIATRIZOATE MEGLUMINE & SODIUM 66-10 % PO SOLN
90.0000 mL | Freq: Once | ORAL | Status: AC
  Administered 2021-06-30: 90 mL via NASOGASTRIC
  Filled 2021-06-30: qty 90

## 2021-06-30 MED ORDER — BENZOCAINE 20 % MT AERO
INHALATION_SPRAY | Freq: Once | OROMUCOSAL | Status: AC
  Filled 2021-06-30: qty 57

## 2021-06-30 NOTE — Consult Note (Signed)
? ? ? ?Mandy Ortiz ?Jan 12, 1941  ?[id number].   ? ?Requesting MD: Dr. Cherylann Ratel ?Chief Complaint/Reason for Consult: SBO ? ?HPI: Mandy Ortiz is a 81 y.o. female who presented to the ED at [facility] for abdominal pain. Pain onset yesterday at 8 PM, primarily in her epigastrium and has progressively worsened. Notes associated nausea and dry heaves. Also complains of HA and dizziness. She is no longer passing flatus. Reports last BM was day before yesterday. Last BM was 5/9 and normal. Denies hx of similar symptoms in the past. CT A/P concerning for distal small bowel obstruction. WBC 14.5. Cr 1.18 (baseline 1.12). She has had prior Hysterectomy, appendectomy, and bowel resection all at one time in the 1970's. No prior hx of SBO's. We were asked to see.   ? ?PMHx: esophageal dysmotility followed by [facility], HTN, HLD, Migraines.  ? ?ROS: ?ROS ?As above ? ?Family History  ?Problem Relation Age of Onset  ? Hypertension Mother   ? Breast cancer Mother   ? Hypertension Father   ? Hypertension Brother   ? Heart attack Brother   ? ? ?Past Medical History:  ?Diagnosis Date  ? GERD (gastroesophageal reflux disease)   ? Hiatal hernia   ? Hyperlipidemia   ? Hypertension   ? Migraine   ? ? ?Past Surgical History:  ?Procedure Laterality Date  ? ABDOMINAL HYSTERECTOMY    ? ABDOMINAL SURGERY    ? APPENDECTOMY    ? COLON SURGERY    ? ? ?Social History:  reports that she has never smoked. She has never used smokeless tobacco. She reports that she does not drink alcohol and does not use drugs. ?No alcohol use, tobacco use.  ? ?Allergies:  ?Allergies  ?Allergen Reactions  ? Sulfa Antibiotics Hives and Swelling  ? Sulfamethoxazole Hives and Swelling  ? ? ?Medications Prior to Admission  ?Medication Sig Dispense Refill  ? amLODipine (NORVASC) 10 MG tablet Take 1 tablet (10 mg total) by mouth daily. 30 tablet 0  ? aspirin 81 MG tablet Take 81 mg by mouth every morning.     ? butalbital-acetaminophen-caffeine (FIORICET, ESGIC)  50-325-40 MG tablet Take 1 tablet by mouth every 6 (six) hours as needed for headache. 14 tablet 0  ? CALCIUM-VITAMIN D PO Take 1 tablet by mouth every evening.    ? carbamide peroxide (DEBROX) 6.5 % OTIC solution Place 5 drops into the left ear 2 (two) times daily. 15 mL 0  ? fluticasone (FLONASE) 50 MCG/ACT nasal spray Place 1 spray into both nostrils daily. 16 g 0  ? irbesartan (AVAPRO) 300 MG tablet Take 1 tablet (300 mg total) by mouth daily. 30 tablet 0  ? loratadine (CLARITIN) 10 MG tablet Take 1 tablet (10 mg total) by mouth daily. 30 tablet 0  ? ondansetron (ZOFRAN ODT) 4 MG disintegrating tablet Take 1 tablet (4 mg total) by mouth every 8 (eight) hours as needed for nausea or vomiting. 12 tablet 0  ? pantoprazole (PROTONIX) 40 MG tablet Take 1 tablet (40 mg total) by mouth daily. 60 tablet 0  ? ranitidine (ZANTAC) 150 MG tablet Take 1 tablet (150 mg total) by mouth 2 (two) times daily. 60 tablet 0  ? simvastatin (ZOCOR) 20 MG tablet Take 1 tablet (20 mg total) by mouth at bedtime. 30 tablet 0  ? sucralfate (CARAFATE) 1 GM/10ML suspension Take 10 mLs (1 g total) by mouth 4 (four) times daily -  with meals and at bedtime. 420 mL 0  ?  triamterene-hydrochlorothiazide (DYAZIDE) 50-25 MG capsule Take 1 capsule by mouth daily.    ? ? ? ?Physical Exam: ?Blood pressure 129/61, pulse 77, temperature 98 ?F (36.7 ?C), temperature source Oral, resp. rate 16, height '5\' 6"'$  (1.676 m), weight 64.4 kg, SpO2 100 %. ?General: pleasant female who is laying in bed in NAD ?HEENT: head is normocephalic, atraumatic.  NGT in place clamped. Sclera are noninjected, anicteric. ?Heart: regular, rate, and rhythm.  Normal s1,s2. No obvious murmurs, gallops, or rubs noted.  ?Lungs: CTAB, no wheezes, rhonchi, or rales noted.  Respiratory effort nonlabored ?Abd:  Soft, mild tenderness without peritonitis, nondistended, previous laparotomy incision noted, no hernias ?MS: no BUE/BLE edema, calves soft and nontender ?Skin: warm and dry with no  masses, lesions, or rashes ?Psych: A&Ox4 with an appropriate affect ?Neuro: cranial nerves grossly intact, normal speech, thought process intact, moves all extremities, gait not assessed ? ?Results for orders placed or performed during the hospital encounter of 06/30/21 (from the past 48 hour(s))  ?CBC with Differential     Status: Abnormal  ? Collection Time: 06/30/21  1:56 AM  ?Result Value Ref Range  ? WBC 14.5 (H) 4.0 - 10.5 K/uL  ? RBC 4.28 3.87 - 5.11 MIL/uL  ? Hemoglobin 11.8 (L) 12.0 - 15.0 g/dL  ? HCT 37.0 36.0 - 46.0 %  ? MCV 86.4 80.0 - 100.0 fL  ? MCH 27.6 26.0 - 34.0 pg  ? MCHC 31.9 30.0 - 36.0 g/dL  ? RDW 14.7 11.5 - 15.5 %  ? Platelets 247 150 - 400 K/uL  ? nRBC 0.0 0.0 - 0.2 %  ? Neutrophils Relative % 71 %  ? Neutro Abs 10.3 (H) 1.7 - 7.7 K/uL  ? Lymphocytes Relative 22 %  ? Lymphs Abs 3.2 0.7 - 4.0 K/uL  ? Monocytes Relative 5 %  ? Monocytes Absolute 0.8 0.1 - 1.0 K/uL  ? Eosinophils Relative 1 %  ? Eosinophils Absolute 0.1 0.0 - 0.5 K/uL  ? Basophils Relative 0 %  ? Basophils Absolute 0.0 0.0 - 0.1 K/uL  ? Immature Granulocytes 1 %  ? Abs Immature Granulocytes 0.07 0.00 - 0.07 K/uL  ?  Comment: Performed at [facility], [address] [place], [place] [address]  ?Comprehensive metabolic panel     Status: Abnormal  ? Collection Time: 06/30/21  1:56 AM  ?Result Value Ref Range  ? Sodium 139 135 - 145 mmol/L  ? Potassium 4.7 3.5 - 5.1 mmol/L  ? Chloride 105 98 - 111 mmol/L  ? CO2 25 22 - 32 mmol/L  ? Glucose, Bld 196 (H) 70 - 99 mg/dL  ?  Comment: Glucose reference range applies only to samples taken after fasting for at least 8 hours.  ? BUN 15 8 - 23 mg/dL  ? Creatinine, Ser 1.18 (H) 0.44 - 1.00 mg/dL  ? Calcium 9.5 8.9 - 10.3 mg/dL  ? Total Protein 7.6 6.5 - 8.1 g/dL  ? Albumin 3.8 3.5 - 5.0 g/dL  ? AST 31 15 - 41 U/L  ? ALT 20 0 - 44 U/L  ? Alkaline Phosphatase 128 (H) 38 - 126 U/L  ? Total Bilirubin 0.5 0.3 - 1.2 mg/dL  ? GFR, Estimated 47 (L) >60 mL/min  ?  Comment:  (NOTE) ?Calculated using the CKD-EPI Creatinine Equation (2021) ?  ? Anion gap 9 5 - 15  ?  Comment: Performed at [facility], [address], [place], [place] [address]  ?Lipase, blood  Status: None  ? Collection Time: 06/30/21  1:56 AM  ?Result Value Ref Range  ? Lipase 36 11 - 51 U/L  ?  Comment: Performed at [facility], [address], [place], [place] [address]  ?Urinalysis, Routine w reflex microscopic Urine, Clean Catch     Status: Abnormal  ? Collection Time: 06/30/21  2:44 AM  ?Result Value Ref Range  ? Color, Urine YELLOW YELLOW  ? APPearance HAZY (A) CLEAR  ? Specific Gravity, Urine 1.020 1.005 - 1.030  ? pH 7.0 5.0 - 8.0  ? Glucose, UA NEGATIVE NEGATIVE mg/dL  ? Hgb urine dipstick NEGATIVE NEGATIVE  ? Bilirubin Urine NEGATIVE NEGATIVE  ? Ketones, ur NEGATIVE NEGATIVE mg/dL  ? Protein, ur NEGATIVE NEGATIVE mg/dL  ? Nitrite NEGATIVE NEGATIVE  ? Leukocytes,Ua NEGATIVE NEGATIVE  ?  Comment: Microscopic not done on urines with negative protein, blood, leukocytes, nitrite, or glucose < 500 mg/dL. ?Performed at [facility], [address] [place], [place] [address] ?  ? ?CT Head Wo Contrast ? ?Result Date: 06/30/2021 ?CLINICAL DATA:  Headache, sudden, severe EXAM: CT HEAD WITHOUT CONTRAST TECHNIQUE: Contiguous axial images were obtained from the base of the skull through the vertex without intravenous contrast. RADIATION DOSE REDUCTION: This exam was performed according to the departmental dose-optimization program which includes automated exposure control, adjustment of the mA and/or kV according to patient size and/or use of iterative reconstruction technique. COMPARISON:  05/27/2014 FINDINGS: Brain: No acute intracranial abnormality. Specifically, no hemorrhage, hydrocephalus, mass lesion, acute infarction, or significant intracranial injury. Vascular: No hyperdense vessel or unexpected calcification. Skull: No acute calvarial abnormality. Sinuses/Orbits: No acute  findings Other: None IMPRESSION: No acute intracranial abnormality. Electronically Signed   By: Rolm Baptise M.D.   On: 06/30/2021 02:25  ? ?CT ABDOMEN PELVIS W CONTRAST ? ?Result Date: 06/30/2021 ?CLINICAL DATA:  Epigastric p

## 2021-06-30 NOTE — Progress Notes (Signed)
PT up to the [facility], [place] noted. SRP, RN ?

## 2021-06-30 NOTE — H&P (Signed)
?History and Physical  ? ? ?Patient: Mandy Ortiz DOB: 1940/06/16 ?DOA: 06/30/2021 ?DOS: the patient was seen and examined on 06/30/2021 ?PCP: Lujean Amel, MD  ?Patient coming from: Home ? ?Chief Complaint:  ?Chief Complaint  ?Patient presents with  ? Abdominal Pain  ? ?HPI: Mandy Ortiz is a 81 y.o. female with medical history significant of HTN, GERD, HLD. CKD 3a. Presenting with abdominal pain. Her pain started last night around 2000hrs. It was sharp and constant. It periumbilical. She was nauseous, but had no vomiting. She thought that having a BM may help, so she tried a fleet enema. However, it was ineffective. When her symptoms didn't improve, she called her family to bring her to the ED for help. She denies any other aggravating or alleviating factors.  ?  ?Review of Systems: As mentioned in the history of present illness. All other systems reviewed and are negative. ?Past Medical History:  ?Diagnosis Date  ? GERD (gastroesophageal reflux disease)   ? Hiatal hernia   ? Hyperlipidemia   ? Hypertension   ? Migraine   ? ?Past Surgical History:  ?Procedure Laterality Date  ? ABDOMINAL HYSTERECTOMY    ? ABDOMINAL SURGERY    ? APPENDECTOMY    ? COLON SURGERY    ? ?Social History:  reports that she has never smoked. She has never used smokeless tobacco. She reports that she does not drink alcohol and does not use drugs. ? ?Allergies  ?Allergen Reactions  ? Sulfa Antibiotics Hives and Swelling  ? Sulfamethoxazole Hives and Swelling  ? ? ?Family History  ?Problem Relation Age of Onset  ? Hypertension Mother   ? Breast cancer Mother   ? Hypertension Father   ? Hypertension Brother   ? Heart attack Brother   ? ? ?Prior to Admission medications   ?Medication Sig Start Date End Date Taking? Authorizing Provider  ?amLODipine (NORVASC) 10 MG tablet Take 1 tablet (10 mg total) by mouth daily. 05/30/14   Ghimire, Henreitta Leber, MD  ?aspirin 81 MG tablet Take 81 mg by mouth every morning.     [provider]  ?butalbital-acetaminophen-caffeine (FIORICET, ESGIC) [id number] MG tablet Take 1 tablet by mouth every 6 (six) hours as needed for headache. 11/03/15   Rama, Venetia Maxon, MD  ?CALCIUM-VITAMIN D PO Take 1 tablet by mouth every evening.    [provider]  ?carbamide peroxide (DEBROX) 6.5 % OTIC solution Place 5 drops into the left ear 2 (two) times daily. 04/01/21   Crain, Whitney L, PA  ?fluticasone (FLONASE) 50 MCG/ACT nasal spray Place 1 spray into both nostrils daily. 04/01/21   Crain, Whitney L, PA  ?irbesartan (AVAPRO) 300 MG tablet Take 1 tablet (300 mg total) by mouth daily. 05/30/14   Ghimire, Henreitta Leber, MD  ?loratadine (CLARITIN) 10 MG tablet Take 1 tablet (10 mg total) by mouth daily. 04/01/21   Crain, Whitney L, PA  ?ondansetron (ZOFRAN ODT) 4 MG disintegrating tablet Take 1 tablet (4 mg total) by mouth every 8 (eight) hours as needed for nausea or vomiting. 03/23/18   Duffy Bruce, MD  ?pantoprazole (PROTONIX) 40 MG tablet Take 1 tablet (40 mg total) by mouth daily. 05/30/14   Ghimire, Henreitta Leber, MD  ?ranitidine (ZANTAC) 150 MG tablet Take 1 tablet (150 mg total) by mouth 2 (two) times daily. 12/19/15   Orpah Greek, MD  ?simvastatin (ZOCOR) 20 MG tablet Take 1 tablet (20 mg total) by mouth at bedtime. 05/30/14   Ghimire, [employer]  M, MD  ?sucralfate (CARAFATE) 1 GM/10ML suspension Take 10 mLs (1 g total) by mouth 4 (four) times daily -  with meals and at bedtime. 12/19/15   Orpah Greek, MD  ?triamterene-hydrochlorothiazide (DYAZIDE) 50-25 MG capsule Take 1 capsule by mouth daily. 11/16/15   [provider]  ? ? ?Physical Exam: ?Vitals:  ? 06/30/21 0916 06/30/21 1000 06/30/21 1030 06/30/21 1100  ?BP: (!) 165/75 140/70 (!) 170/77 129/61  ?Pulse: 87 75 77   ?Resp: '15 18 16 16  '$ ?Temp:    98 ?F (36.7 ?C)  ?TempSrc:    Oral  ?SpO2: 95% 99% 100% 100%  ?Weight:      ?Height:      ? ?General: 81 y.o. female resting in bed in NAD ?Eyes: PERRL, normal sclera ?ENMT: Nares  patent w/o discharge, orophaynx clear, dentition normal, ears w/o discharge/lesions/ulcers ?Neck: Supple, trachea midline ?Cardiovascular: RRR,  +S1, S2, no m/g/r, equal pulses throughout ?Respiratory: CTABL, no w/r/r, normal WOB ?GI: BS hypoactive, ND, periumbilical TTP, no masses noted, no organomegaly noted ?MSK: No e/c/c ?Neuro: A&O x 3, no focal deficits ?Psyc: Appropriate interaction and affect, calm/cooperative ? ?Data Reviewed: ? ?Na+  139 ?Glucose  196 ?SCR  1.18 ?WBC  14.5 ?Hgb  11.8 ? ?CT ab/pelvis w/ contrast ?Numerous enhancing lesions throughout the liver. The largest of these appear stable in size when compared to prior MRI from 2020. There may be new smaller lesions throughout the liver. These were felt on prior MRI to be most compatible with adenomas. These could be further evaluated with non emergent MRI if felt clinically indicated. ? ?Dilated small bowel loops with air-fluid levels into the lower pelvis. Postoperative changes are noted in the distal small bowel. Findings are concerning for distal small bowel obstruction. Distal small bowel loops are decompressed. ? ?Assessment and Plan: ?No notes have been filed under this hospital service. ?Service: Hospitalist ?SBO ?    - admitted to inpt, tele ?    - fluids ?    - SBO protocol started; CCS onboard, appreciate assistance ?    - NGT in place, gastrograffin studies ordered; defer remainder of w/u to CCS ? ?Liver lesions as seen on CT ?    - consider MRI after SBO stabilized ? ?CKD3a ?    - at baseline; follow ? ?HTN ?    - resume home regimen when off NPO status; will have PRNs for now ? ?GERD ?    - protonix ? ?Migraines ?    - resume home regimen when off NPO status ? ?Hyperglycemia ?    - no history of DM, check A1c ? ?Leukocytosis ?    - no fever, likely reactive, follow ? ?HLD ?    - resume home regimen when off NPO status ? ? Advance Care Planning:   Code Status: FULL ? ?Consults: CCS ? ?Family Communication: None at bedside ? ?Severity of  Illness: ?The appropriate patient status for this patient is INPATIENT. Inpatient status is judged to be reasonable and necessary in order to provide the required intensity of service to ensure the patient's safety. The patient's presenting symptoms, physical exam findings, and initial radiographic and laboratory data in the context of their chronic comorbidities is felt to place them at high risk for further clinical deterioration. Furthermore, it is not anticipated that the patient will be medically stable for discharge from the hospital within 2 midnights of admission.  ? ?* I certify that at the point of admission it  is my clinical judgment that the patient will require inpatient hospital care spanning beyond 2 midnights from the point of admission due to high intensity of service, high risk for further deterioration and high frequency of surveillance required.* ? ?Author: ?Jonnie Finner, DO ?06/30/2021 1:09 PM ? ?For on call review [email or web address].  ?

## 2021-06-30 NOTE — ED Notes (Signed)
Patient transported to CT 

## 2021-06-30 NOTE — Progress Notes (Signed)
Plan of Care Note for accepted transfer ? ? ?Patient: QUINTANA CANELO MRN: [id number]   DOA: 06/30/2021 ? ?Facility requesting transfer: [facility] ED ?Requesting Provider: Dr. Florina Ou ?Reason for transfer: Small Bowel Obstruction ?Facility course:  ? ?81 year old female with past medical history of hypertension, gastroesophageal reflux disease who presents to [facility] emergency department with complaints of dizziness and abdominal pain. ? ?Upon evaluation in the emergency department CT imaging of the abdomen and pelvis revealed a distal small bowel obstruction.  Due to patient's ongoing symptoms an NG tube was placed, patient was made n.p.o. and provided with fentanyl and Reglan. ? ?Of note, patient has a history of multiple abdominal surgeries. ? ?Per my discussion with the ED provider, he will send a consultation request to the general surgery secure chat group requesting consultation upon arrival.   ? ?Plan of care: ?The patient is accepted for admission to Telemetry unit, at [facility]..  ? ? ?Author: ?Vernelle Emerald, MD ?06/30/2021 ? ?Check [email or web address] for on-call coverage. ? ?Nursing staff, Please call [place] number on Amion as soon as patient's arrival, so appropriate admitting provider can evaluate the pt. ?

## 2021-06-30 NOTE — ED Provider Notes (Signed)
? ?[facility] ?Provider Note: Georgena Spurling, MD, FACEP ? ?CSN: [id number] ?MRN: [id number] ?ARRIVAL: 06/30/21 at [place] ?ROOM: MH05/[facility]05 ? ? ?CHIEF COMPLAINT  ?Abdominal Pain ? ? ?HISTORY OF PRESENT ILLNESS  ?06/30/21 1:58 AM ?Mandy Ortiz is a 81 y.o. female who woke up yesterday morning with dizziness (vertigo) and a headache.  The headache is frontal.  The headache has been waxing and waning.  It has been associated with nausea and retching but no frank vomiting.  Throughout the day she has had the gradual onset of epigastric pain which she now rates as a 10 out of 10.  It is worse with palpation.  Her last bowel movement was yesterday. ? ? ?Past Medical History:  ?Diagnosis Date  ? GERD (gastroesophageal reflux disease)   ? Hiatal hernia   ? Hyperlipidemia   ? Hypertension   ? Migraine   ? ? ?Past Surgical History:  ?Procedure Laterality Date  ? ABDOMINAL HYSTERECTOMY    ? ABDOMINAL SURGERY    ? APPENDECTOMY    ? COLON SURGERY    ? ? ?Family History  ?Problem Relation Age of Onset  ? Hypertension Mother   ? Breast cancer Mother   ? Hypertension Father   ? Hypertension Brother   ? Heart attack Brother   ? ? ?Social History  ? ?Tobacco Use  ? Smoking status: Never  ? Smokeless tobacco: Never  ?Substance Use Topics  ? Alcohol use: No  ? Drug use: No  ? ? ?Prior to Admission medications   ?Medication Sig Start Date End Date Taking? Authorizing Provider  ?amLODipine (NORVASC) 10 MG tablet Take 1 tablet (10 mg total) by mouth daily. 05/30/14   Ghimire, Henreitta Leber, MD  ?aspirin 81 MG tablet Take 81 mg by mouth every morning.     [provider]  ?butalbital-acetaminophen-caffeine (FIORICET, ESGIC) [phone] MG tablet Take 1 tablet by mouth every 6 (six) hours as needed for headache. 11/03/15   Rama, Venetia Maxon, MD  ?CALCIUM-VITAMIN D PO Take 1 tablet by mouth every evening.    [provider]  ?carbamide peroxide (DEBROX) 6.5 % OTIC solution Place 5 drops into the left ear 2 (two) times  daily. 04/01/21   Crain, Whitney L, PA  ?fluticasone (FLONASE) 50 MCG/ACT nasal spray Place 1 spray into both nostrils daily. 04/01/21   Crain, Whitney L, PA  ?irbesartan (AVAPRO) 300 MG tablet Take 1 tablet (300 mg total) by mouth daily. 05/30/14   Ghimire, Henreitta Leber, MD  ?loratadine (CLARITIN) 10 MG tablet Take 1 tablet (10 mg total) by mouth daily. 04/01/21   Crain, Whitney L, PA  ?ondansetron (ZOFRAN ODT) 4 MG disintegrating tablet Take 1 tablet (4 mg total) by mouth every 8 (eight) hours as needed for nausea or vomiting. 03/23/18   Duffy Bruce, MD  ?pantoprazole (PROTONIX) 40 MG tablet Take 1 tablet (40 mg total) by mouth daily. 05/30/14   Ghimire, Henreitta Leber, MD  ?ranitidine (ZANTAC) 150 MG tablet Take 1 tablet (150 mg total) by mouth 2 (two) times daily. 12/19/15   Orpah Greek, MD  ?simvastatin (ZOCOR) 20 MG tablet Take 1 tablet (20 mg total) by mouth at bedtime. 05/30/14   Ghimire, Henreitta Leber, MD  ?sucralfate (CARAFATE) 1 GM/10ML suspension Take 10 mLs (1 g total) by mouth 4 (four) times daily -  with meals and at bedtime. 12/19/15   Orpah Greek, MD  ?triamterene-hydrochlorothiazide (DYAZIDE) 50-25 MG capsule Take 1 capsule by mouth daily. 11/16/15  [provider]  ? ? ?Allergies ?Sulfa antibiotics and Sulfamethoxazole ? ? ?REVIEW OF SYSTEMS  ?Negative except as noted here or in the History of Present Illness. ? ? ?PHYSICAL EXAMINATION  ?Initial Vital Signs ?Blood pressure (!) 150/77, pulse 71, temperature 98.2 ?F (36.8 ?C), temperature source Oral, resp. rate 18, height '5\' 6"'$  (1.676 m), weight 64.4 kg, SpO2 98 %. ? ?Examination ?General: Well-developed, thin female in no acute distress; appearance consistent with age of record ?HENT: normocephalic; atraumatic ?Eyes: pupils equal, round and reactive to light; extraocular muscles intact; arcus senilis bilaterally ?Neck: supple ?Heart: regular rate and rhythm ?Lungs: clear to auscultation bilaterally ?Abdomen: soft; nondistended;  epigastric tenderness; bowel sounds present ?Extremities: No deformity; full range of motion; pulses normal ?Neurologic: Awake, alert and oriented; motor function intact in all extremities and symmetric; no facial droop ?Skin: Warm and dry ?Psychiatric: Grimacing ? ? ?RESULTS  ?Summary of this visit's results, reviewed and interpreted by myself: ? ? EKG Interpretation ? ?Date/Time:    ?Ventricular Rate:    ?PR Interval:    ?QRS Duration:   ?QT Interval:    ?QTC Calculation:   ?R Axis:     ?Text Interpretation:   ?  ? ?  ? ?Laboratory Studies: ?Results for orders placed or performed during the hospital encounter of 06/30/21 (from the past 24 hour(s))  ?CBC with Differential     Status: Abnormal  ? Collection Time: 06/30/21  1:56 AM  ?Result Value Ref Range  ? WBC 14.5 (H) 4.0 - 10.5 K/uL  ? RBC 4.28 3.87 - 5.11 MIL/uL  ? Hemoglobin 11.8 (L) 12.0 - 15.0 g/dL  ? HCT 37.0 36.0 - 46.0 %  ? MCV 86.4 80.0 - 100.0 fL  ? MCH 27.6 26.0 - 34.0 pg  ? MCHC 31.9 30.0 - 36.0 g/dL  ? RDW 14.7 11.5 - 15.5 %  ? Platelets 247 150 - 400 K/uL  ? nRBC 0.0 0.0 - 0.2 %  ? Neutrophils Relative % 71 %  ? Neutro Abs 10.3 (H) 1.7 - 7.7 K/uL  ? Lymphocytes Relative 22 %  ? Lymphs Abs 3.2 0.7 - 4.0 K/uL  ? Monocytes Relative 5 %  ? Monocytes Absolute 0.8 0.1 - 1.0 K/uL  ? Eosinophils Relative 1 %  ? Eosinophils Absolute 0.1 0.0 - 0.5 K/uL  ? Basophils Relative 0 %  ? Basophils Absolute 0.0 0.0 - 0.1 K/uL  ? Immature Granulocytes 1 %  ? Abs Immature Granulocytes 0.07 0.00 - 0.07 K/uL  ?Comprehensive metabolic panel     Status: Abnormal  ? Collection Time: 06/30/21  1:56 AM  ?Result Value Ref Range  ? Sodium 139 135 - 145 mmol/L  ? Potassium 4.7 3.5 - 5.1 mmol/L  ? Chloride 105 98 - 111 mmol/L  ? CO2 25 22 - 32 mmol/L  ? Glucose, Bld 196 (H) 70 - 99 mg/dL  ? BUN 15 8 - 23 mg/dL  ? Creatinine, Ser 1.18 (H) 0.44 - 1.00 mg/dL  ? Calcium 9.5 8.9 - 10.3 mg/dL  ? Total Protein 7.6 6.5 - 8.1 g/dL  ? Albumin 3.8 3.5 - 5.0 g/dL  ? AST 31 15 - 41 U/L  ? ALT  20 0 - 44 U/L  ? Alkaline Phosphatase 128 (H) 38 - 126 U/L  ? Total Bilirubin 0.5 0.3 - 1.2 mg/dL  ? GFR, Estimated 47 (L) >60 mL/min  ? Anion gap 9 5 - 15  ?Lipase, blood     Status: None  ?  Collection Time: 06/30/21  1:56 AM  ?Result Value Ref Range  ? Lipase 36 11 - 51 U/L  ?Urinalysis, Routine w reflex microscopic Urine, Clean Catch     Status: Abnormal  ? Collection Time: 06/30/21  2:44 AM  ?Result Value Ref Range  ? Color, Urine YELLOW YELLOW  ? APPearance HAZY (A) CLEAR  ? Specific Gravity, Urine 1.020 1.005 - 1.030  ? pH 7.0 5.0 - 8.0  ? Glucose, UA NEGATIVE NEGATIVE mg/dL  ? Hgb urine dipstick NEGATIVE NEGATIVE  ? Bilirubin Urine NEGATIVE NEGATIVE  ? Ketones, ur NEGATIVE NEGATIVE mg/dL  ? Protein, ur NEGATIVE NEGATIVE mg/dL  ? Nitrite NEGATIVE NEGATIVE  ? Leukocytes,Ua NEGATIVE NEGATIVE  ? ?Imaging Studies: ?CT Head Wo Contrast ? ?Result Date: 06/30/2021 ?CLINICAL DATA:  Headache, sudden, severe EXAM: CT HEAD WITHOUT CONTRAST TECHNIQUE: Contiguous axial images were obtained from the base of the skull through the vertex without intravenous contrast. RADIATION DOSE REDUCTION: This exam was performed according to the departmental dose-optimization program which includes automated exposure control, adjustment of the mA and/or kV according to patient size and/or use of iterative reconstruction technique. COMPARISON:  05/27/2014 FINDINGS: Brain: No acute intracranial abnormality. Specifically, no hemorrhage, hydrocephalus, mass lesion, acute infarction, or significant intracranial injury. Vascular: No hyperdense vessel or unexpected calcification. Skull: No acute calvarial abnormality. Sinuses/Orbits: No acute findings Other: None IMPRESSION: No acute intracranial abnormality. Electronically Signed   By: Rolm Baptise M.D.   On: 06/30/2021 02:25  ? ?CT ABDOMEN PELVIS W CONTRAST ? ?Result Date: 06/30/2021 ?CLINICAL DATA:  Epigastric pain EXAM: CT ABDOMEN AND PELVIS WITH CONTRAST TECHNIQUE: Multidetector CT imaging of  the abdomen and pelvis was performed using the standard protocol following bolus administration of intravenous contrast. RADIATION DOSE REDUCTION: This exam was performed according to the departmental dose-opti

## 2021-06-30 NOTE — ED Notes (Signed)
Daughter, Clyde Canterbury, called and notified of patients [facility] inpatient room number.  ?

## 2021-06-30 NOTE — ED Triage Notes (Addendum)
Pt reports generalized abd pain, dizziness, nausea and dry heaving that started last night around 8pm. She reports she woke up yesterday morning with headache. LBM yesterday. ?

## 2021-07-01 LAB — COMPREHENSIVE METABOLIC PANEL
ALT: 20 U/L (ref 0–44)
AST: 27 U/L (ref 15–41)
Albumin: 3.4 g/dL — ABNORMAL LOW (ref 3.5–5.0)
Alkaline Phosphatase: 108 U/L (ref 38–126)
Anion gap: 7 (ref 5–15)
BUN: 17 mg/dL (ref 8–23)
CO2: 26 mmol/L (ref 22–32)
Calcium: 9 mg/dL (ref 8.9–10.3)
Chloride: 112 mmol/L — ABNORMAL HIGH (ref 98–111)
Creatinine, Ser: 1.18 mg/dL — ABNORMAL HIGH (ref 0.44–1.00)
GFR, Estimated: 47 mL/min — ABNORMAL LOW (ref >60)
Glucose, Bld: 110 mg/dL — ABNORMAL HIGH (ref 70–99)
Potassium: 3.7 mmol/L (ref 3.5–5.1)
Sodium: 145 mmol/L (ref 135–145)
Total Bilirubin: 1.2 mg/dL (ref 0.3–1.2)
Total Protein: 6.6 g/dL (ref 6.5–8.1)

## 2021-07-01 LAB — BASIC METABOLIC PANEL
Anion gap: 8 (ref 5–15)
BUN: 17 mg/dL (ref 8–23)
CO2: 25 mmol/L (ref 22–32)
Calcium: 9.1 mg/dL (ref 8.9–10.3)
Chloride: 109 mmol/L (ref 98–111)
Creatinine, Ser: 1.15 mg/dL — ABNORMAL HIGH (ref 0.44–1.00)
GFR, Estimated: 48 mL/min — ABNORMAL LOW (ref >60)
Glucose, Bld: 157 mg/dL — ABNORMAL HIGH (ref 70–99)
Potassium: 3.4 mmol/L — ABNORMAL LOW (ref 3.5–5.1)
Sodium: 142 mmol/L (ref 135–145)

## 2021-07-01 LAB — CBC
HCT: 32.7 % — ABNORMAL LOW (ref 36.0–46.0)
Hemoglobin: 9.8 g/dL — ABNORMAL LOW (ref 12.0–15.0)
MCH: 26.6 pg (ref 26.0–34.0)
MCHC: 30 g/dL (ref 30.0–36.0)
MCV: 88.9 fL (ref 80.0–100.0)
Platelets: 209 10*3/uL (ref 150–400)
RBC: 3.68 MIL/uL — ABNORMAL LOW (ref 3.87–5.11)
RDW: 14.9 % (ref 11.5–15.5)
WBC: 7.3 10*3/uL (ref 4.0–10.5)
nRBC: 0 % (ref 0.0–0.2)

## 2021-07-01 NOTE — Progress Notes (Signed)
[facility] ?Progress Note ? ?   ?Subjective: ?CC:  ?Patient reports ongoing mild intermittent abdominal pain.  She denies flatus.  She has had 2-3 liquid stools since yesterday.  She is up mobilizing in her room. ? ?Objective: ?Vital signs in last 24 hours: ?Temp:  [97.6 ?F (36.4 ?C)-98.9 ?F (37.2 ?C)] 97.6 ?F (36.4 ?C) (05/11 1018) ?Pulse Rate:  [62-86] 74 (05/11 1018) ?Resp:  [16-19] 18 (05/11 1018) ?BP: (121-174)/(52-81) 147/73 (05/11 1018) ?SpO2:  [97 %-100 %] 98 % (05/11 1018) ?  ? ?Intake/Output from previous day: ?05/10 0701 - 05/11 0700 ?In: 133.8 [I.V.:133.8] ?Out: 900 [Emesis/NG output:900] ?Intake/Output this shift: ?Total I/O ?In: 240 [P.O.:240] ?Out: 100 [Emesis/NG output:100] ? ?PE: ?Gen:  Alert, NAD, pleasant ?Card:  Regular rate and rhythm, pedal pulses 2+ BL ?Pulm:  Normal effort, clear to auscultation bilaterally ?Abd: Soft, there is mild lower abdominal tenderness without rebound or guarding, no peritonitis, no hernias appreciated, previous laparotomy scar appears well-healing ?Skin: warm and dry, no rashes  ?Psych: A&Ox3  ? ?Lab Results:  ?Recent Labs  ?  06/30/21 ?0156 07/01/21 ?6712  ?WBC 14.5* 7.3  ?HGB 11.8* 9.8*  ?HCT 37.0 32.7*  ?PLT 247 209  ? ?BMET ?Recent Labs  ?  06/30/21 ?0156 07/01/21 ?0355  ?NA 139 145  ?K 4.7 3.7  ?CL 105 112*  ?CO2 25 26  ?GLUCOSE 196* 110*  ?BUN 15 17  ?CREATININE 1.18* 1.18*  ?CALCIUM 9.5 9.0  ? ?PT/INR ?No results for input(s): LABPROT, INR in the last 72 hours. ?CMP  ?   ?Component Value Date/Time  ? NA 145 07/01/2021 0355  ? K 3.7 07/01/2021 0355  ? CL 112 (H) 07/01/2021 0355  ? CO2 26 07/01/2021 0355  ? GLUCOSE 110 (H) 07/01/2021 0355  ? BUN 17 07/01/2021 0355  ? CREATININE 1.18 (H) 07/01/2021 0355  ? CALCIUM 9.0 07/01/2021 0355  ? PROT 6.6 07/01/2021 0355  ? ALBUMIN 3.4 (L) 07/01/2021 0355  ? AST 27 07/01/2021 0355  ? ALT 20 07/01/2021 0355  ? ALKPHOS 108 07/01/2021 0355  ? BILITOT 1.2 07/01/2021 0355  ? GFRNONAA 47 (L) 07/01/2021 0355  ? GFRAA  55 (L) 03/23/2018 1125  ? ?Lipase  ?   ?Component Value Date/Time  ? LIPASE 36 06/30/2021 0156  ? ? ? ? ? ?Studies/Results: ?CT Head Wo Contrast ? ?Result Date: 06/30/2021 ?CLINICAL DATA:  Headache, sudden, severe EXAM: CT HEAD WITHOUT CONTRAST TECHNIQUE: Contiguous axial images were obtained from the base of the skull through the vertex without intravenous contrast. RADIATION DOSE REDUCTION: This exam was performed according to the departmental dose-optimization program which includes automated exposure control, adjustment of the mA and/or kV according to patient size and/or use of iterative reconstruction technique. COMPARISON:  05/27/2014 FINDINGS: Brain: No acute intracranial abnormality. Specifically, no hemorrhage, hydrocephalus, mass lesion, acute infarction, or significant intracranial injury. Vascular: No hyperdense vessel or unexpected calcification. Skull: No acute calvarial abnormality. Sinuses/Orbits: No acute findings Other: None IMPRESSION: No acute intracranial abnormality. Electronically Signed   By: Rolm Baptise M.D.   On: 06/30/2021 02:25  ? ?CT ABDOMEN PELVIS W CONTRAST ? ?Result Date: 06/30/2021 ?CLINICAL DATA:  Epigastric pain EXAM: CT ABDOMEN AND PELVIS WITH CONTRAST TECHNIQUE: Multidetector CT imaging of the abdomen and pelvis was performed using the standard protocol following bolus administration of intravenous contrast. RADIATION DOSE REDUCTION: This exam was performed according to the departmental dose-optimization program which includes automated exposure control, adjustment of the mA and/or kV according to patient size  and/or use of iterative reconstruction technique. CONTRAST:  176m OMNIPAQUE IOHEXOL 300 MG/ML  SOLN COMPARISON:  06/11/2019.  MRI 12/31/2018 FINDINGS: Lower chest: No acute findings Hepatobiliary: Multiple enhancing lesions are seen throughout the liver. Largest in the left hepatic lobe measures 3.2 cm on image 1 of series 2. Largest in the right hepatic lobe measures 2.7  cm on image 14 of series 2. These enhancing areas were thought on prior MRI tube most likely reflect numerous adenomas. These appear similar in size although there may be several new small enhancing lesions on today's study. Gallbladder unremarkable. Pancreas: No focal abnormality or ductal dilatation. Spleen: No focal abnormality.  Normal size. Adrenals/Urinary Tract: Bilateral renal cysts, the largest in the left midpole measuring 2.8 cm. Adrenal glands unremarkable. No hydronephrosis. Urinary bladder unremarkable. Stomach/Bowel: Postoperative changes within distal small bowel. Small bowel is dilated air-fluid levels. Findings concerning for distal small bowel obstruction. Large bowel decompressed, grossly unremarkable. Stomach unremarkable. Vascular/Lymphatic: Aortic atherosclerosis. No evidence of aneurysm or adenopathy. Reproductive: Prior hysterectomy.  No adnexal masses. Other: No free fluid or free air. Musculoskeletal: No acute bony abnormality IMPRESSION: Numerous enhancing lesions throughout the liver. The largest of these appear stable in size when compared to prior MRI from 2020. There may be new smaller lesions throughout the liver. These were felt on prior MRI to be most compatible with adenomas. These could be further evaluated with non emergent MRI if felt clinically indicated. Dilated small bowel loops with air-fluid levels into the lower pelvis. Postoperative changes are noted in the distal small bowel. Findings are concerning for distal small bowel obstruction. Distal small bowel loops are decompressed. Aortic atherosclerosis. Electronically Signed   By: KRolm BaptiseM.D.   On: 06/30/2021 03:29  ? ?DG Abd Portable 1V-Small Bowel Protocol-Position Verification ? ?Result Date: 06/30/2021 ?CLINICAL DATA:  NG tube placement EXAM: PORTABLE ABDOMEN - 1 VIEW COMPARISON:  None Available. FINDINGS: 1226 hours. NG tube tip is in the distal stomach near the pylorus. Visualized upper abdomen shows nonspecific  bowel gas pattern. Asymmetric elevation right hemidiaphragm noted. IMPRESSION: NG tube tip is in the distal stomach near the pylorus. Electronically Signed   By: EMisty StanleyM.D.   On: 06/30/2021 13:11  ? ?DG Abd Portable 1V-Small Bowel Obstruction Protocol-initial, 8 hr delay ? ?Result Date: 06/30/2021 ?CLINICAL DATA:  Small bowel obstruction, 8 hour delayed film. EXAM: PORTABLE ABDOMEN - 1 VIEW COMPARISON:  CT earlier today. FINDINGS: Administered enteric contrast is seen throughout the entire colon. There is no definite gaseous small bowel distension on CT. Tip and side port of the enteric tube below the diaphragm in the stomach. IMPRESSION: Administered enteric contrast throughout the entire colon. Electronically Signed   By: MKeith RakeM.D.   On: 06/30/2021 23:34   ? ?Anti-infectives: ?Anti-infectives (From admission, onward)  ? ? None  ? ?  ? ? ?Assessment/Plan ? SBO ?- CT w/ dilated small bowel loops with air-fluid levels into the lower pelvis with distal small bowel loops are decompressed.  ?-SBO protocol 5/10.  KUB with progression of enteric contrast to the colon.  Patient is having multiple bowel movements.  900 cc documented from NG tube in the last 24 hours.  Patient denies flatus.  NG tube clamped this morning.  If tolerates clamping trial will plan to remove NG tube this afternoon.  Allow sips from the floor. ?-No emergent surgical needs.  Hopefully she will continue to improve with nonoperative management in the next 24 hours ? ?  ?FEN -  NPO, NG clamped, IVF per [facility] ?VTE - SCDs, okay for chemical prophylaxis from a general surgery standpoint ?ID - None  ? LOS: 1 day  ? ?I reviewed nursing notes, hospitalist notes, last 24 h vitals and pain scores, last 48 h intake and output, last 24 h labs and trends, and last 24 h imaging results. ? ? ?Obie Dredge, PA-C ?[facility] ?Please see Amion for pager number during day hours 7:00am-4:30pm ? ? ? ? ? ? ?

## 2021-07-01 NOTE — Plan of Care (Signed)
  Problem: Clinical Measurements: Goal: Ability to maintain clinical measurements within normal limits will improve Outcome: Progressing Goal: Diagnostic test results will improve Outcome: Progressing Goal: Respiratory complications will improve Outcome: Progressing   Problem: Safety: Goal: Ability to remain free from injury will improve Outcome: Progressing   

## 2021-07-01 NOTE — Progress Notes (Signed)
?PROGRESS NOTE ? ? ? ?Mandy Ortiz  YBO:[id number] DOB: [birth year] DOA: 06/30/2021 ?PCP: Lujean Amel, MD  ? ? ? ?Brief Narrative:  ? ?HTN, GERD, HLD. CKD 3a. Presenting with abdominal pain, found to have small bowel obstruction, NG placed ? ?Subjective: ? ?She was seen in the morning, she is talking on the phone, does not appear in acute distress ?NG tube on suction ? ?Assessment & Plan: ? Principal Problem: ?  Small bowel obstruction ([place]) ?Active Problems: ?  Hyperlipidemia ?  Essential hypertension ?  GERD (gastroesophageal reflux disease) ?  Liver lesions, multiple ?  AKI (acute kidney injury) ([place]) ?  Stage 3a chronic kidney disease (CKD) ([place]) ?  Migraines ?  Leukocytosis ?  Hypoglycemia ? ? ? ?Assessment and Plan: ? ?SBO ?-ng placed in the ED ?-Appreciate gen surg input ? ?Leukocytosis ?    - no fever, likely reactive, follow ? ?Liver lesions as seen on CT ?    - consider MRI after SBO stabilized ?  ?CKD3a ?    - at baseline; follow ?  ?HTN ?    - resume home regimen when off NPO status; will have PRNs for now ? ? Hyperglycemia ?    - no history of DM, A1c 6.8 ? ?  ?HLD ?    - resume home regimen when off NPO status ? ?GERD ?    - protonix ?  ?Migraines ?    - resume home regimen when off NPO status ?  ? ? ?I have Reviewed nursing notes, Vitals, pain scores, I/o's, Lab results and  imaging results since pt's last encounter, details please see discussion above  ?I ordered the following labs:  ?Unresulted Labs (From admission, onward)  ? ?  Start     Ordered  ? 07/02/21 0500  CBC with Differential/Platelet  Tomorrow morning,   R       ? 07/01/21 1728  ? 07/02/21 0500  Magnesium  Tomorrow morning,   R       ? 07/01/21 1728  ? 07/01/21 1729  C Difficile Quick Screen w PCR reflex  (C Difficile quick screen w PCR reflex panel )  Once, for 24 hours,   TIMED       ?References:    CDiff Information Tool  ? 07/01/21 1728  ? 07/01/21 1729  Norovirus group 1 & 2 by PCR, stool  (Norovirus group 1 & 2 by PCR, stool  panel)  Once,   R       ? 07/01/21 1728  ? 07/01/21 2778  Basic metabolic panel  Once,   R       ? 07/01/21 1728  ? ?  ?  ? ?  ? ? ? ?DVT prophylaxis: SCDs Start: 06/30/21 1458 ? ? ?Code Status:   Code Status: Full Code ? ?Family Communication: none at bedside  ?Disposition:  ? ?Status is: Inpatient ? ?Dispo: The patient is from: home ?             Anticipated d/c is to: home ?             Anticipated d/c date is: TBD, need general surgery clearance ? ?Antimicrobials:   ? ?Anti-infectives (From admission, onward)  ? ? None  ? ?  ? ? ? ? ? ?Objective: ?Vitals:  ? 06/30/21 2202 07/01/21 0204 07/01/21 1018 07/01/21 1411  ?BP: (!) 157/81 (!) 121/52 (!) 147/73 (!) 150/65  ?Pulse: 85 62 74 71  ?Resp: 16 16 18  20  ?Temp: 98.2 ?F (36.8 ?C) 98.9 ?F (37.2 ?C) 97.6 ?F (36.4 ?C) 98.1 ?F (36.7 ?C)  ?TempSrc: Oral Oral Oral Oral  ?SpO2: 98% 97% 98% 99%  ?Weight:      ?Height:      ? ? ?Intake/Output Summary (Last 24 hours) at 07/01/2021 1730 ?Last data filed at 07/01/2021 0931 ?Gross per 24 hour  ?Intake 240 ml  ?Output 850 ml  ?Net -610 ml  ? ?Filed Weights  ? 06/30/21 0154  ?Weight: 64.4 kg  ? ? ?Examination: ? ?General exam: alert, awake, communicative,calm, NAD, NG in place ?Respiratory system: Clear to auscultation. Respiratory effort normal. ?Cardiovascular system:  RRR.  ?Gastrointestinal system: +bowel sounds  ?Central nervous system: Alert and oriented. No focal neurological deficits. ?Extremities:  no edema ?Skin: No rashes, lesions or ulcers ?Psychiatry: Judgement and insight appear normal. Mood & affect appropriate.  ? ? ? ?Data Reviewed: I have personally reviewed  labs and visualized  imaging studies since the last encounter and formulate the plan  ? ? ? ?Florencia Reasons, MD PhD FACP ?[facility] ? ?Available via Epic secure chat 7am-7pm for nonurgent issues ?Please page for urgent issues ?To page the attending provider between 7A-7P or the covering provider during after hours 7P-7A, please log into the web site  [email or web address] and access using universal [place] password for that web site. If you do not have the password, please call the hospital operator. ? ? ? ?07/01/2021, 5:30 PM  ? ? ?

## 2021-07-02 LAB — CBC WITH DIFFERENTIAL/PLATELET
Abs Immature Granulocytes: 0.01 10*3/uL (ref 0.00–0.07)
Basophils Absolute: 0 10*3/uL (ref 0.0–0.1)
Basophils Relative: 1 %
Eosinophils Absolute: 0.3 10*3/uL (ref 0.0–0.5)
Eosinophils Relative: 5 %
HCT: 31.4 % — ABNORMAL LOW (ref 36.0–46.0)
Hemoglobin: 9.5 g/dL — ABNORMAL LOW (ref 12.0–15.0)
Immature Granulocytes: 0 %
Lymphocytes Relative: 37 %
Lymphs Abs: 2.6 10*3/uL (ref 0.7–4.0)
MCH: 27 pg (ref 26.0–34.0)
MCHC: 30.3 g/dL (ref 30.0–36.0)
MCV: 89.2 fL (ref 80.0–100.0)
Monocytes Absolute: 0.7 10*3/uL (ref 0.1–1.0)
Monocytes Relative: 10 %
Neutro Abs: 3.3 10*3/uL (ref 1.7–7.7)
Neutrophils Relative %: 47 %
Platelets: 192 10*3/uL (ref 150–400)
RBC: 3.52 MIL/uL — ABNORMAL LOW (ref 3.87–5.11)
RDW: 14.7 % (ref 11.5–15.5)
WBC: 6.9 10*3/uL (ref 4.0–10.5)
nRBC: 0 % (ref 0.0–0.2)

## 2021-07-02 LAB — BASIC METABOLIC PANEL
Anion gap: 7 (ref 5–15)
BUN: 14 mg/dL (ref 8–23)
CO2: 25 mmol/L (ref 22–32)
Calcium: 8.8 mg/dL — ABNORMAL LOW (ref 8.9–10.3)
Chloride: 109 mmol/L (ref 98–111)
Creatinine, Ser: 1.02 mg/dL — ABNORMAL HIGH (ref 0.44–1.00)
GFR, Estimated: 56 mL/min — ABNORMAL LOW (ref >60)
Glucose, Bld: 104 mg/dL — ABNORMAL HIGH (ref 70–99)
Potassium: 3.9 mmol/L (ref 3.5–5.1)
Sodium: 141 mmol/L (ref 135–145)

## 2021-07-02 LAB — MAGNESIUM: Magnesium: 1.9 mg/dL (ref 1.7–2.4)

## 2021-07-02 MED ORDER — SUMATRIPTAN SUCCINATE 50 MG PO TABS
100.0000 mg | ORAL_TABLET | ORAL | Status: DC | PRN
  Administered 2021-07-02 – 2021-07-03 (×2): 100 mg via ORAL
  Filled 2021-07-02 (×5): qty 2

## 2021-07-02 MED ORDER — AMLODIPINE BESYLATE 10 MG PO TABS
10.0000 mg | ORAL_TABLET | Freq: Every day | ORAL | Status: DC
  Administered 2021-07-03: 10 mg via ORAL
  Filled 2021-07-02: qty 1

## 2021-07-02 MED ORDER — FLUTICASONE PROPIONATE 50 MCG/ACT NA SUSP
1.0000 | Freq: Every day | NASAL | Status: DC | PRN
  Filled 2021-07-02: qty 16

## 2021-07-02 MED ORDER — AMLODIPINE BESYLATE 10 MG PO TABS
10.0000 mg | ORAL_TABLET | Freq: Once | ORAL | Status: AC
Start: 2021-07-02 — End: 2021-07-02
  Administered 2021-07-02: 10 mg via ORAL
  Filled 2021-07-02: qty 1

## 2021-07-02 MED ORDER — PANTOPRAZOLE SODIUM 40 MG PO TBEC
40.0000 mg | DELAYED_RELEASE_TABLET | Freq: Every day | ORAL | Status: DC
  Administered 2021-07-02 – 2021-07-03 (×2): 40 mg via ORAL
  Filled 2021-07-02 (×2): qty 1

## 2021-07-02 MED ORDER — ACETAMINOPHEN 325 MG PO TABS
650.0000 mg | ORAL_TABLET | Freq: Once | ORAL | Status: AC
  Administered 2021-07-02: 650 mg via ORAL
  Filled 2021-07-02: qty 2

## 2021-07-02 MED ORDER — ASPIRIN EC 81 MG PO TBEC
81.0000 mg | DELAYED_RELEASE_TABLET | ORAL | Status: DC
  Administered 2021-07-02 – 2021-07-03 (×2): 81 mg via ORAL
  Filled 2021-07-02 (×2): qty 1

## 2021-07-02 NOTE — Discharge Summary (Incomplete)
?Discharge Summary ? ?Mandy Ortiz XHB:[id number] DOB: 1940/07/03 ? ?PCP: Lujean Amel, MD ? ?Admit date: 06/30/2021 ?Discharge date: 07/02/2021 ? ?Time spent:  24mns ? ?Recommendations for Outpatient Follow-up:  ?F/u with PCP within a week  for hospital discharge follow up, repeat cbc/bmp at follow up ? ? ?Discharge Diagnoses:  ?Active Hospital Problems  ? Diagnosis Date Noted  ? Small bowel obstruction ([place] 06/30/2021  ? Stage 3a chronic kidney disease (CKD) ([place] 06/30/2021  ? Migraines 06/30/2021  ? Leukocytosis 06/30/2021  ? Hypoglycemia 06/30/2021  ? AKI (acute kidney injury) ([place] 11/01/2015  ? Liver lesions, multiple 10/31/2015  ? Essential hypertension 10/30/2015  ? GERD (gastroesophageal reflux disease) 10/30/2015  ? Hyperlipidemia 05/27/2014  ?  ?Resolved Hospital Problems  ?No resolved problems to display.  ? ? ?Discharge Condition: stable ? ?Diet recommendation: heart healthy/carb modified ? ?Filed Weights  ? 06/30/21 0154  ?Weight: 64.4 kg  ? ? ?History of present illness:  ?* ? ?Hospital Course:  ?Principal Problem: ?  Small bowel obstruction ([place] ?Active Problems: ?  Hyperlipidemia ?  Essential hypertension ?  GERD (gastroesophageal reflux disease) ?  Liver lesions, multiple ?  AKI (acute kidney injury) ([place] ?  Stage 3a chronic kidney disease (CKD) ([place] ?  Migraines ?  Leukocytosis ?  Hypoglycemia ? ? ?Assessment and Plan: ?No notes have been filed under this hospital service. ?Service: Hospitalist ? ? ? ? ? ?Procedures: ?* ? ?Consultations: ?* ? ?Discharge Exam: ?BP 124/63 (BP Location: Right Arm)   Pulse 70   Temp 98.9 ?F (37.2 ?C) (Oral)   Resp 19   Ht '5\' 6"'$  (1.676 m)   Wt 64.4 kg   SpO2 98%   BMI 22.92 kg/m?  ? ?General: * ?Cardiovascular: * ?Respiratory: * ? ? ? ?Discharge Instructions   ? ? Diet - low sodium heart healthy   Complete by: As directed ?  ? Please stay on soft diet for a few days, advance diet to regular diet as tolerated  ? Increase activity slowly   Complete by:  As directed ?  ? ?  ? ?Allergies as of 07/02/2021   ? ?   Reactions  ? Sulfa Antibiotics Hives, Swelling  ? ?  ? ?  ?Medication List  ?  ? ?STOP taking these medications   ? ?butalbital-acetaminophen-caffeine 50-325-40 MG tablet ?Commonly known as: FIORICET ?  ?carbamide peroxide 6.5 % OTIC solution ?Commonly known as: DEBROX ?  ?ondansetron 4 MG disintegrating tablet ?Commonly known as: Zofran ODT ?  ?ranitidine 150 MG tablet ?Commonly known as: ZANTAC ?  ?sucralfate 1 GM/10ML suspension ?Commonly known as: Carafate ?  ? ?  ? ?TAKE these medications   ? ?amLODipine 10 MG tablet ?Commonly known as: NORVASC ?Take 1 tablet (10 mg total) by mouth daily. ?What changed: when to take this ?  ?aspirin 81 MG tablet ?Take 81 mg by mouth every morning. ?  ?CALCIUM-VITAMIN D PO ?Take 1 tablet by mouth every evening. ?  ?fluticasone 50 MCG/ACT nasal spray ?Commonly known as: FLONASE ?Place 1 spray into both nostrils daily. ?What changed:  ?when to take this ?reasons to take this ?  ?irbesartan 300 MG tablet ?Commonly known as: AVAPRO ?Take 1 tablet (300 mg total) by mouth daily. ?What changed:  ?how much to take ?when to take this ?  ?loratadine 10 MG tablet ?Commonly known as: CLARITIN ?Take 1 tablet (10 mg total) by mouth daily. ?  ?pantoprazole 40 MG tablet ?Commonly known as: PROTONIX ?Take 1 tablet (  40 mg total) by mouth daily. ?What changed: when to take this ?  ?simvastatin 20 MG tablet ?Commonly known as: ZOCOR ?Take 1 tablet (20 mg total) by mouth at bedtime. ?What changed: when to take this ?  ?SUMAtriptan 100 MG tablet ?Commonly known as: IMITREX ?Take 100 mg by mouth See admin instructions. Take 100 mg at onset of migraine and may repeat in 2 hours if headache persists or recurs (or as otherwise directed) ?  ?triamterene-hydrochlorothiazide 37.5-25 MG tablet ?Commonly known as: MAXZIDE-25 ?Take 1 tablet by mouth in the morning. ?  ? ?  ? ?Allergies  ?Allergen Reactions  ? Sulfa Antibiotics Hives and Swelling  ? ?  Follow-up Information   ? ? Koirala, Dibas, MD Follow up in 1 week(s).   ?Specialty: Family Medicine ?Why: hospital discharge follow up, repeat basic lab works including cbc/bmp ?Contact information: ?[address] ?[address] ?[place] [address] ?[phone] ? ? ?  ?  ? ?  ?  ? ?  ? ? ? ?The results of significant diagnostics from this hospitalization (including imaging, microbiology, ancillary and laboratory) are listed below for reference.   ? ?Significant Diagnostic Studies: ?CT Head Wo Contrast ? ?Result Date: 06/30/2021 ?CLINICAL DATA:  Headache, sudden, severe EXAM: CT HEAD WITHOUT CONTRAST TECHNIQUE: Contiguous axial images were obtained from the base of the skull through the vertex without intravenous contrast. RADIATION DOSE REDUCTION: This exam was performed according to the departmental dose-optimization program which includes automated exposure control, adjustment of the mA and/or kV according to patient size and/or use of iterative reconstruction technique. COMPARISON:  05/27/2014 FINDINGS: Brain: No acute intracranial abnormality. Specifically, no hemorrhage, hydrocephalus, mass lesion, acute infarction, or significant intracranial injury. Vascular: No hyperdense vessel or unexpected calcification. Skull: No acute calvarial abnormality. Sinuses/Orbits: No acute findings Other: None IMPRESSION: No acute intracranial abnormality. Electronically Signed   By: Rolm Baptise M.D.   On: 06/30/2021 02:25  ? ?CT ABDOMEN PELVIS W CONTRAST ? ?Result Date: 06/30/2021 ?CLINICAL DATA:  Epigastric pain EXAM: CT ABDOMEN AND PELVIS WITH CONTRAST TECHNIQUE: Multidetector CT imaging of the abdomen and pelvis was performed using the standard protocol following bolus administration of intravenous contrast. RADIATION DOSE REDUCTION: This exam was performed according to the departmental dose-optimization program which includes automated exposure control, adjustment of the mA and/or kV according to patient size  and/or use of iterative reconstruction technique. CONTRAST:  135m OMNIPAQUE IOHEXOL 300 MG/ML  SOLN COMPARISON:  06/11/2019.  MRI 12/31/2018 FINDINGS: Lower chest: No acute findings Hepatobiliary: Multiple enhancing lesions are seen throughout the liver. Largest in the left hepatic lobe measures 3.2 cm on image 1 of series 2. Largest in the right hepatic lobe measures 2.7 cm on image 14 of series 2. These enhancing areas were thought on prior MRI tube most likely reflect numerous adenomas. These appear similar in size although there may be several new small enhancing lesions on today's study. Gallbladder unremarkable. Pancreas: No focal abnormality or ductal dilatation. Spleen: No focal abnormality.  Normal size. Adrenals/Urinary Tract: Bilateral renal cysts, the largest in the left midpole measuring 2.8 cm. Adrenal glands unremarkable. No hydronephrosis. Urinary bladder unremarkable. Stomach/Bowel: Postoperative changes within distal small bowel. Small bowel is dilated air-fluid levels. Findings concerning for distal small bowel obstruction. Large bowel decompressed, grossly unremarkable. Stomach unremarkable. Vascular/Lymphatic: Aortic atherosclerosis. No evidence of aneurysm or adenopathy. Reproductive: Prior hysterectomy.  No adnexal masses. Other: No free fluid or free air. Musculoskeletal: No acute bony abnormality IMPRESSION: Numerous enhancing lesions throughout the liver.  The largest of these appear stable in size when compared to prior MRI from 2020. There may be new smaller lesions throughout the liver. These were felt on prior MRI to be most compatible with adenomas. These could be further evaluated with non emergent MRI if felt clinically indicated. Dilated small bowel loops with air-fluid levels into the lower pelvis. Postoperative changes are noted in the distal small bowel. Findings are concerning for distal small bowel obstruction. Distal small bowel loops are decompressed. Aortic atherosclerosis.  Electronically Signed   By: Rolm Baptise M.D.   On: 06/30/2021 03:29  ? ?DG Abd Portable 1V-Small Bowel Protocol-Position Verification ? ?Result Date: 06/30/2021 ?CLINICAL DATA:  NG tube placement EXAM: PORTABLE ABDO

## 2021-07-02 NOTE — Care Management Important Message (Signed)
Important Message ? ?Patient Details IM Letter given to the Patient. ?Name: Mandy Ortiz ?MRN: [id number] ?Date of Birth: Jul 17, 1940 ? ? ?Medicare Important Message Given:  Yes ? ? ? ? ?Kerin Salen ?07/02/2021, 12:21 PM ?

## 2021-07-02 NOTE — Progress Notes (Signed)
[facility] ?Progress Note ? ?   ?Subjective: ?CC:  ?Abd pain resolved. Tolerating liquids. +flatus and stool. Her cc is HA and reflux. ? ?Objective: ?Vital signs in last 24 hours: ?Temp:  [97.6 ?F (36.4 ?C)-98.9 ?F (37.2 ?C)] 98.9 ?F (37.2 ?C) (05/11 2050) ?Pulse Rate:  [70-74] 70 (05/11 2050) ?Resp:  [18-20] 19 (05/11 2050) ?BP: (124-150)/(63-73) 124/63 (05/11 2050) ?SpO2:  [98 %-99 %] 98 % (05/11 2050) ?  ? ?Intake/Output from previous day: ?05/11 0701 - 05/12 0700 ?In: 340 [P.O.:340] ?Out: 100 [Emesis/NG output:100] ?Intake/Output this shift: ?No intake/output data recorded. ? ?PE: ?Gen:  Alert, NAD, pleasant ?Card:  Regular rate and rhythm, pedal pulses 2+ BL ?Pulm:  Normal effort, clear to auscultation bilaterally ?Abd: Soft, nontender, nondistended, +BS ?Skin: warm and dry, no rashes  ?Psych: A&Ox3  ? ?Lab Results:  ?Recent Labs  ?  07/01/21 ?9604 07/02/21 ?5409  ?WBC 7.3 6.9  ?HGB 9.8* 9.5*  ?HCT 32.7* 31.4*  ?PLT 209 192  ? ?BMET ?Recent Labs  ?  07/01/21 ?1739 07/02/21 ?0318  ?NA 142 141  ?K 3.4* 3.9  ?CL 109 109  ?CO2 25 25  ?GLUCOSE 157* 104*  ?BUN 17 14  ?CREATININE 1.15* 1.02*  ?CALCIUM 9.1 8.8*  ? ?PT/INR ?No results for input(s): LABPROT, INR in the last 72 hours. ?CMP  ?   ?Component Value Date/Time  ? NA 141 07/02/2021 0318  ? K 3.9 07/02/2021 0318  ? CL 109 07/02/2021 0318  ? CO2 25 07/02/2021 0318  ? GLUCOSE 104 (H) 07/02/2021 0318  ? BUN 14 07/02/2021 0318  ? CREATININE 1.02 (H) 07/02/2021 0318  ? CALCIUM 8.8 (L) 07/02/2021 0318  ? PROT 6.6 07/01/2021 0355  ? ALBUMIN 3.4 (L) 07/01/2021 0355  ? AST 27 07/01/2021 0355  ? ALT 20 07/01/2021 0355  ? ALKPHOS 108 07/01/2021 0355  ? BILITOT 1.2 07/01/2021 0355  ? GFRNONAA 56 (L) 07/02/2021 0318  ? GFRAA 55 (L) 03/23/2018 1125  ? ?Lipase  ?   ?Component Value Date/Time  ? LIPASE 36 06/30/2021 0156  ? ? ? ? ? ?Studies/Results: ?DG Abd Portable 1V-Small Bowel Protocol-Position Verification ? ?Result Date: 06/30/2021 ?CLINICAL DATA:  NG tube  placement EXAM: PORTABLE ABDOMEN - 1 VIEW COMPARISON:  None Available. FINDINGS: 1226 hours. NG tube tip is in the distal stomach near the pylorus. Visualized upper abdomen shows nonspecific bowel gas pattern. Asymmetric elevation right hemidiaphragm noted. IMPRESSION: NG tube tip is in the distal stomach near the pylorus. Electronically Signed   By: Misty Stanley M.D.   On: 06/30/2021 13:11  ? ?DG Abd Portable 1V-Small Bowel Obstruction Protocol-initial, 8 hr delay ? ?Result Date: 06/30/2021 ?CLINICAL DATA:  Small bowel obstruction, 8 hour delayed film. EXAM: PORTABLE ABDOMEN - 1 VIEW COMPARISON:  CT earlier today. FINDINGS: Administered enteric contrast is seen throughout the entire colon. There is no definite gaseous small bowel distension on CT. Tip and side port of the enteric tube below the diaphragm in the stomach. IMPRESSION: Administered enteric contrast throughout the entire colon. Electronically Signed   By: Keith Rake M.D.   On: 06/30/2021 23:34   ? ?Anti-infectives: ?Anti-infectives (From admission, onward)  ? ? None  ? ?  ? ? ?Assessment/Plan ? SBO ?- CT w/ dilated small bowel loops with air-fluid levels into the lower pelvis with distal small bowel loops are decompressed.  ?-SBO protocol 5/10.  KUB with progression of enteric contrast to the colon.   ?- tolerating CLD. ADAT to SOFT.  I re-ordered her home protonix for GERD. Stable for discharge from a CCS standpoint if tolerating soft diet. ? ?  ?FEN - NPO, FLD, ADAT SOFT ?VTE - SCDs, okay for chemical prophylaxis from a general surgery standpoint ?ID - None  ? LOS: 2 days  ? ?I reviewed nursing notes, hospitalist notes, last 24 h vitals and pain scores, last 48 h intake and output, last 24 h labs and trends, and last 24 h imaging results. ? ? ?Obie Dredge, PA-C ?[facility] ?Please see Amion for pager number during day hours 7:00am-4:30pm ? ? ? ? ? ? ?

## 2021-07-02 NOTE — Progress Notes (Signed)
?PROGRESS NOTE ? ? ? ?Mandy Ortiz  ZOX:[id number] DOB: 09-Dec-1940 DOA: 06/30/2021 ?PCP: Lujean Amel, MD  ? ? ? ?Brief Narrative:  ? ?HTN, GERD, HLD. CKD 3a. Presenting with abdominal pain, found to have small bowel obstruction, NG placed ? ?Subjective: ? ?She does not feel like she can go home today, she wants to go home tomorrow ?She was seen in the morning, she is talking on the phone, does not appear in acute distress ?NG tube on suction ? ?Assessment & Plan: ? Principal Problem: ?  Small bowel obstruction ([place]) ?Active Problems: ?  Hyperlipidemia ?  Essential hypertension ?  GERD (gastroesophageal reflux disease) ?  Liver lesions, multiple ?  AKI (acute kidney injury) ([place]) ?  Stage 3a chronic kidney disease (CKD) ([place]) ?  Migraines ?  Leukocytosis ?  Hypoglycemia ? ? ? ?Assessment and Plan: ? ?SBO ?-Improving, ng removed, advance diet as tolerated ?-Appreciate gen surg input ? ?Leukocytosis ?    - no fever, likely reactive, normalized ? ?Liver lesions as seen on CT ?    - consider MRI after SBO stabilized ?  ?CKD3a ?    - at baseline; stable ?  ?HTN ?    -Gradually resume home regimen  ? ? Hyperglycemia ?-Likely from stress, improved ?    - no history of DM, A1c 6.8 ? ?  ?HLD ?    -resume home regimen  ? ?GERD ?    - protonix ?  ?Migraines ?    - resume home regimen  ?  ? ? ?I have Reviewed nursing notes, Vitals, pain scores, I/o's, Lab results and  imaging results since pt's last encounter, details please see discussion above  ?I ordered the following labs:  ? ? ? ?DVT prophylaxis: SCDs Start: 06/30/21 1458 ? ? ?Code Status:   Code Status: Full Code ? ?Family Communication: none at bedside  ?Disposition:  ? ?Status is: Inpatient ? ?Dispo: The patient is from: home ?             Anticipated d/c is to: home ?             Anticipated d/c date is: TBD, need general surgery clearance ? ?Antimicrobials:   ? ?Anti-infectives (From admission, onward)  ? ? None  ? ?  ? ? ? ? ? ?Objective: ?Vitals:  ? 07/01/21  1411 07/01/21 2050 07/02/21 1334 07/02/21 2053  ?BP: (!) 150/65 124/63 (!) 150/68 140/61  ?Pulse: 71 70 77 80  ?Resp: '20 19 18 20  '$ ?Temp: 98.1 ?F (36.7 ?C) 98.9 ?F (37.2 ?C) 98.8 ?F (37.1 ?C) 98.4 ?F (36.9 ?C)  ?TempSrc: Oral Oral Oral Oral  ?SpO2: 99% 98% 98% 98%  ?Weight:      ?Height:      ? ? ?Intake/Output Summary (Last 24 hours) at 07/02/2021 2223 ?Last data filed at 07/02/2021 1745 ?Gross per 24 hour  ?Intake 600 ml  ?Output --  ?Net 600 ml  ? ?Filed Weights  ? 06/30/21 0154  ?Weight: 64.4 kg  ? ? ?Examination: ? ?General exam: alert, awake, communicative,calm, NAD, ?Respiratory system: Clear to auscultation. Respiratory effort normal. ?Cardiovascular system:  RRR.  ?Gastrointestinal system: soft, nontender ,+bowel sounds  ?Central nervous system: Alert and oriented. No focal neurological deficits. ?Extremities:  no edema ?Skin: No rashes, lesions or ulcers ?Psychiatry: Judgement and insight appear normal. Mood & affect appropriate.  ? ? ? ?Data Reviewed: I have personally reviewed  labs and visualized  imaging studies since the last encounter  and formulate the plan  ? ? ? ?Florencia Reasons, MD PhD FACP ?[facility] ? ?Available via Epic secure chat 7am-7pm for nonurgent issues ?Please page for urgent issues ?To page the attending provider between 7A-7P or the covering provider during after hours 7P-7A, please log into the web site [email or web address] and access using universal [facility] password for that web site. If you do not have the password, please call the hospital operator. ? ? ? ?07/02/2021, 10:23 PM  ? ? ?

## 2021-07-03 LAB — CBC WITH DIFFERENTIAL/PLATELET
Abs Immature Granulocytes: 0.03 10*3/uL (ref 0.00–0.07)
Basophils Absolute: 0 10*3/uL (ref 0.0–0.1)
Basophils Relative: 0 %
Eosinophils Absolute: 0.3 10*3/uL (ref 0.0–0.5)
Eosinophils Relative: 4 %
HCT: 30.7 % — ABNORMAL LOW (ref 36.0–46.0)
Hemoglobin: 9.5 g/dL — ABNORMAL LOW (ref 12.0–15.0)
Immature Granulocytes: 0 %
Lymphocytes Relative: 38 %
Lymphs Abs: 2.6 10*3/uL (ref 0.7–4.0)
MCH: 27.3 pg (ref 26.0–34.0)
MCHC: 30.9 g/dL (ref 30.0–36.0)
MCV: 88.2 fL (ref 80.0–100.0)
Monocytes Absolute: 0.6 10*3/uL (ref 0.1–1.0)
Monocytes Relative: 9 %
Neutro Abs: 3.2 10*3/uL (ref 1.7–7.7)
Neutrophils Relative %: 49 %
Platelets: 198 10*3/uL (ref 150–400)
RBC: 3.48 MIL/uL — ABNORMAL LOW (ref 3.87–5.11)
RDW: 14.7 % (ref 11.5–15.5)
WBC: 6.7 10*3/uL (ref 4.0–10.5)
nRBC: 0 % (ref 0.0–0.2)

## 2021-07-03 LAB — BASIC METABOLIC PANEL
Anion gap: 8 (ref 5–15)
BUN: 15 mg/dL (ref 8–23)
CO2: 24 mmol/L (ref 22–32)
Calcium: 8.6 mg/dL — ABNORMAL LOW (ref 8.9–10.3)
Chloride: 106 mmol/L (ref 98–111)
Creatinine, Ser: 0.95 mg/dL (ref 0.44–1.00)
GFR, Estimated: >60 mL/min (ref >60)
Glucose, Bld: 110 mg/dL — ABNORMAL HIGH (ref 70–99)
Potassium: 3.5 mmol/L (ref 3.5–5.1)
Sodium: 138 mmol/L (ref 135–145)

## 2021-07-03 LAB — MAGNESIUM: Magnesium: 1.9 mg/dL (ref 1.7–2.4)

## 2021-07-03 MED ORDER — POLYETHYLENE GLYCOL 3350 17 G PO PACK
17.0000 g | PACK | Freq: Every day | ORAL | Status: DC
Start: 2021-07-03 — End: 2021-07-03
  Administered 2021-07-03: 17 g via ORAL
  Filled 2021-07-03: qty 1

## 2021-07-03 MED ORDER — IRBESARTAN 300 MG PO TABS: 150.0000 mg | ORAL_TABLET | Freq: Every morning | ORAL | Status: AC

## 2021-07-03 MED ORDER — IRBESARTAN 150 MG PO TABS
150.0000 mg | ORAL_TABLET | Freq: Every morning | ORAL | Status: DC
Start: 2021-07-03 — End: 2021-07-03
  Administered 2021-07-03: 150 mg via ORAL
  Filled 2021-07-03: qty 1

## 2021-07-03 MED ORDER — POLYETHYLENE GLYCOL 3350 17 G PO PACK: 17.0000 g | PACK | Freq: Every day | ORAL | 0 refills | Status: AC

## 2021-07-03 NOTE — Progress Notes (Signed)
Discharge instructions provided to and reviewed with patient.  Patient verbalized understanding.  PIV and cardiac monitoring removed.  Patient escorted to main entrance via wheelchair with belongings for transport home with family. ? ?Angie Fava, RN  ?

## 2021-07-03 NOTE — Progress Notes (Signed)
? ?  Subjective/Chief Complaint: ?No complaints. Feels fine ? ? ?Objective: ?Vital signs in last 24 hours: ?Temp:  [98.3 ?F (36.8 ?C)-98.8 ?F (37.1 ?C)] 98.3 ?F (36.8 ?C) (05/13 7510) ?Pulse Rate:  [65-80] 65 (05/13 0603) ?Resp:  [18-20] 19 (05/13 0603) ?BP: (140-159)/(61-68) 159/68 (05/13 0603) ?SpO2:  [96 %-98 %] 96 % (05/13 0603) ?  ? ?Intake/Output from previous day: ?05/12 0701 - 05/13 0700 ?In: 600 [P.O.:600] ?Out: -  ?Intake/Output this shift: ?No intake/output data recorded. ? ?General appearance: alert and cooperative ?Resp: clear to auscultation bilaterally ?Cardio: regular rate and rhythm ?GI: soft, nontender ? ?Lab Results:  ?Recent Labs  ?  07/02/21 ?0318 07/03/21 ?2585  ?WBC 6.9 6.7  ?HGB 9.5* 9.5*  ?HCT 31.4* 30.7*  ?PLT 192 198  ? ?BMET ?Recent Labs  ?  07/02/21 ?0318 07/03/21 ?0358  ?NA 141 138  ?K 3.9 3.5  ?CL 109 106  ?CO2 25 24  ?GLUCOSE 104* 110*  ?BUN 14 15  ?CREATININE 1.02* 0.95  ?CALCIUM 8.8* 8.6*  ? ?PT/INR ?No results for input(s): LABPROT, INR in the last 72 hours. ?ABG ?No results for input(s): PHART, HCO3 in the last 72 hours. ? ?Invalid input(s): PCO2, PO2 ? ?Studies/Results: ?No results found. ? ?Anti-infectives: ?Anti-infectives (From admission, onward)  ? ? None  ? ?  ? ? ?Assessment/Plan: ?s/p * No surgery found * ?Advance diet ?SBO resolving ?- CT w/ dilated small bowel loops with air-fluid levels into the lower pelvis with distal small bowel loops are decompressed.  ?-SBO protocol 5/10.  KUB with progression of enteric contrast to the colon.   ?- tolerating CLD. ADAT to SOFT. I re-ordered her home protonix for GERD. Stable for discharge from a CCS standpoint if tolerating soft diet. ?  ?  ?FEN - NPO, FLD, ADAT SOFT ?VTE - SCDs, okay for chemical prophylaxis from a general surgery standpoint ?ID - None  ? LOS: 3 days  ? ? ?Mandy Ortiz ?07/03/2021 ? ?

## 2021-07-03 NOTE — TOC Progression Note (Signed)
Transition of Care (TOC) - Progression Note  ? ? ?Patient Details  ?Name: Mandy Ortiz ?MRN: [id number] ?Date of Birth: [birth year] ? ?Transition of Care (TOC) CM/SW Contact  ?Purcell Mouton, RN ?Phone Number: ?07/03/2021, 10:06 AM ? ?Clinical Narrative:    ? ?Transition of Care (TOC) Screening Note ? ? ?Patient Details  ?Name: Mandy Ortiz ?Date of Birth: 18-May-1940 ? ? ?Transition of Care (TOC) CM/SW Contact:    ?Purcell Mouton, RN ?Phone Number: ?07/03/2021, 10:06 AM ? ? ? ?Transition of Care Department [facility]) has reviewed patient and no TOC needs have been identified at this time. We will continue to monitor patient advancement through interdisciplinary progression rounds. If new patient transition needs arise, please place a TOC consult. ?  ? ? ?  ?  ? ?Expected Discharge Plan and Services ?  ?  ?  ?  ?  ?Expected Discharge Date: 07/03/21               ?  ?  ?  ?  ?  ?  ?  ?  ?  ?  ? ? ?Social Determinants of Health (SDOH) Interventions ?  ? ?Readmission Risk Interventions ?   ? View : No data to display.  ?  ?  ?  ? ? ?

## 2021-07-03 NOTE — Plan of Care (Signed)

## 2021-07-03 NOTE — Plan of Care (Signed)

## 2021-07-03 NOTE — Discharge Summary (Signed)
?Discharge Summary ? ?Mandy Ortiz [id number] DOB: 1940/04/19 ? ?PCP: Mandy Amel, MD ? ?Admit date: 06/30/2021 ?Discharge date: 07/03/2021 ? ?Time spent: 67mns ? ?Recommendations for Outpatient Follow-up:  ?F/u with PCP within a week  for hospital discharge follow up, repeat cbc/bmp at follow up ?Pcp to consider Outpatient MRI to monitor liver lesions ? ? ?Discharge Diagnoses:  ?Active Hospital Problems  ? Diagnosis Date Noted  ? Small bowel obstruction ([place] 06/30/2021  ? Stage 3a chronic kidney disease (CKD) ([place] 06/30/2021  ? Migraines 06/30/2021  ? Leukocytosis 06/30/2021  ? Hypoglycemia 06/30/2021  ? AKI (acute kidney injury) ([place] 11/01/2015  ? Liver lesions, multiple 10/31/2015  ? Essential hypertension 10/30/2015  ? GERD (gastroesophageal reflux disease) 10/30/2015  ? Hyperlipidemia 05/27/2014  ?  ?Resolved Hospital Problems  ?No resolved problems to display.  ? ? ?Discharge Condition: stable ? ?Diet recommendation: heart healthy ? ?Filed Weights  ? 06/30/21 0154  ?Weight: 64.4 kg  ? ? ?History of present illness:  ? Mandy RAISANENis a 81y.o. female with medical history significant of HTN, GERD, HLD. CKD 3a. Presenting with abdominal pain. Her pain started last night around 2000hrs. It was sharp and constant. It periumbilical. She was nauseous, but had no vomiting. She thought that having a BM may help, so she tried a fleet enema. However, it was ineffective. When her symptoms didn't improve, she called her family to bring her to the ED for help. She denies any other aggravating or alleviating factors.  ?  ? ?Hospital Course:  ?Principal Problem: ?  Small bowel obstruction ([place] ?Active Problems: ?  Hyperlipidemia ?  Essential hypertension ?  GERD (gastroesophageal reflux disease) ?  Liver lesions, multiple ?  [place] injury) ([place] ?  Stage 3a chronic kidney disease (CKD) ([place] ?  Migraines ?  Leukocytosis ?  Hypoglycemia ? ? ?Assessment and Plan: ? ?SBO ?-Seen by general surgery,  needed NG decompression initially,  ?-Abdominal pain resolved, NG removed, tolerate diet advancement ? ?  ?Leukocytosis ?    - no fever, likely reactive, normalized ?  ?Liver lesions as seen on CT ?-Numerous enhancing lesions throughout the liver. The largest of ?these appear stable in size when compared to prior MRI from 2020. ?There may be new smaller lesions throughout the liver. These were ?felt on prior MRI to be most compatible with adenomas. These could ?be further evaluated with non emergent MRI if felt clinically ?indicated. ?F/u with pcp to discuss repeat liver MRI ?     ?  ?CKD3a ?-bun17/cr 1.18 on presentation, BUN 15/creatinine 0.95 at discharge ?-Renal dosing meds ?-Follow-up with PCP ?  ?HTN ?    -Home BP meds initially held , home regimen gradually resumed  ?-Continue all home regimen at discharge ?  ? Hyperglycemia ?-Likely from stress, improved ?    - no history of DM, A1c 6.8 ?-Follow-up with PCP ?  ?  ?HLD ?    -resume home regimen  ?  ?GERD ?    - protonix ?  ?Migraines ?    - resume home regimen  ? ?IBS ? ? Baseline  independent, lives by herself, doing yard work, driving and socially active, good family support ? ?Discharge Exam: ?BP (!) 159/68 (BP Location: Left Arm)   Pulse 65   Temp 98.3 ?F (36.8 ?C) (Oral)   Resp 19   Ht '5\' 6"'$  (1.676 m)   Wt 64.4 kg   SpO2 96%   BMI 22.92 kg/m?  ? ?  General: NAD ?Cardiovascular: RRR ?Respiratory: Normal respiratory effort ? ? ? ?Discharge Instructions   ? ? Diet - low sodium heart healthy   Complete by: As directed ?  ? Please stay on soft diet for a few days, advance diet to regular diet as tolerated  ? Increase activity slowly   Complete by: As directed ?  ? ?  ? ?Allergies as of 07/03/2021   ? ?   Reactions  ? Sulfa Antibiotics Hives, Swelling  ? ?  ? ?  ?Medication List  ?  ? ?STOP taking these medications   ? ?butalbital-acetaminophen-caffeine 50-325-40 MG tablet ?Commonly known as: FIORICET ?  ?carbamide peroxide 6.5 % OTIC solution ?Commonly  known as: DEBROX ?  ?ondansetron 4 MG disintegrating tablet ?Commonly known as: Zofran ODT ?  ?ranitidine 150 MG tablet ?Commonly known as: ZANTAC ?  ?sucralfate 1 GM/10ML suspension ?Commonly known as: Carafate ?  ? ?  ? ?TAKE these medications   ? ?amLODipine 10 MG tablet ?Commonly known as: NORVASC ?Take 1 tablet (10 mg total) by mouth daily. ?What changed: when to take this ?  ?aspirin 81 MG tablet ?Take 81 mg by mouth every morning. ?  ?CALCIUM-VITAMIN D PO ?Take 1 tablet by mouth every evening. ?  ?fluticasone 50 MCG/ACT nasal spray ?Commonly known as: FLONASE ?Place 1 spray into both nostrils daily. ?What changed:  ?when to take this ?reasons to take this ?  ?irbesartan 300 MG tablet ?Commonly known as: AVAPRO ?Take 0.5 tablets (150 mg total) by mouth in the morning. ?  ?loratadine 10 MG tablet ?Commonly known as: CLARITIN ?Take 1 tablet (10 mg total) by mouth daily. ?  ?pantoprazole 40 MG tablet ?Commonly known as: PROTONIX ?Take 1 tablet (40 mg total) by mouth daily. ?What changed: when to take this ?  ?polyethylene glycol 17 g packet ?Commonly known as: MIRALAX / GLYCOLAX ?Take 17 g by mouth daily. ?  ?simvastatin 20 MG tablet ?Commonly known as: ZOCOR ?Take 1 tablet (20 mg total) by mouth at bedtime. ?What changed: when to take this ?  ?SUMAtriptan 100 MG tablet ?Commonly known as: IMITREX ?Take 100 mg by mouth See admin instructions. Take 100 mg at onset of migraine and may repeat in 2 hours if headache persists or recurs (or as otherwise directed) ?  ?triamterene-hydrochlorothiazide 37.5-25 MG tablet ?Commonly known as: MAXZIDE-25 ?Take 1 tablet by mouth in the morning. ?  ? ?  ? ?Allergies  ?Allergen Reactions  ? Sulfa Antibiotics Hives and Swelling  ? ? Follow-up Information   ? ? Koirala, Dibas, MD Follow up in 1 week(s).   ?Specialty: Family Medicine ?Why: hospital discharge follow up, repeat basic lab works including cbc/bmp. ?please discuss with your pcp regarding repeat liver MRI to check spots  on liver ?Contact information: ?[address] ?[address] ?[place] [address] ?[phone] ? ? ?  ?  ? ?  ?  ? ?  ? ? ? ?The results of significant diagnostics from this hospitalization (including imaging, microbiology, ancillary and laboratory) are listed below for reference.   ? ?Significant Diagnostic Studies: ?CT Head Wo Contrast ? ?Result Date: 06/30/2021 ?CLINICAL DATA:  Headache, sudden, severe EXAM: CT HEAD WITHOUT CONTRAST TECHNIQUE: Contiguous axial images were obtained from the base of the skull through the vertex without intravenous contrast. RADIATION DOSE REDUCTION: This exam was performed according to the departmental dose-optimization program which includes automated exposure control, adjustment of the mA and/or kV according to patient size and/or use of iterative  reconstruction technique. COMPARISON:  05/27/2014 FINDINGS: Brain: No acute intracranial abnormality. Specifically, no hemorrhage, hydrocephalus, mass lesion, acute infarction, or significant intracranial injury. Vascular: No hyperdense vessel or unexpected calcification. Skull: No acute calvarial abnormality. Sinuses/Orbits: No acute findings Other: None IMPRESSION: No acute intracranial abnormality. Electronically Signed   By: Rolm Baptise M.D.   On: 06/30/2021 02:25  ? ?CT ABDOMEN PELVIS W CONTRAST ? ?Result Date: 06/30/2021 ?CLINICAL DATA:  Epigastric pain EXAM: CT ABDOMEN AND PELVIS WITH CONTRAST TECHNIQUE: Multidetector CT imaging of the abdomen and pelvis was performed using the standard protocol following bolus administration of intravenous contrast. RADIATION DOSE REDUCTION: This exam was performed according to the departmental dose-optimization program which includes automated exposure control, adjustment of the mA and/or kV according to patient size and/or use of iterative reconstruction technique. CONTRAST:  189m OMNIPAQUE IOHEXOL 300 MG/ML  SOLN COMPARISON:  06/11/2019.  MRI 12/31/2018 FINDINGS: Lower chest: No acute  findings Hepatobiliary: Multiple enhancing lesions are seen throughout the liver. Largest in the left hepatic lobe measures 3.2 cm on image 1 of series 2. Largest in the right hepatic lobe measures 2.7 cm on i

## 2021-07-13 DIAGNOSIS — J341 Cyst and mucocele of nose and nasal sinus: Secondary | ICD-10-CM | POA: Diagnosis not present

## 2021-07-13 DIAGNOSIS — D649 Anemia, unspecified: Secondary | ICD-10-CM | POA: Diagnosis not present

## 2021-07-13 DIAGNOSIS — M8580 Other specified disorders of bone density and structure, unspecified site: Secondary | ICD-10-CM | POA: Diagnosis not present

## 2021-07-13 DIAGNOSIS — Z8719 Personal history of other diseases of the digestive system: Secondary | ICD-10-CM | POA: Diagnosis not present

## 2021-07-13 DIAGNOSIS — K769 Liver disease, unspecified: Secondary | ICD-10-CM | POA: Diagnosis not present

## 2021-07-13 DIAGNOSIS — D490 Neoplasm of unspecified behavior of digestive system: Secondary | ICD-10-CM | POA: Diagnosis not present

## 2021-07-16 ENCOUNTER — Other Ambulatory Visit: Payer: Self-pay | Admitting: Family Medicine

## 2021-07-16 DIAGNOSIS — D490 Neoplasm of unspecified behavior of digestive system: Secondary | ICD-10-CM

## 2021-07-16 DIAGNOSIS — K769 Liver disease, unspecified: Secondary | ICD-10-CM

## 2021-07-21 ENCOUNTER — Emergency Department (HOSPITAL_COMMUNITY)
Admission: EM | Admit: 2021-07-21 | Discharge: 2021-07-21 | Disposition: A | Discharge status: Home / Self Care | Payer: Medicare Other | Attending: Emergency Medicine | Admitting: Emergency Medicine

## 2021-07-21 ENCOUNTER — Encounter (HOSPITAL_COMMUNITY): Payer: Self-pay | Admitting: Emergency Medicine

## 2021-07-21 ENCOUNTER — Other Ambulatory Visit: Payer: Self-pay

## 2021-07-21 DIAGNOSIS — R519 Headache, unspecified: Secondary | ICD-10-CM | POA: Diagnosis not present

## 2021-07-21 DIAGNOSIS — Z7982 Long term (current) use of aspirin: Secondary | ICD-10-CM | POA: Diagnosis not present

## 2021-07-21 DIAGNOSIS — R42 Dizziness and giddiness: Secondary | ICD-10-CM | POA: Diagnosis not present

## 2021-07-21 DIAGNOSIS — Z79899 Other long term (current) drug therapy: Secondary | ICD-10-CM | POA: Diagnosis not present

## 2021-07-21 DIAGNOSIS — R1030 Lower abdominal pain, unspecified: Secondary | ICD-10-CM | POA: Diagnosis present

## 2021-07-21 DIAGNOSIS — R7989 Other specified abnormal findings of blood chemistry: Secondary | ICD-10-CM | POA: Diagnosis not present

## 2021-07-21 DIAGNOSIS — R978 Other abnormal tumor markers: Secondary | ICD-10-CM | POA: Insufficient documentation

## 2021-07-21 DIAGNOSIS — Z853 Personal history of malignant neoplasm of breast: Secondary | ICD-10-CM | POA: Diagnosis not present

## 2021-07-21 DIAGNOSIS — N289 Disorder of kidney and ureter, unspecified: Secondary | ICD-10-CM | POA: Diagnosis not present

## 2021-07-21 DIAGNOSIS — I129 Hypertensive chronic kidney disease with stage 1 through stage 4 chronic kidney disease, or unspecified chronic kidney disease: Secondary | ICD-10-CM | POA: Insufficient documentation

## 2021-07-21 DIAGNOSIS — E860 Dehydration: Secondary | ICD-10-CM | POA: Insufficient documentation

## 2021-07-21 DIAGNOSIS — R748 Abnormal levels of other serum enzymes: Secondary | ICD-10-CM | POA: Diagnosis not present

## 2021-07-21 DIAGNOSIS — R7309 Other abnormal glucose: Secondary | ICD-10-CM | POA: Diagnosis not present

## 2021-07-21 DIAGNOSIS — R197 Diarrhea, unspecified: Secondary | ICD-10-CM | POA: Insufficient documentation

## 2021-07-21 DIAGNOSIS — N189 Chronic kidney disease, unspecified: Secondary | ICD-10-CM | POA: Diagnosis not present

## 2021-07-21 LAB — CBC WITH DIFFERENTIAL/PLATELET
Abs Immature Granulocytes: 0.02 10*3/uL (ref 0.00–0.07)
Basophils Absolute: 0 10*3/uL (ref 0.0–0.1)
Basophils Relative: 1 %
Eosinophils Absolute: 0.2 10*3/uL (ref 0.0–0.5)
Eosinophils Relative: 3 %
HCT: 36.3 % (ref 36.0–46.0)
Hemoglobin: 11.2 g/dL — ABNORMAL LOW (ref 12.0–15.0)
Immature Granulocytes: 0 %
Lymphocytes Relative: 32 %
Lymphs Abs: 2.7 10*3/uL (ref 0.7–4.0)
MCH: 27.1 pg (ref 26.0–34.0)
MCHC: 30.9 g/dL (ref 30.0–36.0)
MCV: 87.7 fL (ref 80.0–100.0)
Monocytes Absolute: 0.7 10*3/uL (ref 0.1–1.0)
Monocytes Relative: 9 %
Neutro Abs: 4.7 10*3/uL (ref 1.7–7.7)
Neutrophils Relative %: 55 %
Platelets: 245 10*3/uL (ref 150–400)
RBC: 4.14 MIL/uL (ref 3.87–5.11)
RDW: 14.1 % (ref 11.5–15.5)
WBC: 8.5 10*3/uL (ref 4.0–10.5)
nRBC: 0 % (ref 0.0–0.2)

## 2021-07-21 LAB — COMPREHENSIVE METABOLIC PANEL
ALT: 15 U/L (ref 0–44)
AST: 28 U/L (ref 15–41)
Albumin: 4 g/dL (ref 3.5–5.0)
Alkaline Phosphatase: 129 U/L — ABNORMAL HIGH (ref 38–126)
Anion gap: 11 (ref 5–15)
BUN: 25 mg/dL — ABNORMAL HIGH (ref 8–23)
CO2: 25 mmol/L (ref 22–32)
Calcium: 9.8 mg/dL (ref 8.9–10.3)
Chloride: 105 mmol/L (ref 98–111)
Creatinine, Ser: 1.62 mg/dL — ABNORMAL HIGH (ref 0.44–1.00)
GFR, Estimated: 32 mL/min — ABNORMAL LOW (ref >60)
Glucose, Bld: 143 mg/dL — ABNORMAL HIGH (ref 70–99)
Potassium: 3.8 mmol/L (ref 3.5–5.1)
Sodium: 141 mmol/L (ref 135–145)
Total Bilirubin: 0.7 mg/dL (ref 0.3–1.2)
Total Protein: 7.9 g/dL (ref 6.5–8.1)

## 2021-07-21 MED ORDER — SODIUM CHLORIDE 0.9 % IV BOLUS
500.0000 mL | Freq: Once | INTRAVENOUS | Status: AC
Start: 2021-07-21 — End: 2021-07-21
  Administered 2021-07-21: 500 mL via INTRAVENOUS

## 2021-07-21 MED ORDER — SODIUM CHLORIDE 0.9 % IV BOLUS
500.0000 mL | Freq: Once | INTRAVENOUS | Status: AC
  Administered 2021-07-21: 500 mL via INTRAVENOUS

## 2021-07-21 MED ORDER — SODIUM CHLORIDE 0.9 % IV SOLN: INTRAVENOUS | Status: DC

## 2021-07-21 NOTE — ED Triage Notes (Signed)
Pt reports abd pain, dizziness and a headache since last night

## 2021-07-21 NOTE — Discharge Instructions (Signed)
It appears that you are dehydrated today likely from multiple reasons.  Try to drink plenty of fluids especially water, 3 to 4 glasses each day.  For the diarrhea take Kaopectate or Imodium.  Your kidneys indicate that you are dehydrated and this test will need to be rechecked when you see your doctor next week.  Continue taking the doxycycline, which her doctor prescribed for sinusitis.  Sometimes this can cause abdominal pain so if needed you can take a medication like Pepto-Bismol to help the discomfort.

## 2021-07-21 NOTE — ED Provider Notes (Signed)
[facility] DEPT Provider Note   CSN: [id number] Arrival date & time: 07/21/21  1602     History  Chief Complaint  Patient presents with   Abdominal Pain   Headache    Mandy Ortiz is a 81 y.o. female.  HPI Patient presenting for evaluation of dizziness and headaches .  She has a history of hypertension, hyperlipidemia, migraine headaches, reflux and renal insufficiency.  She also has a history of breast cancer and nasal cyst.  Patient is also had diarrhea for 2 weeks but denies change in appetite.  She is on a soft diet because of problems with her dentures.  She is due to see a ENT doctor tomorrow to evaluate her ongoing sinus symptoms which include sinus infections possibly causing headache.  She was started on doxycycline today by her PCP after visit several days ago to treat possible sinusitis.  She developed abdominal pain after taking the first dose of doxycycline.  Abdominal pain is in the lower abdomen.  Abdominal pain has improved since it initially started.    Home Medications Prior to Admission medications   Medication Sig Start Date End Date Taking? Authorizing Provider  amLODipine (NORVASC) 10 MG tablet Take 1 tablet (10 mg total) by mouth daily. Patient taking differently: Take 10 mg by mouth in the morning. 05/30/14   Ghimire, Henreitta Leber, MD  aspirin 81 MG tablet Take 81 mg by mouth every morning.     [provider]  CALCIUM-VITAMIN D PO Take 1 tablet by mouth every evening.    [provider]  fluticasone (FLONASE) 50 MCG/ACT nasal spray Place 1 spray into both nostrils daily. Patient taking differently: Place 1 spray into both nostrils daily as needed for allergies or rhinitis. 04/01/21   Crain, Whitney L, PA  irbesartan (AVAPRO) 300 MG tablet Take 0.5 tablets (150 mg total) by mouth in the morning. 07/03/21   Florencia Reasons, MD  loratadine (CLARITIN) 10 MG tablet Take 1 tablet (10 mg total) by mouth daily. 04/01/21   Crain,  Whitney L, PA  pantoprazole (PROTONIX) 40 MG tablet Take 1 tablet (40 mg total) by mouth daily. Patient taking differently: Take 40 mg by mouth daily before breakfast. 05/30/14   Ghimire, Henreitta Leber, MD  polyethylene glycol (MIRALAX / GLYCOLAX) 17 g packet Take 17 g by mouth daily. 07/03/21   Florencia Reasons, MD  simvastatin (ZOCOR) 20 MG tablet Take 1 tablet (20 mg total) by mouth at bedtime. Patient taking differently: Take 20 mg by mouth every evening. 05/30/14   Ghimire, Henreitta Leber, MD  SUMAtriptan (IMITREX) 100 MG tablet Take 100 mg by mouth See admin instructions. Take 100 mg at onset of migraine and may repeat in 2 hours if headache persists or recurs (or as otherwise directed)    [provider]  triamterene-hydrochlorothiazide (MAXZIDE-25) 37.5-25 MG tablet Take 1 tablet by mouth in the morning.    [provider]      Allergies    Sulfa antibiotics    Review of Systems   Review of Systems  Physical Exam Updated Vital Signs BP (!) 170/61   Pulse 95   Temp 98.2 F (36.8 C) (Oral)   Resp 16   SpO2 99%  Physical Exam Vitals and nursing note reviewed.  Constitutional:      General: She is not in acute distress.    Appearance: She is well-developed. She is not ill-appearing, toxic-appearing or diaphoretic.  HENT:     Head: Normocephalic and  atraumatic.     Right Ear: External ear normal.     Left Ear: External ear normal.     Nose: No congestion or rhinorrhea.     Mouth/Throat:     Pharynx: No oropharyngeal exudate.  Eyes:     Conjunctiva/sclera: Conjunctivae normal.     Pupils: Pupils are equal, round, and reactive to light.  Neck:     Trachea: Phonation normal.  Cardiovascular:     Rate and Rhythm: Normal rate and regular rhythm.     Heart sounds: Normal heart sounds.  Pulmonary:     Effort: Pulmonary effort is normal.     Breath sounds: Normal breath sounds.  Abdominal:     General: There is no distension.     Palpations: Abdomen is soft. There is no mass.      Tenderness: There is no abdominal tenderness.  Musculoskeletal:        General: Normal range of motion.     Cervical back: Normal range of motion and neck supple.  Skin:    General: Skin is warm and dry.  Neurological:     Mental Status: She is alert and oriented to person, place, and time.     Cranial Nerves: No cranial nerve deficit.     Sensory: No sensory deficit.     Motor: No abnormal muscle tone.     Coordination: Coordination normal.  Psychiatric:        Mood and Affect: Mood normal.        Behavior: Behavior normal.        Thought Content: Thought content normal.        Judgment: Judgment normal.    ED Results / Procedures / Treatments   Labs (all labs ordered are listed, but only abnormal results are displayed) Labs Reviewed  COMPREHENSIVE METABOLIC PANEL - Abnormal; Notable for the following components:      Result Value   Glucose, Bld 143 (*)    BUN 25 (*)    Creatinine, Ser 1.62 (*)    Alkaline Phosphatase 129 (*)    GFR, Estimated 32 (*)    All other components within normal limits  CBC WITH DIFFERENTIAL/PLATELET - Abnormal; Notable for the following components:   Hemoglobin 11.2 (*)    All other components within normal limits    EKG EKG Interpretation  Date/Time:  Wednesday Jul 21 2021 17:22:17 EDT Ventricular Rate:  78 PR Interval:  174 QRS Duration: 90 QT Interval:  391 QTC Calculation: 446 R Axis:   64 Text Interpretation: Sinus rhythm Borderline T wave abnormalities since last tracing no significant change Confirmed by Daleen Bo [phone]) on 07/21/2021 6:23:26 PM  Radiology No results found.  Procedures Procedures    Medications Ordered in ED Medications  0.9 %  sodium chloride infusion ( Intravenous New Bag/Given 07/21/21 1834)  sodium chloride 0.9 % bolus 500 mL (0 mLs Intravenous Stopped 07/21/21 1800)  sodium chloride 0.9 % bolus 500 mL (0 mLs Intravenous Stopped 07/21/21 1950)    ED Course/ Medical Decision Making/ A&P                            Medical Decision Making Patient here with parent symptoms including headache, abdominal pain and diarrhea.  She was started on doxycycline yesterday for possible sinus infection causing headaches.  Headaches are apparently ongoing.  Problems Addressed: Dehydration: acute illness or injury    Details: Multifactorial Diarrhea, unspecified type:  Details: Ongoing for 2 weeks Renal insufficiency: acute illness or injury  Amount and/or Complexity of Data Reviewed Independent Historian: caregiver    Details: She is able to give some history, family member gives most of it. External Data Reviewed: labs, radiology and notes.    Details: Hospitalized then discharged after 3-day visit for small bowel obstruction, 3 weeks ago.  She did not require surgical intervention at that time. Labs: ordered.    Details: CBC, metabolic panel-normal except glucose high, creatinine high, BUN high, alk phos stays high, GFR low, hemoglobin low ECG/medicine tests: ordered and independent interpretation performed.    Details: Cardiac monitor-normal sinus rhythm  Risk OTC drugs. Prescription drug management. Decision regarding hospitalization. Risk Details: Patient presenting abdominal pain and diarrhea currently being treated for possible sinus infection.  Abdominal pain was severe initially but has resolved.  It occurred after her first dose of doxycycline.  Clinical evaluation and laboratory evaluation consistent with dehydration, elevated BUN and creatinine from baseline.  Patient treated with IV fluids for resuscitation.  Orthostatic vital signs done at 5:29 PM.  Orthostatics were negative but she complained of dizziness with standing.  The remainder of the vital signs normal.  Unclear cause for headache and dizziness.  Possible sinus infection.  Patient has apparently chronic symptoms, and is due to see ENT, tomorrow for reevaluation and assessment of a left maxillary sinus cyst.  Patient with  mild renal insufficiency consistent with dehydration.  She does not require hospitalization at this time.           Final Clinical Impression(s) / ED Diagnoses Final diagnoses:  Diarrhea, unspecified type  Dehydration  Renal insufficiency    Rx / DC Orders ED Discharge Orders     None         Daleen Bo, MD 07/21/21 2002

## 2021-07-21 NOTE — ED Notes (Signed)
Unable to finish orthostatic signs, Pts headache started back when moving to laying to sitting. When pt was standing, pt felt very light-headed and dizzy.

## 2021-07-31 ENCOUNTER — Ambulatory Visit
Admission: RE | Admit: 2021-07-31 | Discharge: 2021-07-31 | Disposition: A | Discharge status: Home / Self Care | Payer: Medicare Other | Source: Ambulatory Visit | Attending: Family Medicine | Admitting: Family Medicine

## 2021-07-31 DIAGNOSIS — K7689 Other specified diseases of liver: Secondary | ICD-10-CM | POA: Diagnosis not present

## 2021-07-31 DIAGNOSIS — K862 Cyst of pancreas: Secondary | ICD-10-CM | POA: Diagnosis not present

## 2021-07-31 DIAGNOSIS — K769 Liver disease, unspecified: Secondary | ICD-10-CM

## 2021-07-31 DIAGNOSIS — R197 Diarrhea, unspecified: Secondary | ICD-10-CM | POA: Diagnosis not present

## 2021-07-31 DIAGNOSIS — D134 Benign neoplasm of liver: Secondary | ICD-10-CM | POA: Diagnosis not present

## 2021-07-31 DIAGNOSIS — D490 Neoplasm of unspecified behavior of digestive system: Secondary | ICD-10-CM

## 2021-07-31 MED ORDER — GADOBENATE DIMEGLUMINE 529 MG/ML IV SOLN
13.0000 mL | Freq: Once | INTRAVENOUS | Status: AC | PRN
  Administered 2021-07-31: 13 mL via INTRAVENOUS

## 2021-08-16 DIAGNOSIS — M8580 Other specified disorders of bone density and structure, unspecified site: Secondary | ICD-10-CM | POA: Diagnosis not present

## 2021-08-16 DIAGNOSIS — D649 Anemia, unspecified: Secondary | ICD-10-CM | POA: Diagnosis not present

## 2021-08-16 DIAGNOSIS — I10 Essential (primary) hypertension: Secondary | ICD-10-CM | POA: Diagnosis not present

## 2021-08-16 DIAGNOSIS — E7800 Pure hypercholesterolemia, unspecified: Secondary | ICD-10-CM | POA: Diagnosis not present

## 2021-09-20 DIAGNOSIS — J341 Cyst and mucocele of nose and nasal sinus: Secondary | ICD-10-CM | POA: Diagnosis not present

## 2021-09-20 DIAGNOSIS — R519 Headache, unspecified: Secondary | ICD-10-CM | POA: Diagnosis not present

## 2021-09-29 DIAGNOSIS — K219 Gastro-esophageal reflux disease without esophagitis: Secondary | ICD-10-CM | POA: Diagnosis not present

## 2021-09-29 DIAGNOSIS — Z0000 Encounter for general adult medical examination without abnormal findings: Secondary | ICD-10-CM | POA: Diagnosis not present

## 2021-09-29 DIAGNOSIS — R911 Solitary pulmonary nodule: Secondary | ICD-10-CM | POA: Diagnosis not present

## 2021-09-29 DIAGNOSIS — D490 Neoplasm of unspecified behavior of digestive system: Secondary | ICD-10-CM | POA: Diagnosis not present

## 2021-09-29 DIAGNOSIS — K769 Liver disease, unspecified: Secondary | ICD-10-CM | POA: Diagnosis not present

## 2021-09-29 DIAGNOSIS — I700 Atherosclerosis of aorta: Secondary | ICD-10-CM | POA: Diagnosis not present

## 2021-09-29 DIAGNOSIS — E7800 Pure hypercholesterolemia, unspecified: Secondary | ICD-10-CM | POA: Diagnosis not present

## 2021-09-29 DIAGNOSIS — I10 Essential (primary) hypertension: Secondary | ICD-10-CM | POA: Diagnosis not present

## 2021-09-29 DIAGNOSIS — Z79899 Other long term (current) drug therapy: Secondary | ICD-10-CM | POA: Diagnosis not present

## 2021-09-29 DIAGNOSIS — D229 Melanocytic nevi, unspecified: Secondary | ICD-10-CM | POA: Diagnosis not present

## 2021-09-29 DIAGNOSIS — G43009 Migraine without aura, not intractable, without status migrainosus: Secondary | ICD-10-CM | POA: Diagnosis not present

## 2021-09-29 DIAGNOSIS — N1831 Chronic kidney disease, stage 3a: Secondary | ICD-10-CM | POA: Diagnosis not present

## 2021-09-29 DIAGNOSIS — D649 Anemia, unspecified: Secondary | ICD-10-CM | POA: Diagnosis not present

## 2021-10-14 ENCOUNTER — Other Ambulatory Visit (HOSPITAL_COMMUNITY): Payer: Self-pay | Admitting: Family Medicine

## 2021-10-14 DIAGNOSIS — R197 Diarrhea, unspecified: Secondary | ICD-10-CM

## 2021-10-14 DIAGNOSIS — R911 Solitary pulmonary nodule: Secondary | ICD-10-CM

## 2021-10-14 DIAGNOSIS — Z8719 Personal history of other diseases of the digestive system: Secondary | ICD-10-CM

## 2021-10-14 DIAGNOSIS — K769 Liver disease, unspecified: Secondary | ICD-10-CM

## 2021-10-29 ENCOUNTER — Other Ambulatory Visit (HOSPITAL_COMMUNITY): Payer: Medicare Other

## 2021-11-04 ENCOUNTER — Encounter (HOSPITAL_COMMUNITY)
Admission: RE | Admit: 2021-11-04 | Discharge: 2021-11-04 | Disposition: A | Discharge status: Home / Self Care | Payer: Medicare Other | Source: Ambulatory Visit | Attending: Family Medicine | Admitting: Family Medicine

## 2021-11-04 DIAGNOSIS — Z8719 Personal history of other diseases of the digestive system: Secondary | ICD-10-CM | POA: Insufficient documentation

## 2021-11-04 DIAGNOSIS — K769 Liver disease, unspecified: Secondary | ICD-10-CM | POA: Insufficient documentation

## 2021-11-04 DIAGNOSIS — R197 Diarrhea, unspecified: Secondary | ICD-10-CM | POA: Diagnosis not present

## 2021-11-04 DIAGNOSIS — R911 Solitary pulmonary nodule: Secondary | ICD-10-CM | POA: Insufficient documentation

## 2021-11-04 DIAGNOSIS — I700 Atherosclerosis of aorta: Secondary | ICD-10-CM | POA: Diagnosis not present

## 2021-11-04 DIAGNOSIS — N281 Cyst of kidney, acquired: Secondary | ICD-10-CM | POA: Insufficient documentation

## 2021-11-04 DIAGNOSIS — R918 Other nonspecific abnormal finding of lung field: Secondary | ICD-10-CM | POA: Insufficient documentation

## 2021-11-04 MED ORDER — COPPER CU 64 DOTATATE 1 MCI/ML IV SOLN
4.0000 | Freq: Once | INTRAVENOUS | Status: AC
  Administered 2021-11-04: 4 via INTRAVENOUS

## 2022-01-07 DIAGNOSIS — R942 Abnormal results of pulmonary function studies: Secondary | ICD-10-CM | POA: Diagnosis not present

## 2022-01-07 DIAGNOSIS — K862 Cyst of pancreas: Secondary | ICD-10-CM | POA: Diagnosis not present

## 2022-01-17 DIAGNOSIS — N1831 Chronic kidney disease, stage 3a: Secondary | ICD-10-CM | POA: Diagnosis not present

## 2022-01-17 DIAGNOSIS — E785 Hyperlipidemia, unspecified: Secondary | ICD-10-CM | POA: Diagnosis not present

## 2022-01-17 DIAGNOSIS — G43009 Migraine without aura, not intractable, without status migrainosus: Secondary | ICD-10-CM | POA: Diagnosis not present

## 2022-01-17 DIAGNOSIS — I10 Essential (primary) hypertension: Secondary | ICD-10-CM | POA: Diagnosis not present

## 2022-02-23 DIAGNOSIS — M79605 Pain in left leg: Secondary | ICD-10-CM | POA: Diagnosis not present

## 2022-02-23 DIAGNOSIS — R509 Fever, unspecified: Secondary | ICD-10-CM | POA: Diagnosis not present

## 2022-03-09 DIAGNOSIS — M25562 Pain in left knee: Secondary | ICD-10-CM | POA: Diagnosis not present

## 2022-03-09 DIAGNOSIS — M5432 Sciatica, left side: Secondary | ICD-10-CM | POA: Diagnosis not present

## 2022-03-09 DIAGNOSIS — K220 Achalasia of cardia: Secondary | ICD-10-CM | POA: Diagnosis not present

## 2022-03-15 DIAGNOSIS — M25561 Pain in right knee: Secondary | ICD-10-CM | POA: Diagnosis not present

## 2022-03-15 DIAGNOSIS — M79662 Pain in left lower leg: Secondary | ICD-10-CM | POA: Diagnosis not present

## 2022-03-15 DIAGNOSIS — M25562 Pain in left knee: Secondary | ICD-10-CM | POA: Diagnosis not present

## 2022-03-15 DIAGNOSIS — M25511 Pain in right shoulder: Secondary | ICD-10-CM | POA: Diagnosis not present

## 2022-03-18 DIAGNOSIS — M5432 Sciatica, left side: Secondary | ICD-10-CM | POA: Diagnosis not present

## 2022-03-18 DIAGNOSIS — R29898 Other symptoms and signs involving the musculoskeletal system: Secondary | ICD-10-CM | POA: Diagnosis not present

## 2022-03-18 DIAGNOSIS — M25562 Pain in left knee: Secondary | ICD-10-CM | POA: Diagnosis not present

## 2022-03-23 DIAGNOSIS — R29898 Other symptoms and signs involving the musculoskeletal system: Secondary | ICD-10-CM | POA: Diagnosis not present

## 2022-03-23 DIAGNOSIS — M25562 Pain in left knee: Secondary | ICD-10-CM | POA: Diagnosis not present

## 2022-03-23 DIAGNOSIS — M5432 Sciatica, left side: Secondary | ICD-10-CM | POA: Diagnosis not present

## 2022-03-26 ENCOUNTER — Encounter (HOSPITAL_BASED_OUTPATIENT_CLINIC_OR_DEPARTMENT_OTHER): Payer: Self-pay | Admitting: Emergency Medicine

## 2022-03-26 ENCOUNTER — Emergency Department (HOSPITAL_BASED_OUTPATIENT_CLINIC_OR_DEPARTMENT_OTHER)
Admission: EM | Admit: 2022-03-26 | Discharge: 2022-03-26 | Disposition: A | Discharge status: Home / Self Care | Payer: 59 | Attending: Emergency Medicine | Admitting: Emergency Medicine

## 2022-03-26 ENCOUNTER — Other Ambulatory Visit: Payer: Self-pay

## 2022-03-26 ENCOUNTER — Emergency Department (HOSPITAL_BASED_OUTPATIENT_CLINIC_OR_DEPARTMENT_OTHER): Payer: 59

## 2022-03-26 DIAGNOSIS — Z79899 Other long term (current) drug therapy: Secondary | ICD-10-CM | POA: Insufficient documentation

## 2022-03-26 DIAGNOSIS — U071 COVID-19: Secondary | ICD-10-CM | POA: Diagnosis not present

## 2022-03-26 DIAGNOSIS — I129 Hypertensive chronic kidney disease with stage 1 through stage 4 chronic kidney disease, or unspecified chronic kidney disease: Secondary | ICD-10-CM | POA: Insufficient documentation

## 2022-03-26 DIAGNOSIS — N189 Chronic kidney disease, unspecified: Secondary | ICD-10-CM | POA: Diagnosis not present

## 2022-03-26 DIAGNOSIS — Z7982 Long term (current) use of aspirin: Secondary | ICD-10-CM | POA: Diagnosis not present

## 2022-03-26 DIAGNOSIS — Z043 Encounter for examination and observation following other accident: Secondary | ICD-10-CM | POA: Diagnosis not present

## 2022-03-26 DIAGNOSIS — R059 Cough, unspecified: Secondary | ICD-10-CM | POA: Diagnosis present

## 2022-03-26 DIAGNOSIS — W19XXXA Unspecified fall, initial encounter: Secondary | ICD-10-CM | POA: Diagnosis not present

## 2022-03-26 LAB — COMPREHENSIVE METABOLIC PANEL
ALT: 32 U/L (ref 0–44)
AST: 43 U/L — ABNORMAL HIGH (ref 15–41)
Albumin: 3.6 g/dL (ref 3.5–5.0)
Alkaline Phosphatase: 114 U/L (ref 38–126)
Anion gap: 8 (ref 5–15)
BUN: 22 mg/dL (ref 8–23)
CO2: 24 mmol/L (ref 22–32)
Calcium: 8.6 mg/dL — ABNORMAL LOW (ref 8.9–10.3)
Chloride: 101 mmol/L (ref 98–111)
Creatinine, Ser: 1.81 mg/dL — ABNORMAL HIGH (ref 0.44–1.00)
GFR, Estimated: 28 mL/min — ABNORMAL LOW (ref >60)
Glucose, Bld: 121 mg/dL — ABNORMAL HIGH (ref 70–99)
Potassium: 4 mmol/L (ref 3.5–5.1)
Sodium: 133 mmol/L — ABNORMAL LOW (ref 135–145)
Total Bilirubin: 0.7 mg/dL (ref 0.3–1.2)
Total Protein: 7.6 g/dL (ref 6.5–8.1)

## 2022-03-26 LAB — CBC WITH DIFFERENTIAL/PLATELET
Abs Immature Granulocytes: 0.03 10*3/uL (ref 0.00–0.07)
Basophils Absolute: 0 10*3/uL (ref 0.0–0.1)
Basophils Relative: 0 %
Eosinophils Absolute: 0.1 10*3/uL (ref 0.0–0.5)
Eosinophils Relative: 2 %
HCT: 34.4 % — ABNORMAL LOW (ref 36.0–46.0)
Hemoglobin: 11 g/dL — ABNORMAL LOW (ref 12.0–15.0)
Immature Granulocytes: 0 %
Lymphocytes Relative: 28 %
Lymphs Abs: 2 10*3/uL (ref 0.7–4.0)
MCH: 27.1 pg (ref 26.0–34.0)
MCHC: 32 g/dL (ref 30.0–36.0)
MCV: 84.7 fL (ref 80.0–100.0)
Monocytes Absolute: 1.1 10*3/uL — ABNORMAL HIGH (ref 0.1–1.0)
Monocytes Relative: 15 %
Neutro Abs: 3.8 10*3/uL (ref 1.7–7.7)
Neutrophils Relative %: 55 %
Platelets: 99 10*3/uL — ABNORMAL LOW (ref 150–400)
RBC: 4.06 MIL/uL (ref 3.87–5.11)
RDW: 15.2 % (ref 11.5–15.5)
WBC: 7 10*3/uL (ref 4.0–10.5)
nRBC: 0 % (ref 0.0–0.2)

## 2022-03-26 LAB — RESP PANEL BY RT-PCR (RSV, FLU A&B, COVID)  RVPGX2
Influenza A by PCR: NEGATIVE
Influenza B by PCR: NEGATIVE
Resp Syncytial Virus by PCR: NEGATIVE
SARS Coronavirus 2 by RT PCR: POSITIVE — AB

## 2022-03-26 NOTE — ED Provider Notes (Addendum)
[facility] Provider Note   CSN: [id number] Arrival date & time: 03/26/22  1916     History  Chief Complaint  Patient presents with   Mandy Ortiz    Mandy Ortiz is a 82 y.o. female.   Patient yesterday started not feeling felt very cold she got under the warm blankets.  Today she has been kind of weak all over not holding her water is kind of running out incontinence.  Generally weak has some discomfort in her low back kind of muscle ache but has some chronic discomfort in those areas.  Patient also with cough.  As she fell 3 times today denies hitting her head denies hurting anything with the falls.  Just felt very weak.  Denies any nausea vomiting or diarrhea.  Has had chills as well.  But no documented fever temp here 99.  Oxygen saturation is 94% on room air past medical history significant for history of migraines hypertension hyperlipidemia and gastroesophageal reflux disease.  Has had a abdominal hysterectomy and appendectomy in the past patient has never used tobacco products.       Home Medications Prior to Admission medications   Medication Sig Start Date End Date Taking? Authorizing Provider  amLODipine (NORVASC) 10 MG tablet Take 1 tablet (10 mg total) by mouth daily. Patient taking differently: Take 10 mg by mouth in the morning. 05/30/14   Ghimire, Henreitta Leber, MD  aspirin 81 MG tablet Take 81 mg by mouth every morning.     [provider]  CALCIUM-VITAMIN D PO Take 1 tablet by mouth every evening.    [provider]  fluticasone (FLONASE) 50 MCG/ACT nasal spray Place 1 spray into both nostrils daily. Patient taking differently: Place 1 spray into both nostrils daily as needed for allergies or rhinitis. 04/01/21   Crain, Whitney L, PA  irbesartan (AVAPRO) 300 MG tablet Take 0.5 tablets (150 mg total) by mouth in the morning. 07/03/21   Florencia Reasons, MD  loratadine (CLARITIN) 10 MG tablet Take 1 tablet (10 mg total) by  mouth daily. 04/01/21   Crain, Whitney L, PA  pantoprazole (PROTONIX) 40 MG tablet Take 1 tablet (40 mg total) by mouth daily. Patient taking differently: Take 40 mg by mouth daily before breakfast. 05/30/14   Ghimire, Henreitta Leber, MD  polyethylene glycol (MIRALAX / GLYCOLAX) 17 g packet Take 17 g by mouth daily. 07/03/21   Florencia Reasons, MD  simvastatin (ZOCOR) 20 MG tablet Take 1 tablet (20 mg total) by mouth at bedtime. Patient taking differently: Take 20 mg by mouth every evening. 05/30/14   Ghimire, Henreitta Leber, MD  SUMAtriptan (IMITREX) 100 MG tablet Take 100 mg by mouth See admin instructions. Take 100 mg at onset of migraine and may repeat in 2 hours if headache persists or recurs (or as otherwise directed)    [provider]  triamterene-hydrochlorothiazide (MAXZIDE-25) 37.5-25 MG tablet Take 1 tablet by mouth in the morning.    [provider]      Allergies    Sulfa antibiotics    Review of Systems   Review of Systems  Constitutional:  Positive for chills. Negative for fever.  HENT:  Positive for congestion. Negative for rhinorrhea and sore throat.   Eyes:  Negative for visual disturbance.  Respiratory:  Positive for cough. Negative for shortness of breath.   Cardiovascular:  Negative for chest pain and leg swelling.  Gastrointestinal:  Negative for abdominal pain, diarrhea, nausea and vomiting.  Genitourinary:  Negative for dysuria.  Musculoskeletal:  Positive for myalgias. Negative for back pain and neck pain.  Skin:  Negative for rash.  Neurological:  Negative for dizziness, light-headedness and headaches.  Hematological:  Does not bruise/bleed easily.  Psychiatric/Behavioral:  Negative for confusion.     Physical Exam Updated Vital Signs BP (!) 149/64 (BP Location: Right Arm)   Pulse 90   Temp 99 F (37.2 C) (Oral)   Resp 18   Ht 1.676 m ('5\' 6"'$ )   Wt 63.5 kg   SpO2 94%   BMI 22.60 kg/m  Physical Exam Vitals and nursing note reviewed.  Constitutional:       General: She is not in acute distress.    Appearance: Normal appearance. She is well-developed.  HENT:     Head: Normocephalic and atraumatic.  Eyes:     Extraocular Movements: Extraocular movements intact.     Conjunctiva/sclera: Conjunctivae normal.     Pupils: Pupils are equal, round, and reactive to light.  Cardiovascular:     Rate and Rhythm: Normal rate and regular rhythm.     Heart sounds: No murmur heard. Pulmonary:     Effort: Pulmonary effort is normal. No respiratory distress.     Breath sounds: Normal breath sounds.  Abdominal:     Palpations: Abdomen is soft.     Tenderness: There is no abdominal tenderness.  Musculoskeletal:        General: No swelling.     Cervical back: Normal range of motion and neck supple.  Skin:    General: Skin is warm and dry.     Capillary Refill: Capillary refill takes less than 2 seconds.  Neurological:     General: No focal deficit present.     Mental Status: She is alert and oriented to person, place, and time.  Psychiatric:        Mood and Affect: Mood normal.     ED Results / Procedures / Treatments   Labs (all labs ordered are listed, but only abnormal results are displayed) Labs Reviewed  RESP PANEL BY RT-PCR (RSV, FLU A&B, COVID)  RVPGX2 - Abnormal; Notable for the following components:      Result Value   SARS Coronavirus 2 by RT PCR POSITIVE (*)    All other components within normal limits  COMPREHENSIVE METABOLIC PANEL  CBC WITH DIFFERENTIAL/PLATELET  URINALYSIS, ROUTINE W REFLEX MICROSCOPIC    EKG None  Radiology DG Chest Port 1 View  Result Date: 03/26/2022 CLINICAL DATA:  Fall EXAM: PORTABLE CHEST 1 VIEW COMPARISON:  03/23/2018 FINDINGS: The heart size and mediastinal contours are within normal limits. Both lungs are clear. The visualized skeletal structures are unremarkable. IMPRESSION: No active disease. Electronically Signed   By: Ulyses Jarred M.D.   On: 03/26/2022 20:18    Procedures Procedures     Medications Ordered in ED Medications - No data to display  ED Course/ Medical Decision Making/ A&P                             Medical Decision Making Amount and/or Complexity of Data Reviewed Labs: ordered. Radiology: ordered.   Patient positive for COVID on respiratory panel.  Chest x-ray without any acute findings.  Will check basic labs.  If patient's renal function is important because she qualifies for Paxlovid because onset of symptoms were just yesterday.  Patient without any significant injuries from the fall.  Also need to check urinalysis  because of the incontinence.  There is no lower extremity weakness or anything to suggest cauda equina.  Complete metabolic panel sodium 568 glucose 121 creatinine 1.18 GFR 28.  Initially have anything for comparison.  Anion gap normal.  Liver function test normal other than AST of 43.  CBC pending.  Correction in May 2023 GFR was 32 so no significant change.  Patient clearly has chronic kidney disease.  Patient's calculated creatinine clearance is 24.4.  For Paxil bid patient needs a creatinine clearance at 26 or better.  Patient not able to provide urine.  Symptomatic treatment recommended close follow-up with primary returning for any new or worse symptoms to include any respiratory problems.   Final Clinical Impression(s) / ED Diagnoses Final diagnoses:  Fall, initial encounter  COVID    Rx / DC Orders ED Discharge Orders     None         Fredia Sorrow, MD 03/26/22 2024    Fredia Sorrow, MD 03/26/22 2119    Fredia Sorrow, MD 03/26/22 2314

## 2022-03-26 NOTE — ED Notes (Signed)
Pt and family verbalized understanding of d/c instructions and follow up care. Pt A&OX4 and wheeled out of ED via wheelchair.

## 2022-03-26 NOTE — Discharge Instructions (Signed)
Workup today positive for COVID.  No signs of COVID-pneumonia oxygen saturations okay.  Based on your renal function you do not qualify for Paxlovid.  Symptomatic treatment extra strength Tylenol over-the-counter cold and flu medicines.  Returning for any trouble breathing or any new or worse symptoms.  Also recommend of small amounts of fluid frequently something with sugar or honey and it would be good.  Bland diet.

## 2022-03-26 NOTE — ED Triage Notes (Signed)
Pt sts she has fallen a few times today; "can't seem to hold my water"; chills, feeling generally weak

## 2022-03-28 DIAGNOSIS — I10 Essential (primary) hypertension: Secondary | ICD-10-CM | POA: Diagnosis not present

## 2022-03-28 DIAGNOSIS — M8580 Other specified disorders of bone density and structure, unspecified site: Secondary | ICD-10-CM | POA: Diagnosis not present

## 2022-03-28 DIAGNOSIS — D649 Anemia, unspecified: Secondary | ICD-10-CM | POA: Diagnosis not present

## 2022-03-28 DIAGNOSIS — E7800 Pure hypercholesterolemia, unspecified: Secondary | ICD-10-CM | POA: Diagnosis not present

## 2022-03-28 DIAGNOSIS — N1831 Chronic kidney disease, stage 3a: Secondary | ICD-10-CM | POA: Diagnosis not present

## 2022-04-05 DIAGNOSIS — I700 Atherosclerosis of aorta: Secondary | ICD-10-CM | POA: Diagnosis not present

## 2022-04-05 DIAGNOSIS — N1832 Chronic kidney disease, stage 3b: Secondary | ICD-10-CM | POA: Diagnosis not present

## 2022-04-05 DIAGNOSIS — U071 COVID-19: Secondary | ICD-10-CM | POA: Diagnosis not present

## 2022-04-28 DIAGNOSIS — M25562 Pain in left knee: Secondary | ICD-10-CM | POA: Diagnosis not present

## 2022-04-28 DIAGNOSIS — M25561 Pain in right knee: Secondary | ICD-10-CM | POA: Diagnosis not present

## 2022-05-12 DIAGNOSIS — M1712 Unilateral primary osteoarthritis, left knee: Secondary | ICD-10-CM | POA: Diagnosis not present

## 2022-06-03 ENCOUNTER — Encounter (HOSPITAL_BASED_OUTPATIENT_CLINIC_OR_DEPARTMENT_OTHER): Payer: Self-pay

## 2022-06-03 ENCOUNTER — Other Ambulatory Visit: Payer: Self-pay

## 2022-06-03 DIAGNOSIS — R072 Precordial pain: Secondary | ICD-10-CM | POA: Diagnosis present

## 2022-06-03 DIAGNOSIS — Z7982 Long term (current) use of aspirin: Secondary | ICD-10-CM | POA: Diagnosis not present

## 2022-06-03 DIAGNOSIS — Z79899 Other long term (current) drug therapy: Secondary | ICD-10-CM | POA: Diagnosis not present

## 2022-06-03 DIAGNOSIS — I10 Essential (primary) hypertension: Secondary | ICD-10-CM | POA: Diagnosis not present

## 2022-06-03 DIAGNOSIS — R0789 Other chest pain: Secondary | ICD-10-CM | POA: Diagnosis not present

## 2022-06-03 DIAGNOSIS — K219 Gastro-esophageal reflux disease without esophagitis: Secondary | ICD-10-CM | POA: Diagnosis not present

## 2022-06-03 LAB — CBC
HCT: 36.6 % (ref 36.0–46.0)
Hemoglobin: 11.4 g/dL — ABNORMAL LOW (ref 12.0–15.0)
MCH: 27.4 pg (ref 26.0–34.0)
MCHC: 31.1 g/dL (ref 30.0–36.0)
MCV: 88 fL (ref 80.0–100.0)
Platelets: 202 10*3/uL (ref 150–400)
RBC: 4.16 MIL/uL (ref 3.87–5.11)
RDW: 14.6 % (ref 11.5–15.5)
WBC: 6.5 10*3/uL (ref 4.0–10.5)
nRBC: 0 % (ref 0.0–0.2)

## 2022-06-03 NOTE — ED Triage Notes (Signed)
Patient arrives POV from home c/o chest pain that started on Monday 05/30/22. Patient states chest pain "is in the middle of her chest" and "goes down the right side of her back." Patient denies hx of MI, and reports no other symptoms.

## 2022-06-04 ENCOUNTER — Emergency Department (HOSPITAL_BASED_OUTPATIENT_CLINIC_OR_DEPARTMENT_OTHER)
Admission: EM | Admit: 2022-06-04 | Discharge: 2022-06-04 | Disposition: A | Discharge status: Home / Self Care | Payer: 59 | Attending: Emergency Medicine | Admitting: Emergency Medicine

## 2022-06-04 DIAGNOSIS — K219 Gastro-esophageal reflux disease without esophagitis: Secondary | ICD-10-CM

## 2022-06-04 LAB — HEPATIC FUNCTION PANEL
ALT: 18 U/L (ref 0–44)
AST: 30 U/L (ref 15–41)
Albumin: 3.6 g/dL (ref 3.5–5.0)
Alkaline Phosphatase: 98 U/L (ref 38–126)
Bilirubin, Direct: 0.2 mg/dL (ref 0.0–0.2)
Indirect Bilirubin: 0.5 mg/dL (ref 0.3–0.9)
Total Bilirubin: 0.7 mg/dL (ref 0.3–1.2)
Total Protein: 7.1 g/dL (ref 6.5–8.1)

## 2022-06-04 LAB — LIPASE, BLOOD: Lipase: 44 U/L (ref 11–51)

## 2022-06-04 LAB — BASIC METABOLIC PANEL
Anion gap: 11 (ref 5–15)
BUN: 29 mg/dL — ABNORMAL HIGH (ref 8–23)
CO2: 26 mmol/L (ref 22–32)
Calcium: 9.3 mg/dL (ref 8.9–10.3)
Chloride: 100 mmol/L (ref 98–111)
Creatinine, Ser: 1.52 mg/dL — ABNORMAL HIGH (ref 0.44–1.00)
GFR, Estimated: 34 mL/min — ABNORMAL LOW (ref >60)
Glucose, Bld: 106 mg/dL — ABNORMAL HIGH (ref 70–99)
Potassium: 3.5 mmol/L (ref 3.5–5.1)
Sodium: 137 mmol/L (ref 135–145)

## 2022-06-04 LAB — TROPONIN I (HIGH SENSITIVITY)
Troponin I (High Sensitivity): 12 ng/L (ref <18)
Troponin I (High Sensitivity): 14 ng/L (ref <18)

## 2022-06-04 MED ORDER — FAMOTIDINE IN NACL 20-0.9 MG/50ML-% IV SOLN
20.0000 mg | Freq: Once | INTRAVENOUS | Status: AC
  Administered 2022-06-04: 20 mg via INTRAVENOUS
  Filled 2022-06-04: qty 50

## 2022-06-04 MED ORDER — FAMOTIDINE 20 MG PO TABS: 20.0000 mg | ORAL_TABLET | Freq: Two times a day (BID) | ORAL | 0 refills | Status: AC

## 2022-06-04 MED ORDER — ACETAMINOPHEN 500 MG PO TABS
1000.0000 mg | ORAL_TABLET | Freq: Once | ORAL | Status: AC
  Administered 2022-06-04: 1000 mg via ORAL
  Filled 2022-06-04: qty 2

## 2022-06-04 NOTE — ED Provider Notes (Signed)
[facility] Provider Note: Lowella Dell, MD, FACEP  CSN: [id number] MRN: [id number] ARRIVAL: 06/03/22 at 2317 ROOM: MH12/[facility]12   CHIEF COMPLAINT  Chest Pain   HISTORY OF PRESENT ILLNESS  06/04/22 2:43 AM Mandy Ortiz is a 82 y.o. female with a history of GERD.  She states "my esophagus is killing me".  She has been having intermittent substernal chest pain, sometimes radiating into her back, for the past 5 days.  It is worse during the day and does not change with eating.  It tends to be present for hours at a time.  She has no associated shortness of breath, diaphoresis, nausea or vomiting.  She rates the discomfort as a 10 out of 10 and describes it as feeling like her previous GERD.  She is on Protonix but is having ongoing symptoms nevertheless.  She is also having some epigastric discomfort with this.   Past Medical History:  Diagnosis Date   GERD (gastroesophageal reflux disease)    Hiatal hernia    Hyperlipidemia    Hypertension    Migraine     Past Surgical History:  Procedure Laterality Date   ABDOMINAL HYSTERECTOMY     ABDOMINAL SURGERY     APPENDECTOMY     COLON SURGERY      Family History  Problem Relation Age of Onset   Hypertension Mother    Breast cancer Mother    Hypertension Father    Hypertension Brother    Heart attack Brother     Social History   Tobacco Use   Smoking status: Never   Smokeless tobacco: Never  Vaping Use   Vaping Use: Never used  Substance Use Topics   Alcohol use: No   Drug use: No    Prior to Admission medications   Medication Sig Start Date End Date Taking? Authorizing Provider  CALCIUM-VITAMIN D PO Take 1 tablet by mouth every evening.   Yes [provider]  famotidine (PEPCID) 20 MG tablet Take 1 tablet (20 mg total) by mouth 2 (two) times daily. 06/04/22  Yes Nuvia Hileman, MD  fluticasone (FLONASE) 50 MCG/ACT nasal spray Place 1 spray into both nostrils daily. Patient taking differently:  Place 1 spray into both nostrils daily as needed for allergies or rhinitis. 04/01/21  Yes Crain, Whitney L, PA  loratadine (CLARITIN) 10 MG tablet Take 1 tablet (10 mg total) by mouth daily. 04/01/21  Yes Crain, Whitney L, PA  pantoprazole (PROTONIX) 40 MG tablet Take 1 tablet (40 mg total) by mouth daily. Patient taking differently: Take 40 mg by mouth daily before breakfast. 05/30/14  Yes Ghimire, Werner Lean, MD  polyethylene glycol (MIRALAX / GLYCOLAX) 17 g packet Take 17 g by mouth daily. 07/03/21  Yes Albertine Grates, MD  simvastatin (ZOCOR) 20 MG tablet Take 1 tablet (20 mg total) by mouth at bedtime. Patient taking differently: Take 20 mg by mouth every evening. 05/30/14  Yes Ghimire, Werner Lean, MD  triamterene-hydrochlorothiazide (MAXZIDE-25) 37.5-25 MG tablet Take 1 tablet by mouth in the morning.   Yes [provider]  amLODipine (NORVASC) 10 MG tablet Take 1 tablet (10 mg total) by mouth daily. Patient taking differently: Take 10 mg by mouth in the morning. 05/30/14   Ghimire, Werner Lean, MD  aspirin 81 MG tablet Take 81 mg by mouth every morning.     [provider]  irbesartan (AVAPRO) 300 MG tablet Take 0.5 tablets (150 mg total) by mouth in the morning. 07/03/21   Roda Shutters,  Parke Poisson, MD  SUMAtriptan (IMITREX) 100 MG tablet Take 100 mg by mouth See admin instructions. Take 100 mg at onset of migraine and may repeat in 2 hours if headache persists or recurs (or as otherwise directed)    [provider]    Allergies Sulfa antibiotics   REVIEW OF SYSTEMS  Negative except as noted here or in the History of Present Illness.   PHYSICAL EXAMINATION  Initial Vital Signs Blood pressure (!) 154/56, pulse 69, temperature 98.2 F (36.8 C), temperature source Oral, resp. rate 20, height  (1.676 m), weight 63.5 kg, SpO2 97 %.  Examination General: Well-developed, well-nourished female in no acute distress; appearance consistent with age of record HENT: normocephalic; atraumatic Eyes:  Normal appearance Neck: supple Heart: regular rate and rhythm Lungs: clear to auscultation bilaterally Abdomen: soft; nondistended; epigastric tenderness; bowel sounds present Extremities: No deformity; full range of motion; pulses normal Neurologic: Awake, alert and oriented; motor function intact in all extremities and symmetric; no facial droop Skin: Warm and dry Psychiatric: Normal mood and affect   RESULTS  Summary of this visit's results, reviewed and interpreted by myself:   EKG Interpretation  Date/Time:  Friday June 03 2022 23:25:32 EDT Ventricular Rate:  71 PR Interval:  148 QRS Duration: 85 QT Interval:  396 QTC Calculation: 431 R Axis:   71 Text Interpretation: Sinus rhythm Atrial premature complex Probable LVH with secondary repol abnrm ST depr, consider ischemia, inferior leads Confirmed by Paula Libra ([id number]) on 06/03/2022 11:28:38 PM       Laboratory Studies: Results for orders placed or performed during the hospital encounter of 06/04/22 (from the past 24 hour(s))  Basic metabolic panel     Status: Abnormal   Collection Time: 06/03/22 11:31 PM  Result Value Ref Range   Sodium 137 135 - 145 mmol/L   Potassium 3.5 3.5 - 5.1 mmol/L   Chloride 100 98 - 111 mmol/L   CO2 26 22 - 32 mmol/L   Glucose, Bld 106 (H) 70 - 99 mg/dL   BUN 29 (H) 8 - 23 mg/dL   Creatinine, Ser 0.10 (H) 0.44 - 1.00 mg/dL   Calcium 9.3 8.9 - 27.2 mg/dL   GFR, Estimated 34 (L) >60 mL/min   Anion gap 11 5 - 15  CBC     Status: Abnormal   Collection Time: 06/03/22 11:31 PM  Result Value Ref Range   WBC 6.5 4.0 - 10.5 K/uL   RBC 4.16 3.87 - 5.11 MIL/uL   Hemoglobin 11.4 (L) 12.0 - 15.0 g/dL   HCT 53.6 64.4 - 03.4 %   MCV 88.0 80.0 - 100.0 fL   MCH 27.4 26.0 - 34.0 pg   MCHC 31.1 30.0 - 36.0 g/dL   RDW 74.2 59.5 - 63.8 %   Platelets 202 150 - 400 K/uL   nRBC 0.0 0.0 - 0.2 %  Troponin I (High Sensitivity)     Status: None   Collection Time: 06/03/22 11:31 PM  Result Value Ref  Range   Troponin I (High Sensitivity) 14 <18 ng/L  Troponin I (High Sensitivity)     Status: None   Collection Time: 06/04/22  3:04 AM  Result Value Ref Range   Troponin I (High Sensitivity) 12 <18 ng/L  Lipase, blood     Status: None   Collection Time: 06/04/22  3:04 AM  Result Value Ref Range   Lipase 44 11 - 51 U/L  Hepatic function panel     Status: None  Collection Time: 06/04/22  3:04 AM  Result Value Ref Range   Total Protein 7.1 6.5 - 8.1 g/dL   Albumin 3.6 3.5 - 5.0 g/dL   AST 30 15 - 41 U/L   ALT 18 0 - 44 U/L   Alkaline Phosphatase 98 38 - 126 U/L   Total Bilirubin 0.7 0.3 - 1.2 mg/dL   Bilirubin, Direct 0.2 0.0 - 0.2 mg/dL   Indirect Bilirubin 0.5 0.3 - 0.9 mg/dL   Imaging Studies: No results found.  ED COURSE and MDM  Nursing notes, initial and subsequent vitals signs, including pulse oximetry, reviewed and interpreted by myself.  Vitals:   06/03/22 2322 06/03/22 2324 06/04/22 0305  BP: (!) 154/56  (!) 158/69  Pulse: 69  70  Resp: 20  18  Temp: 98.2 F (36.8 C)    TempSrc: Oral    SpO2: 97%  100%  Weight:  63.5 kg   Height:  5\' 6"  (1.676 m)    Medications  acetaminophen (TYLENOL) tablet 1,000 mg (has no administration in time range)  famotidine (PEPCID) IVPB 20 mg premix (0 mg Intravenous Stopped 06/04/22 0357)   4:28 AM Patient's troponins are within normal limits and in fact downtrending.  Her EKG, while not normal, has changes consistent with previous EKGs.  Her pain is more consistent with GERD than cardiac etiology.  Symptoms have improved with IV Pepcid.  She may be getting acid breakthrough despite being on Protonix and may benefit from an additional Pepcid.   PROCEDURES  Procedures   ED DIAGNOSES     ICD-10-CM   1. Gastroesophageal reflux disease, unspecified whether esophagitis present  K21.9          Debraann Livingstone, MD 06/04/22 [phone]

## 2022-08-22 DIAGNOSIS — H25813 Combined forms of age-related cataract, bilateral: Secondary | ICD-10-CM | POA: Diagnosis not present

## 2022-08-22 DIAGNOSIS — H35372 Puckering of macula, left eye: Secondary | ICD-10-CM | POA: Diagnosis not present

## 2022-08-22 DIAGNOSIS — H526 Other disorders of refraction: Secondary | ICD-10-CM | POA: Diagnosis not present

## 2022-10-05 ENCOUNTER — Emergency Department (HOSPITAL_BASED_OUTPATIENT_CLINIC_OR_DEPARTMENT_OTHER): Payer: 59

## 2022-10-05 ENCOUNTER — Other Ambulatory Visit: Payer: Self-pay

## 2022-10-05 ENCOUNTER — Emergency Department (HOSPITAL_BASED_OUTPATIENT_CLINIC_OR_DEPARTMENT_OTHER)
Admission: EM | Admit: 2022-10-05 | Discharge: 2022-10-05 | Disposition: A | Discharge status: Home / Self Care | Payer: 59 | Attending: Emergency Medicine | Admitting: Emergency Medicine

## 2022-10-05 ENCOUNTER — Encounter (HOSPITAL_BASED_OUTPATIENT_CLINIC_OR_DEPARTMENT_OTHER): Payer: Self-pay | Admitting: Pediatrics

## 2022-10-05 DIAGNOSIS — M79641 Pain in right hand: Secondary | ICD-10-CM

## 2022-10-05 DIAGNOSIS — Z7982 Long term (current) use of aspirin: Secondary | ICD-10-CM | POA: Insufficient documentation

## 2022-10-05 DIAGNOSIS — M25531 Pain in right wrist: Secondary | ICD-10-CM | POA: Diagnosis not present

## 2022-10-05 DIAGNOSIS — I129 Hypertensive chronic kidney disease with stage 1 through stage 4 chronic kidney disease, or unspecified chronic kidney disease: Secondary | ICD-10-CM | POA: Insufficient documentation

## 2022-10-05 DIAGNOSIS — M1990 Unspecified osteoarthritis, unspecified site: Secondary | ICD-10-CM | POA: Insufficient documentation

## 2022-10-05 DIAGNOSIS — N189 Chronic kidney disease, unspecified: Secondary | ICD-10-CM | POA: Insufficient documentation

## 2022-10-05 DIAGNOSIS — M1811 Unilateral primary osteoarthritis of first carpometacarpal joint, right hand: Secondary | ICD-10-CM | POA: Diagnosis not present

## 2022-10-05 DIAGNOSIS — M7989 Other specified soft tissue disorders: Secondary | ICD-10-CM | POA: Diagnosis not present

## 2022-10-05 DIAGNOSIS — M19031 Primary osteoarthritis, right wrist: Secondary | ICD-10-CM | POA: Diagnosis not present

## 2022-10-05 MED ORDER — DICLOFENAC SODIUM 1 % EX GEL: 4.0000 g | Freq: Four times a day (QID) | CUTANEOUS | 0 refills | Status: AC

## 2022-10-05 NOTE — ED Provider Notes (Signed)
[place] [facility]PARTMENT AT [facility] Provider Note   CSN: [id number] Arrival date & time: 10/05/22  1022     History  Chief Complaint  Patient presents with   Hand Pain    Mandy Ortiz is a 82 y.o. female past medical history significant for hypertension, hyperlipidemia, acid reflux, CKD who presents with concern for acute right hand/wrist pain for 1 day.  She denies any recent injury.  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  She does not take any blood thinners.  No previous history of gout.  She has not taken anything for the pain prior to arrival.  She rates her pain 8/10.   Hand Pain       Home Medications Prior to Admission medications   Medication Sig Start Date End Date Taking? Authorizing Provider  diclofenac Sodium (VOLTAREN) 1 % GEL Apply 4 g topically 4 (four) times daily. 10/05/22  Yes ,  H, PA-C  amLODipine (NORVASC) 10 MG tablet Take 1 tablet (10 mg total) by mouth daily. Patient taking differently: Take 10 mg by mouth in the morning. 05/30/14   Ghimire, Werner Lean, MD  aspirin 81 MG tablet Take 81 mg by mouth every morning.     [provider]  CALCIUM-VITAMIN D PO Take 1 tablet by mouth every evening.    [provider]  famotidine (PEPCID) 20 MG tablet Take 1 tablet (20 mg total) by mouth 2 (two) times daily. 06/04/22   Molpus, John, MD  fluticasone (FLONASE) 50 MCG/ACT nasal spray Place 1 spray into both nostrils daily. Patient taking differently: Place 1 spray into both nostrils daily as needed for allergies or rhinitis. 04/01/21   Crain, Whitney L, PA  irbesartan (AVAPRO) 300 MG tablet Take 0.5 tablets (150 mg total) by mouth in the morning. 07/03/21   Albertine Grates, MD  loratadine (CLARITIN) 10 MG tablet Take 1 tablet (10 mg total) by mouth daily. 04/01/21   Crain, Whitney L,  PA  pantoprazole (PROTONIX) 40 MG tablet Take 1 tablet (40 mg total) by mouth daily. Patient taking differently: Take 40 mg by mouth daily before breakfast. 05/30/14   Ghimire, Werner Lean, MD  polyethylene glycol (MIRALAX / GLYCOLAX) 17 g packet Take 17 g by mouth daily. 07/03/21   Albertine Grates, MD  simvastatin (ZOCOR) 20 MG tablet Take 1 tablet (20 mg total) by mouth at bedtime. Patient taking differently: Take 20 mg by mouth every evening. 05/30/14   Ghimire, Werner Lean, MD  SUMAtriptan (IMITREX) 100 MG tablet Take 100 mg by mouth See admin instructions. Take 100 mg at onset of migraine and may repeat in 2 hours if headache persists or recurs (or as otherwise directed)    [provider]  triamterene-hydrochlorothiazide (MAXZIDE-25) 37.5-25 MG tablet Take 1 tablet by mouth in the morning.    [provider]      Allergies    Sulfa antibiotics    Review of Systems   Review of Systems  Musculoskeletal:  Positive for arthralgias.  All other systems reviewed and are negative.   Physical Exam Updated Vital Signs BP (!) 153/63 (BP Location: Left Arm)   Pulse 63   Temp 98.4 F (36.9 C) (Oral)   Resp 17   Ht 5\' 6"  (1.676 m)   Wt 63.5 kg   SpO2 97%   BMI  22.60 kg/m  Physical Exam Vitals and nursing note reviewed.  Constitutional:      General: She is not in acute distress.    Appearance: Normal appearance.  HENT:     Head: Normocephalic and atraumatic.  Eyes:     General:        Right eye: No discharge.        Left eye: No discharge.  Cardiovascular:     Rate and Rhythm: Normal rate and regular rhythm.     Pulses: Normal pulses.  Pulmonary:     Effort: Pulmonary effort is normal. No respiratory distress.  Musculoskeletal:        General: No deformity.     Comments: Soft tissue swelling, redness on the dorsum of the right hand, and pain with active and passive range of motion at wrist. No overlying fluctuance, no cyst or abscess noted. Intact strength   Skin:     General: Skin is warm and dry.     Capillary Refill: Capillary refill takes less than 2 seconds.  Neurological:     Mental Status: She is alert and oriented to person, place, and time.  Psychiatric:        Mood and Affect: Mood normal.        Behavior: Behavior normal.     ED Results / Procedures / Treatments   Labs (all labs ordered are listed, but only abnormal results are displayed) Labs Reviewed - No data to display  EKG None  Radiology DG Wrist Complete Right  Result Date: 10/05/2022 CLINICAL DATA:  Pain and swelling.  No reported history of trauma EXAM: RIGHT WRIST - COMPLETE 4 VIEW COMPARISON:  None Available. FINDINGS: No fracture or dislocation. Preserved joint spaces and bone mineralization. Only slight degenerative changes of the first carpometacarpal joint. IMPRESSION: Slight degenerative changes of the first carpometacarpal joint. Electronically Signed   By: Karen Kays M.D.   On: 10/05/2022 12:02    Procedures Procedures    Medications Ordered in ED Medications - No data to display  ED Course/ Medical Decision Making/ A&P                                 Medical Decision Making Amount and/or Complexity of Data Reviewed Radiology: ordered.   This patient is a 82 y.o. female who presents to the ED for concern of hand pain.   Differential diagnoses prior to evaluation: Arthritis, gout, pseudogout, fracture, dislocation, vs other  Past Medical History / Social History / Additional history: Chart reviewed. Pertinent results include: CKD, HTN, HLD  Physical Exam: Physical exam performed. The pertinent findings include:  Soft tissue swelling, redness on the dorsum of the right hand, and pain with active and passive range of motion at wrist. No overlying fluctuance, no cyst or abscess noted. Intact strength   Radial,ulnar pulses 2+ in affected right wrist  Medications / Treatment: Encouraged tylenol, RICE, voltaren gel, hand surgeon follow up as needed  I  independently interpreted imaging including plain film xray of right hand which shows arthritis changes of 1st MCP, otherwise no acute abnormality. I agree with the radiologist interpretation.   Disposition: After consideration of the diagnostic results and the patients response to treatment, I feel that patient is stable for discharge with plan as above .   emergency department workup does not suggest an emergent condition requiring admission or immediate intervention beyond what has been performed at this time. The  plan is: as above. The patient is safe for discharge and has been instructed to return immediately for worsening symptoms, change in symptoms or any other concerns.  Final Clinical Impression(s) / ED Diagnoses Final diagnoses:  Right hand pain  Right wrist pain  Arthritis of right wrist    Rx / DC Orders ED Discharge Orders          Ordered    diclofenac Sodium (VOLTAREN) 1 % GEL  4 times daily        10/05/22 1232              , [place], PA-C 10/05/22 1243    Jacalyn Lefevre, MD 10/06/22 1037

## 2022-10-05 NOTE — ED Notes (Signed)
Discharge paperwork reviewed entirely with patient, including follow up care. Pain was under control. The patient received instruction and coaching on their prescriptions, and all follow-up questions were answered.  Pt verbalized understanding as well as all parties involved. No questions or concerns voiced at the time of discharge. No acute distress noted.   Pt ambulated out to PVA without incident or assistance.  

## 2022-10-05 NOTE — Discharge Instructions (Addendum)
Please use Tylenol for pain.  You may use 1000 mg of Tylenol every 6 hours.  Not to exceed 4 g of Tylenol within 24 hours.  Recommend, ice, compression, elevation of the affected extremity.  Please follow-up with a hand surgeon whose contact information I provided if you are not having significant improvement of pain after 1 to 2 weeks.  I prescribed a new medication, Voltaren gel which is an anti-inflammatory to apply directly where the pain is 4 times a day until symptoms resolve.

## 2022-10-05 NOTE — ED Triage Notes (Signed)
C/O swelling and pain on right hand / wrist area, denies any recent injury.

## 2022-10-12 DIAGNOSIS — G43009 Migraine without aura, not intractable, without status migrainosus: Secondary | ICD-10-CM | POA: Diagnosis not present

## 2022-10-12 DIAGNOSIS — E7800 Pure hypercholesterolemia, unspecified: Secondary | ICD-10-CM | POA: Diagnosis not present

## 2022-10-12 DIAGNOSIS — I700 Atherosclerosis of aorta: Secondary | ICD-10-CM | POA: Diagnosis not present

## 2022-10-12 DIAGNOSIS — I10 Essential (primary) hypertension: Secondary | ICD-10-CM | POA: Diagnosis not present

## 2022-10-12 DIAGNOSIS — Z79899 Other long term (current) drug therapy: Secondary | ICD-10-CM | POA: Diagnosis not present

## 2022-10-12 DIAGNOSIS — Z0001 Encounter for general adult medical examination with abnormal findings: Secondary | ICD-10-CM | POA: Diagnosis not present

## 2022-10-12 DIAGNOSIS — N1831 Chronic kidney disease, stage 3a: Secondary | ICD-10-CM | POA: Diagnosis not present

## 2022-10-12 DIAGNOSIS — D649 Anemia, unspecified: Secondary | ICD-10-CM | POA: Diagnosis not present

## 2022-10-18 ENCOUNTER — Other Ambulatory Visit: Payer: Self-pay | Admitting: Family Medicine

## 2022-10-18 DIAGNOSIS — E2839 Other primary ovarian failure: Secondary | ICD-10-CM

## 2023-02-21 DIAGNOSIS — R079 Chest pain, unspecified: Secondary | ICD-10-CM | POA: Diagnosis not present

## 2023-02-21 DIAGNOSIS — E785 Hyperlipidemia, unspecified: Secondary | ICD-10-CM | POA: Diagnosis not present

## 2023-02-21 DIAGNOSIS — I129 Hypertensive chronic kidney disease with stage 1 through stage 4 chronic kidney disease, or unspecified chronic kidney disease: Secondary | ICD-10-CM | POA: Diagnosis not present

## 2023-02-21 DIAGNOSIS — R42 Dizziness and giddiness: Secondary | ICD-10-CM | POA: Diagnosis not present

## 2023-02-21 DIAGNOSIS — I498 Other specified cardiac arrhythmias: Secondary | ICD-10-CM | POA: Diagnosis not present

## 2023-02-21 DIAGNOSIS — R7989 Other specified abnormal findings of blood chemistry: Secondary | ICD-10-CM | POA: Diagnosis not present

## 2023-02-21 DIAGNOSIS — Z7409 Other reduced mobility: Secondary | ICD-10-CM | POA: Diagnosis not present

## 2023-02-21 DIAGNOSIS — R2681 Unsteadiness on feet: Secondary | ICD-10-CM | POA: Diagnosis not present

## 2023-02-21 DIAGNOSIS — M542 Cervicalgia: Secondary | ICD-10-CM | POA: Diagnosis not present

## 2023-02-21 DIAGNOSIS — Z791 Long term (current) use of non-steroidal anti-inflammatories (NSAID): Secondary | ICD-10-CM | POA: Diagnosis not present

## 2023-02-21 DIAGNOSIS — R110 Nausea: Secondary | ICD-10-CM | POA: Diagnosis not present

## 2023-02-21 DIAGNOSIS — Z79899 Other long term (current) drug therapy: Secondary | ICD-10-CM | POA: Diagnosis not present

## 2023-02-21 DIAGNOSIS — R0789 Other chest pain: Secondary | ICD-10-CM | POA: Diagnosis not present

## 2023-02-21 DIAGNOSIS — K219 Gastro-esophageal reflux disease without esophagitis: Secondary | ICD-10-CM | POA: Diagnosis not present

## 2023-02-21 DIAGNOSIS — H538 Other visual disturbances: Secondary | ICD-10-CM | POA: Diagnosis not present

## 2023-02-21 DIAGNOSIS — R519 Headache, unspecified: Secondary | ICD-10-CM | POA: Diagnosis not present

## 2023-02-21 DIAGNOSIS — R55 Syncope and collapse: Secondary | ICD-10-CM | POA: Diagnosis not present

## 2023-02-21 DIAGNOSIS — N189 Chronic kidney disease, unspecified: Secondary | ICD-10-CM | POA: Diagnosis not present

## 2023-02-22 DIAGNOSIS — R42 Dizziness and giddiness: Secondary | ICD-10-CM | POA: Diagnosis not present

## 2023-02-22 DIAGNOSIS — Z7409 Other reduced mobility: Secondary | ICD-10-CM | POA: Diagnosis not present

## 2023-02-22 DIAGNOSIS — R55 Syncope and collapse: Secondary | ICD-10-CM | POA: Diagnosis not present

## 2023-03-27 DIAGNOSIS — J208 Acute bronchitis due to other specified organisms: Secondary | ICD-10-CM | POA: Diagnosis not present

## 2023-03-27 DIAGNOSIS — Z79899 Other long term (current) drug therapy: Secondary | ICD-10-CM | POA: Diagnosis not present

## 2023-03-27 DIAGNOSIS — Z20822 Contact with and (suspected) exposure to covid-19: Secondary | ICD-10-CM | POA: Diagnosis not present

## 2023-03-27 DIAGNOSIS — R9431 Abnormal electrocardiogram [ECG] [EKG]: Secondary | ICD-10-CM | POA: Diagnosis not present

## 2023-03-27 DIAGNOSIS — R531 Weakness: Secondary | ICD-10-CM | POA: Diagnosis not present

## 2023-03-27 DIAGNOSIS — Z1152 Encounter for screening for COVID-19: Secondary | ICD-10-CM | POA: Diagnosis not present

## 2023-03-27 DIAGNOSIS — B9689 Other specified bacterial agents as the cause of diseases classified elsewhere: Secondary | ICD-10-CM | POA: Diagnosis not present

## 2023-03-27 DIAGNOSIS — K219 Gastro-esophageal reflux disease without esophagitis: Secondary | ICD-10-CM | POA: Diagnosis not present

## 2023-03-27 DIAGNOSIS — I10 Essential (primary) hypertension: Secondary | ICD-10-CM | POA: Diagnosis not present

## 2023-04-19 DIAGNOSIS — I700 Atherosclerosis of aorta: Secondary | ICD-10-CM | POA: Diagnosis not present

## 2023-04-19 DIAGNOSIS — E7800 Pure hypercholesterolemia, unspecified: Secondary | ICD-10-CM | POA: Diagnosis not present

## 2023-04-19 DIAGNOSIS — Z23 Encounter for immunization: Secondary | ICD-10-CM | POA: Diagnosis not present

## 2023-04-19 DIAGNOSIS — I10 Essential (primary) hypertension: Secondary | ICD-10-CM | POA: Diagnosis not present

## 2023-04-19 DIAGNOSIS — N1831 Chronic kidney disease, stage 3a: Secondary | ICD-10-CM | POA: Diagnosis not present

## 2023-04-19 DIAGNOSIS — D649 Anemia, unspecified: Secondary | ICD-10-CM | POA: Diagnosis not present

## 2023-04-19 DIAGNOSIS — Z79899 Other long term (current) drug therapy: Secondary | ICD-10-CM | POA: Diagnosis not present

## 2023-04-19 DIAGNOSIS — G43009 Migraine without aura, not intractable, without status migrainosus: Secondary | ICD-10-CM | POA: Diagnosis not present

## 2023-07-18 ENCOUNTER — Inpatient Hospital Stay: Admission: RE | Admit: 2023-07-18 | Payer: 59 | Source: Ambulatory Visit
